# Patient Record
Sex: Female | Born: 1935
Health system: Southern US, Community
[De-identification: ages and names within clinical notes are randomized; demographics above are authoritative.]

## PROBLEM LIST (undated history)

## (undated) DIAGNOSIS — M1990 Unspecified osteoarthritis, unspecified site: Secondary | ICD-10-CM

## (undated) DIAGNOSIS — I10 Essential (primary) hypertension: Secondary | ICD-10-CM

## (undated) HISTORY — PX: TOTAL HIP ARTHROPLASTY: SHX124

---

## 2005-11-07 ENCOUNTER — Ambulatory Visit (HOSPITAL_COMMUNITY): Admission: RE | Admit: 2005-11-07 | Discharge: 2005-11-07 | Payer: Self-pay | Admitting: Internal Medicine

## 2005-12-09 ENCOUNTER — Ambulatory Visit (HOSPITAL_COMMUNITY): Admission: RE | Admit: 2005-12-09 | Discharge: 2005-12-09 | Payer: Self-pay | Admitting: Internal Medicine

## 2005-12-09 ENCOUNTER — Ambulatory Visit: Payer: Self-pay | Admitting: Internal Medicine

## 2006-05-05 HISTORY — PX: JOINT REPLACEMENT: SHX530

## 2006-11-19 ENCOUNTER — Ambulatory Visit (HOSPITAL_COMMUNITY): Admission: RE | Admit: 2006-11-19 | Discharge: 2006-11-19 | Payer: Self-pay | Admitting: Internal Medicine

## 2007-11-24 ENCOUNTER — Ambulatory Visit (HOSPITAL_COMMUNITY): Admission: RE | Admit: 2007-11-24 | Discharge: 2007-11-24 | Payer: Self-pay | Admitting: Internal Medicine

## 2008-01-17 ENCOUNTER — Other Ambulatory Visit: Admission: RE | Admit: 2008-01-17 | Discharge: 2008-01-17 | Payer: Self-pay | Admitting: Obstetrics & Gynecology

## 2008-11-27 ENCOUNTER — Ambulatory Visit (HOSPITAL_COMMUNITY): Admission: RE | Admit: 2008-11-27 | Discharge: 2008-11-27 | Payer: Self-pay | Admitting: Internal Medicine

## 2009-02-26 ENCOUNTER — Other Ambulatory Visit: Admission: RE | Admit: 2009-02-26 | Discharge: 2009-02-26 | Payer: Self-pay | Admitting: Obstetrics and Gynecology

## 2010-05-26 ENCOUNTER — Encounter: Payer: Self-pay | Admitting: Internal Medicine

## 2010-09-20 NOTE — Op Note (Signed)
NAME:  Carol Gutierrez, Carol Gutierrez             ACCOUNT NO.:  [id number]   MEDICAL RECORD NO.:  [id number]          PATIENT TYPE:  AMB   LOCATION:  DAY                           FACILITY:  [facility]   PHYSICIAN:  Lionel December, M.D.    DATE OF BIRTH:  10-22-1935   DATE OF PROCEDURE:  12/09/2005  DATE OF DISCHARGE:                                 OPERATIVE REPORT   PROCEDURE:  Colonoscopy.   INDICATIONS:  [name] is 75 year old Caucasian female who is undergoing  surveillance colonoscopy.  She has had two colonoscopies but 5 years ago six  weeks apart for removal of adenomatous polyp or polyps elsewhere.  She is  presently free of GI symptoms.  Family history is significant for colon  carcinoma in two sisters, one was in her early 20s and she is doing well and  the other sister was younger.   Procedure risks were reviewed with the patient, informed consent was  obtained.   MEDS FOR CONSCIOUS SEDATION:  Demerol 25 mg IV, Versed 4 mg IV.   FINDINGS:  Procedure performed in endoscopy suite.  The patient's vital  signs and O2 sat were monitored during procedure and remained stable.  The  patient was placed left lateral position and rectal examination performed.  No abnormality noted external or digital exam.  Olympus videoscope was  placed rectum and advanced under vision into sigmoid colon and beyond.  Preparation was excellent.  Few tiny diverticula were noted at sigmoid and  descending colon.  The scope was passed into cecum which was identified by  appendiceal orifice and ileocecal valve.  Pictures taken for the record.  As  the scope was withdrawn, colonic mucosa was examined for the second time and  was normal throughout.  Rectal mucosa similarly was normal.  Scope was  retroflexed to examine anorectal junction which was unremarkable.  Endoscope  was then withdrawn.  The patient tolerated the procedure well.   FINAL DIAGNOSIS:  Few tiny diverticula at sigmoid and descending colon.   No evidence  of recurrent polyps.   RECOMMENDATIONS:  High-fiber diet.  Yearly Hemoccults.  She should return  for next colonoscopy in 5 years from now.      Lionel December, M.D.  Electronically Signed     NR/MEDQ  D:  12/09/2005  T:  12/09/2005  Job:  [id number]   cc:   Kingsley Callander. Ouida Sills, MD  Fax: [phone]

## 2010-12-11 ENCOUNTER — Encounter (INDEPENDENT_AMBULATORY_CARE_PROVIDER_SITE_OTHER): Payer: Self-pay | Admitting: *Deleted

## 2010-12-19 ENCOUNTER — Ambulatory Visit (HOSPITAL_COMMUNITY)
Admission: RE | Admit: 2010-12-19 | Discharge: 2010-12-19 | Disposition: A | Discharge status: Home / Self Care | Payer: Medicare Other | Source: Ambulatory Visit | Attending: Internal Medicine | Admitting: Internal Medicine

## 2010-12-19 ENCOUNTER — Other Ambulatory Visit (HOSPITAL_COMMUNITY): Payer: Self-pay | Admitting: Internal Medicine

## 2010-12-19 DIAGNOSIS — M899 Disorder of bone, unspecified: Secondary | ICD-10-CM | POA: Insufficient documentation

## 2010-12-19 DIAGNOSIS — T148XXA Other injury of unspecified body region, initial encounter: Secondary | ICD-10-CM

## 2010-12-19 DIAGNOSIS — S59909A Unspecified injury of unspecified elbow, initial encounter: Secondary | ICD-10-CM | POA: Insufficient documentation

## 2010-12-19 DIAGNOSIS — S6990XA Unspecified injury of unspecified wrist, hand and finger(s), initial encounter: Secondary | ICD-10-CM | POA: Insufficient documentation

## 2010-12-19 DIAGNOSIS — W19XXXA Unspecified fall, initial encounter: Secondary | ICD-10-CM

## 2010-12-19 DIAGNOSIS — M25539 Pain in unspecified wrist: Secondary | ICD-10-CM | POA: Insufficient documentation

## 2010-12-19 DIAGNOSIS — M949 Disorder of cartilage, unspecified: Secondary | ICD-10-CM | POA: Insufficient documentation

## 2010-12-24 ENCOUNTER — Other Ambulatory Visit (HOSPITAL_COMMUNITY): Payer: Self-pay | Admitting: Internal Medicine

## 2010-12-24 DIAGNOSIS — Z139 Encounter for screening, unspecified: Secondary | ICD-10-CM

## 2011-01-02 ENCOUNTER — Ambulatory Visit (HOSPITAL_COMMUNITY)
Admission: RE | Admit: 2011-01-02 | Discharge: 2011-01-02 | Disposition: A | Discharge status: Home / Self Care | Payer: Medicare Other | Source: Ambulatory Visit | Attending: Internal Medicine | Admitting: Internal Medicine

## 2011-01-02 DIAGNOSIS — Z139 Encounter for screening, unspecified: Secondary | ICD-10-CM

## 2011-01-02 DIAGNOSIS — Z1231 Encounter for screening mammogram for malignant neoplasm of breast: Secondary | ICD-10-CM | POA: Insufficient documentation

## 2011-02-10 ENCOUNTER — Other Ambulatory Visit (HOSPITAL_COMMUNITY)
Admission: RE | Admit: 2011-02-10 | Discharge: 2011-02-10 | Disposition: A | Discharge status: Home / Self Care | Payer: Medicare Other | Source: Ambulatory Visit | Attending: Obstetrics and Gynecology | Admitting: Obstetrics and Gynecology

## 2011-02-10 ENCOUNTER — Other Ambulatory Visit: Payer: Self-pay | Admitting: Adult Health

## 2011-02-10 DIAGNOSIS — Z124 Encounter for screening for malignant neoplasm of cervix: Secondary | ICD-10-CM | POA: Insufficient documentation

## 2011-03-06 ENCOUNTER — Ambulatory Visit (INDEPENDENT_AMBULATORY_CARE_PROVIDER_SITE_OTHER): Payer: Medicare Other | Admitting: Otolaryngology

## 2011-03-06 DIAGNOSIS — K121 Other forms of stomatitis: Secondary | ICD-10-CM

## 2011-06-06 DIAGNOSIS — M999 Biomechanical lesion, unspecified: Secondary | ICD-10-CM | POA: Diagnosis not present

## 2011-06-06 DIAGNOSIS — M5030 Other cervical disc degeneration, unspecified cervical region: Secondary | ICD-10-CM | POA: Diagnosis not present

## 2011-06-06 DIAGNOSIS — M9981 Other biomechanical lesions of cervical region: Secondary | ICD-10-CM | POA: Diagnosis not present

## 2011-06-06 DIAGNOSIS — IMO0002 Reserved for concepts with insufficient information to code with codable children: Secondary | ICD-10-CM | POA: Diagnosis not present

## 2011-07-04 DIAGNOSIS — M5030 Other cervical disc degeneration, unspecified cervical region: Secondary | ICD-10-CM | POA: Diagnosis not present

## 2011-07-04 DIAGNOSIS — M999 Biomechanical lesion, unspecified: Secondary | ICD-10-CM | POA: Diagnosis not present

## 2011-07-04 DIAGNOSIS — IMO0002 Reserved for concepts with insufficient information to code with codable children: Secondary | ICD-10-CM | POA: Diagnosis not present

## 2011-07-04 DIAGNOSIS — M9981 Other biomechanical lesions of cervical region: Secondary | ICD-10-CM | POA: Diagnosis not present

## 2011-08-08 DIAGNOSIS — R079 Chest pain, unspecified: Secondary | ICD-10-CM | POA: Diagnosis not present

## 2011-08-12 ENCOUNTER — Other Ambulatory Visit (HOSPITAL_COMMUNITY): Payer: Self-pay | Admitting: Internal Medicine

## 2011-08-13 ENCOUNTER — Ambulatory Visit (HOSPITAL_COMMUNITY): Admission: RE | Admit: 2011-08-13 | Payer: Medicare Other | Source: Ambulatory Visit

## 2011-08-13 ENCOUNTER — Encounter (HOSPITAL_COMMUNITY): Payer: Medicare Other

## 2011-08-14 ENCOUNTER — Encounter (HOSPITAL_COMMUNITY): Payer: Medicare Other

## 2011-08-20 ENCOUNTER — Encounter (HOSPITAL_COMMUNITY): Payer: Self-pay | Admitting: Internal Medicine

## 2011-08-20 ENCOUNTER — Encounter (HOSPITAL_COMMUNITY)
Admission: RE | Admit: 2011-08-20 | Discharge: 2011-08-20 | Disposition: A | Discharge status: Home / Self Care | Payer: Medicare Other | Source: Ambulatory Visit | Attending: Internal Medicine | Admitting: Internal Medicine

## 2011-08-20 ENCOUNTER — Encounter (HOSPITAL_COMMUNITY): Payer: Medicare Other

## 2011-08-20 ENCOUNTER — Encounter (HOSPITAL_COMMUNITY): Payer: Self-pay

## 2011-08-20 ENCOUNTER — Ambulatory Visit (HOSPITAL_COMMUNITY)
Admission: RE | Admit: 2011-08-20 | Discharge: 2011-08-20 | Disposition: A | Discharge status: Home / Self Care | Payer: Medicare Other | Source: Ambulatory Visit | Attending: Internal Medicine | Admitting: Internal Medicine

## 2011-08-20 DIAGNOSIS — R079 Chest pain, unspecified: Secondary | ICD-10-CM | POA: Insufficient documentation

## 2011-08-20 HISTORY — DX: Essential (primary) hypertension: I10

## 2011-08-20 MED ORDER — TECHNETIUM TC 99M TETROFOSMIN IV KIT
10.0000 | PACK | Freq: Once | INTRAVENOUS | Status: AC | PRN
  Administered 2011-08-20: 10 via INTRAVENOUS

## 2011-08-20 MED ORDER — TECHNETIUM TC 99M TETROFOSMIN IV KIT
30.0000 | PACK | Freq: Once | INTRAVENOUS | Status: AC | PRN
  Administered 2011-08-20: 30 via INTRAVENOUS

## 2011-08-20 NOTE — Progress Notes (Signed)
NAMEITHA, KROEKER NO.:  [id number]  MEDICAL RECORD NO.:  [id number]  LOCATION:  [facility]                          FACILITY:  [facility]  PHYSICIAN:  Kingsley Callander. Ouida Sills, MD       DATE OF BIRTH:  1935-06-25  DATE OF PROCEDURE: DATE OF DISCHARGE:                                PROGRESS NOTE   Ms. Snedeker underwent a Myoview stress test for evaluation of recent symptoms of chest pain.  She exercised 7 minutes 22 seconds (1 minute 22 seconds into stage III of the Bruce protocol) attaining a maximal heart rate of 132 (90% of the age predicted maximal heart rate) at a workload of 10.1 mets and discontinued exercise due to shortness of breath. There were no symptoms of chest pain.  There were no arrhythmias.  She had minor ST-segment changes in late recovery in the inferolateral leads.  There was a hypertensive blood pressure response to exercise reaching a peak of 220/82 in early recovery.  The baseline EKG revealed normal sinus rhythm/sinus bradycardia at 49 beats per minute.  IMPRESSION:  No definite evidence of exercise-induced ischemia.  Myoview images pending.     Kingsley Callander. Ouida Sills, MD     ROF/MEDQ  D:  08/20/2011  T:  08/20/2011  Job:  [id number]

## 2011-08-20 NOTE — Progress Notes (Signed)
Stress Lab Nurses Notes - Carol Gutierrez  Carol Gutierrez 08/20/2011 Reason for doing test: Chest Pain Type of test: Stress Myoview / one day study Nurse performing test: Parke Poisson, RN Nuclear Medicine Tech: Lyndel Pleasure Echo Tech: Not Applicable MD performing test: R. Ouida Sills Family MD: Ouida Sills Test explained and consent signed: yes IV started: 22g jelco, Saline lock flushed, IV in progress from floor and Saline lock started in radiology Symptoms: mild SOB & fatigue Treatment/Intervention: None Reason test stopped: fatigue After recovery IV was: Discontinued via X-ray tech and No redness or edema Patient to return to Nuc. Med at : 13:20 Patient discharged: Home Patient's Condition upon discharge was: stable Comments: During test peak BP 220/82 & HR 132.  Recovery BP 152/78 & HR 74.  Symptoms resolved in recovery. Erskine Speed T

## 2011-08-21 ENCOUNTER — Ambulatory Visit (HOSPITAL_COMMUNITY): Payer: Medicare Other

## 2011-08-21 ENCOUNTER — Encounter (HOSPITAL_COMMUNITY): Payer: Medicare Other

## 2011-08-22 ENCOUNTER — Encounter (HOSPITAL_COMMUNITY): Payer: Medicare Other

## 2011-08-25 DIAGNOSIS — Z79899 Other long term (current) drug therapy: Secondary | ICD-10-CM | POA: Diagnosis not present

## 2011-08-25 DIAGNOSIS — E785 Hyperlipidemia, unspecified: Secondary | ICD-10-CM | POA: Diagnosis not present

## 2011-08-25 DIAGNOSIS — I10 Essential (primary) hypertension: Secondary | ICD-10-CM | POA: Diagnosis not present

## 2011-09-01 DIAGNOSIS — R079 Chest pain, unspecified: Secondary | ICD-10-CM | POA: Diagnosis not present

## 2011-09-01 DIAGNOSIS — I10 Essential (primary) hypertension: Secondary | ICD-10-CM | POA: Diagnosis not present

## 2011-09-01 DIAGNOSIS — E785 Hyperlipidemia, unspecified: Secondary | ICD-10-CM | POA: Diagnosis not present

## 2012-03-04 DIAGNOSIS — I10 Essential (primary) hypertension: Secondary | ICD-10-CM | POA: Diagnosis not present

## 2012-08-02 ENCOUNTER — Telehealth (INDEPENDENT_AMBULATORY_CARE_PROVIDER_SITE_OTHER): Payer: Self-pay | Admitting: *Deleted

## 2012-08-02 ENCOUNTER — Other Ambulatory Visit (INDEPENDENT_AMBULATORY_CARE_PROVIDER_SITE_OTHER): Payer: Self-pay | Admitting: *Deleted

## 2012-08-02 DIAGNOSIS — Z86010 Personal history of colonic polyps: Secondary | ICD-10-CM

## 2012-08-02 DIAGNOSIS — Z1211 Encounter for screening for malignant neoplasm of colon: Secondary | ICD-10-CM

## 2012-08-02 DIAGNOSIS — Z800 Family history of malignant neoplasm of digestive organs: Secondary | ICD-10-CM

## 2012-08-02 NOTE — Telephone Encounter (Signed)
Patient needs movi prep 

## 2012-08-03 MED ORDER — PEG-KCL-NACL-NASULF-NA ASC-C 100 G PO SOLR: 1.0000 | Freq: Once | ORAL | Status: DC

## 2012-08-11 ENCOUNTER — Telehealth (INDEPENDENT_AMBULATORY_CARE_PROVIDER_SITE_OTHER): Payer: Self-pay | Admitting: *Deleted

## 2012-08-11 NOTE — Telephone Encounter (Signed)
  Procedure: tcs  Reason/Indication:  Hx polyps, fam hx colon ca  Has patient had this procedure before?  Yes, 12/2005 (EPIC)  If so, when, by whom and where?    Is there a family history of colon cancer?  Yes, 2 sisters  Who?  What age when diagnosed?    Is patient diabetic?   no      Does patient have prosthetic heart valve?  no  Do you have a pacemaker?  no  Has patient ever had endocarditis? no  Has patient had joint replacement within last 12 months?  no  Is patient on Coumadin, Plavix and/or Aspirin? no  Medications: non4  Allergies: nkda  Medication Adjustment:   Procedure date & time: 09/08/12 at 830

## 2012-08-11 NOTE — Telephone Encounter (Signed)
  Procedure: tcs  Reason/Indication:  Hx polyps, fam hx colon ca  Has patient had this procedure before?  Yes, 12/2005 (EPIC)  If so, when, by whom and where?    Is there a family history of colon cancer?  Yes, 2 sisters  Who?  What age when diagnosed?    Is patient diabetic?   no      Does patient have prosthetic heart valve?  no  Do you have a pacemaker?  no  Has patient ever had endocarditis? no  Has patient had joint replacement within last 12 months?  no  Is patient on Coumadin, Plavix and/or Aspirin? no  Medications: none  Allergies: nkda  Medication Adjustment:   Procedure date & time: 09/08/12 at 830

## 2012-08-12 NOTE — Telephone Encounter (Signed)
agree

## 2012-08-30 ENCOUNTER — Encounter (HOSPITAL_COMMUNITY): Payer: Self-pay | Admitting: Pharmacy Technician

## 2012-09-02 DIAGNOSIS — Z79899 Other long term (current) drug therapy: Secondary | ICD-10-CM | POA: Diagnosis not present

## 2012-09-02 DIAGNOSIS — E785 Hyperlipidemia, unspecified: Secondary | ICD-10-CM | POA: Diagnosis not present

## 2012-09-02 DIAGNOSIS — I10 Essential (primary) hypertension: Secondary | ICD-10-CM | POA: Diagnosis not present

## 2012-09-02 DIAGNOSIS — M1990 Unspecified osteoarthritis, unspecified site: Secondary | ICD-10-CM | POA: Diagnosis not present

## 2012-09-06 DIAGNOSIS — Z79899 Other long term (current) drug therapy: Secondary | ICD-10-CM | POA: Diagnosis not present

## 2012-09-06 DIAGNOSIS — E785 Hyperlipidemia, unspecified: Secondary | ICD-10-CM | POA: Diagnosis not present

## 2012-09-06 DIAGNOSIS — M1990 Unspecified osteoarthritis, unspecified site: Secondary | ICD-10-CM | POA: Diagnosis not present

## 2012-09-06 DIAGNOSIS — I10 Essential (primary) hypertension: Secondary | ICD-10-CM | POA: Diagnosis not present

## 2012-09-08 ENCOUNTER — Encounter (HOSPITAL_COMMUNITY)
Admission: RE | Disposition: A | Discharge status: Home / Self Care | Payer: Self-pay | Source: Ambulatory Visit | Attending: Internal Medicine

## 2012-09-08 ENCOUNTER — Ambulatory Visit (HOSPITAL_COMMUNITY)
Admission: RE | Admit: 2012-09-08 | Discharge: 2012-09-08 | Disposition: A | Discharge status: Home / Self Care | Payer: Medicare Other | Source: Ambulatory Visit | Attending: Internal Medicine | Admitting: Internal Medicine

## 2012-09-08 ENCOUNTER — Encounter (HOSPITAL_COMMUNITY): Payer: Self-pay | Admitting: *Deleted

## 2012-09-08 DIAGNOSIS — Z860100 Personal history of colon polyps, unspecified: Secondary | ICD-10-CM | POA: Insufficient documentation

## 2012-09-08 DIAGNOSIS — K644 Residual hemorrhoidal skin tags: Secondary | ICD-10-CM | POA: Insufficient documentation

## 2012-09-08 DIAGNOSIS — Z800 Family history of malignant neoplasm of digestive organs: Secondary | ICD-10-CM

## 2012-09-08 DIAGNOSIS — D126 Benign neoplasm of colon, unspecified: Secondary | ICD-10-CM | POA: Insufficient documentation

## 2012-09-08 DIAGNOSIS — K5520 Angiodysplasia of colon without hemorrhage: Secondary | ICD-10-CM | POA: Diagnosis not present

## 2012-09-08 DIAGNOSIS — K5730 Diverticulosis of large intestine without perforation or abscess without bleeding: Secondary | ICD-10-CM | POA: Diagnosis not present

## 2012-09-08 DIAGNOSIS — I10 Essential (primary) hypertension: Secondary | ICD-10-CM | POA: Insufficient documentation

## 2012-09-08 DIAGNOSIS — K6389 Other specified diseases of intestine: Secondary | ICD-10-CM

## 2012-09-08 HISTORY — DX: Unspecified osteoarthritis, unspecified site: M19.90

## 2012-09-08 HISTORY — PX: COLONOSCOPY: SHX5424

## 2012-09-08 SURGERY — COLONOSCOPY
Anesthesia: Moderate Sedation

## 2012-09-08 MED ORDER — MEPERIDINE HCL 50 MG/ML IJ SOLN
INTRAMUSCULAR | Status: DC | PRN
  Administered 2012-09-08: 25 mg via INTRAVENOUS

## 2012-09-08 MED ORDER — MIDAZOLAM HCL 5 MG/5ML IJ SOLN
INTRAMUSCULAR | Status: AC
  Filled 2012-09-08: qty 10

## 2012-09-08 MED ORDER — MIDAZOLAM HCL 5 MG/5ML IJ SOLN
INTRAMUSCULAR | Status: DC | PRN
  Administered 2012-09-08: 2 mg via INTRAVENOUS

## 2012-09-08 MED ORDER — SODIUM CHLORIDE 0.9 % IV SOLN
INTRAVENOUS | Status: DC
  Administered 2012-09-08: 1000 mL via INTRAVENOUS

## 2012-09-08 MED ORDER — MEPERIDINE HCL 50 MG/ML IJ SOLN
INTRAMUSCULAR | Status: AC
  Filled 2012-09-08: qty 1

## 2012-09-08 NOTE — H&P (Signed)
Carol Gutierrez is an 77 y.o. female.   Chief Complaint: Patient is here for colonoscopy. HPI: Patient is a 77 year old Caucasian female with history of colonic adenomas and is for colonoscopy. Patient's last exam was in August 2007. She denies abdominal pain change in bowel habits or rectal bleeding. Family history significant for colon carcinoma in her sister in her 28s and she is doing well.  Past Medical History  Diagnosis Date  . Hypertension   . Arthritis     hands and back    Past Surgical History  Procedure Laterality Date  . Joint replacement  2008    left hip    Family History  Problem Relation Age of Onset  . Arthritis Mother   . Diabetes type II Father   . CAD Father   . Colon cancer Sister   . Hypertension Brother    Social History:  reports that she has never smoked. She does not have any smokeless tobacco history on file. She reports that  drinks alcohol. She reports that she does not use illicit drugs.  Allergies: No Known Allergies  Medications Prior to Admission  Medication Sig Dispense Refill  . aspirin 325 MG tablet Take 325 mg by mouth as needed for pain.      . peg 3350 powder (MOVIPREP) 100 G SOLR Take 1 kit (100 g total) by mouth once.  1 kit  0    No results found for this or any previous visit (from the past 48 hour(s)). No results found.  ROS  Blood pressure 166/86, pulse 63, temperature 98.1 F (36.7 C), temperature source Oral, resp. rate 18, height 5\' 3"  (1.6 m), weight 144 lb 8 oz (65.545 kg), SpO2 97.00%. Physical Exam  Constitutional: She appears well-developed and well-nourished.  HENT:  Mouth/Throat: Oropharynx is clear and moist.  Eyes: Conjunctivae are normal. No scleral icterus.  Neck: No thyromegaly present.  Cardiovascular: Normal rate, regular rhythm and normal heart sounds.   No murmur heard. Respiratory: Effort normal and breath sounds normal.  GI: Soft. She exhibits no distension and no mass. There is no tenderness.   Musculoskeletal: She exhibits no edema.  Lymphadenopathy:    She has no cervical adenopathy.  Neurological: She is alert.  Skin: Skin is warm and dry.     Assessment/Plan History of colonic adenomas. Family history of colon carcinoma. Surveillance colonoscopy.  Evalette Montrose U 09/08/2012, 8:22 AM

## 2012-09-08 NOTE — Op Note (Signed)
COLONOSCOPY PROCEDURE REPORT  PATIENT:  Carol Gutierrez  MR#:  [id number] Birthdate:  Jun 17, 1935, 77 y.o., female Endoscopist:  Dr. Malissa Hippo, MD Referred By:  Dr. Carylon Perches, MD  Procedure Date: 09/08/2012  Procedure:   Colonoscopy  Indications:  Patient is a 77 year old Caucasian female with history of colonic adenomas and family history of colon carcinoma in a sister. Patient's last exam was in August 2007.  Informed Consent:  The procedure and risks were reviewed with the patient and informed consent was obtained.  Medications:  Demerol 25 mg IV Versed 3 mg IV  Description of procedure:  After a digital rectal exam was performed, that colonoscope was advanced from the anus through the rectum and colon to the area of the cecum, ileocecal valve and appendiceal orifice. The cecum was deeply intubated. These structures were well-seen and photographed for the record. From the level of the cecum and ileocecal valve, the scope was slowly and cautiously withdrawn. The mucosal surfaces were carefully surveyed utilizing scope tip to flexion to facilitate fold flattening as needed. The scope was pulled down into the rectum where a thorough exam including retroflexion was performed.  Findings:   Prep excellent. 2 small polyps that were cold biopsied from cecum. Lobular ileocecal valve possibly secondary to prolapsed mucosa. Biopsy was taken for histologic confirmation. 8-9 mm submucosal lipoma at ascending colon. Single small AV malformation in the hepatic flexure. Moderate number of diverticula at the sigmoid colon. Normal rectal mucosa. Small hemorrhoids below the dentate line and single anal papilla.    Therapeutic/Diagnostic Maneuvers Performed:  See above  Complications:  None  Cecal Withdrawal Time:  19 minutes  Impression:  Examination performed to cecum. Two small cecal polyps ablated via cold biopsy and submitted together. Lobulated appearance of ileocecal valve possibly  secondary to prolapsed mucosa. Biopsy taken for histologic confirmation. Moderate number of diverticula and sigmoid colon. Small external hemorrhoids and single anal papilla.  Recommendations:  Standard instructions given. High fiber diet. I will contact patient with biopsy results and further recommendations.  Talha Iser U  09/08/2012 9:04 AM  CC: Dr. Carylon Perches, MD & Dr. Bonnetta Barry ref. provider found

## 2012-09-10 ENCOUNTER — Encounter (HOSPITAL_COMMUNITY): Payer: Self-pay | Admitting: Internal Medicine

## 2012-09-14 ENCOUNTER — Encounter (INDEPENDENT_AMBULATORY_CARE_PROVIDER_SITE_OTHER): Payer: Self-pay | Admitting: *Deleted

## 2012-09-14 DIAGNOSIS — E785 Hyperlipidemia, unspecified: Secondary | ICD-10-CM | POA: Diagnosis not present

## 2012-09-14 DIAGNOSIS — M1990 Unspecified osteoarthritis, unspecified site: Secondary | ICD-10-CM | POA: Diagnosis not present

## 2012-09-14 DIAGNOSIS — D352 Benign neoplasm of pituitary gland: Secondary | ICD-10-CM | POA: Diagnosis not present

## 2012-12-22 DIAGNOSIS — H2510 Age-related nuclear cataract, unspecified eye: Secondary | ICD-10-CM | POA: Diagnosis not present

## 2013-01-11 DIAGNOSIS — H2510 Age-related nuclear cataract, unspecified eye: Secondary | ICD-10-CM | POA: Diagnosis not present

## 2013-01-13 DIAGNOSIS — H2510 Age-related nuclear cataract, unspecified eye: Secondary | ICD-10-CM | POA: Diagnosis not present

## 2013-01-13 DIAGNOSIS — H52209 Unspecified astigmatism, unspecified eye: Secondary | ICD-10-CM | POA: Diagnosis not present

## 2013-01-13 DIAGNOSIS — H269 Unspecified cataract: Secondary | ICD-10-CM | POA: Diagnosis not present

## 2013-03-08 DIAGNOSIS — H26499 Other secondary cataract, unspecified eye: Secondary | ICD-10-CM | POA: Diagnosis not present

## 2013-03-08 DIAGNOSIS — Z961 Presence of intraocular lens: Secondary | ICD-10-CM | POA: Diagnosis not present

## 2013-03-08 DIAGNOSIS — H538 Other visual disturbances: Secondary | ICD-10-CM | POA: Diagnosis not present

## 2013-03-08 DIAGNOSIS — H524 Presbyopia: Secondary | ICD-10-CM | POA: Diagnosis not present

## 2013-03-08 DIAGNOSIS — H2510 Age-related nuclear cataract, unspecified eye: Secondary | ICD-10-CM | POA: Diagnosis not present

## 2013-03-08 DIAGNOSIS — H35039 Hypertensive retinopathy, unspecified eye: Secondary | ICD-10-CM | POA: Diagnosis not present

## 2013-03-22 DIAGNOSIS — H269 Unspecified cataract: Secondary | ICD-10-CM | POA: Diagnosis not present

## 2013-03-22 DIAGNOSIS — H2510 Age-related nuclear cataract, unspecified eye: Secondary | ICD-10-CM | POA: Diagnosis not present

## 2013-07-16 DIAGNOSIS — H40019 Open angle with borderline findings, low risk, unspecified eye: Secondary | ICD-10-CM | POA: Diagnosis not present

## 2013-07-16 DIAGNOSIS — D3190 Benign neoplasm of unspecified part of unspecified eye: Secondary | ICD-10-CM | POA: Diagnosis not present

## 2013-10-21 DIAGNOSIS — H35039 Hypertensive retinopathy, unspecified eye: Secondary | ICD-10-CM | POA: Diagnosis not present

## 2013-10-21 DIAGNOSIS — I7090 Unspecified atherosclerosis: Secondary | ICD-10-CM | POA: Diagnosis not present

## 2013-10-21 DIAGNOSIS — H40019 Open angle with borderline findings, low risk, unspecified eye: Secondary | ICD-10-CM | POA: Diagnosis not present

## 2013-10-21 DIAGNOSIS — Z961 Presence of intraocular lens: Secondary | ICD-10-CM | POA: Diagnosis not present

## 2013-11-03 DIAGNOSIS — H26499 Other secondary cataract, unspecified eye: Secondary | ICD-10-CM | POA: Diagnosis not present

## 2014-04-07 DIAGNOSIS — I10 Essential (primary) hypertension: Secondary | ICD-10-CM | POA: Diagnosis not present

## 2014-04-07 DIAGNOSIS — Z79899 Other long term (current) drug therapy: Secondary | ICD-10-CM | POA: Diagnosis not present

## 2014-04-07 DIAGNOSIS — E785 Hyperlipidemia, unspecified: Secondary | ICD-10-CM | POA: Diagnosis not present

## 2014-04-14 ENCOUNTER — Other Ambulatory Visit (HOSPITAL_COMMUNITY): Payer: Self-pay | Admitting: Internal Medicine

## 2014-04-14 DIAGNOSIS — Z23 Encounter for immunization: Secondary | ICD-10-CM | POA: Diagnosis not present

## 2014-04-14 DIAGNOSIS — Z1231 Encounter for screening mammogram for malignant neoplasm of breast: Secondary | ICD-10-CM

## 2014-04-14 DIAGNOSIS — E785 Hyperlipidemia, unspecified: Secondary | ICD-10-CM | POA: Diagnosis not present

## 2014-04-14 DIAGNOSIS — M19049 Primary osteoarthritis, unspecified hand: Secondary | ICD-10-CM | POA: Diagnosis not present

## 2014-04-24 ENCOUNTER — Ambulatory Visit (HOSPITAL_COMMUNITY)
Admission: RE | Admit: 2014-04-24 | Discharge: 2014-04-24 | Disposition: A | Discharge status: Home / Self Care | Payer: Medicare Other | Source: Ambulatory Visit | Attending: Internal Medicine | Admitting: Internal Medicine

## 2014-04-24 DIAGNOSIS — Z1231 Encounter for screening mammogram for malignant neoplasm of breast: Secondary | ICD-10-CM | POA: Insufficient documentation

## 2014-05-08 DIAGNOSIS — H5711 Ocular pain, right eye: Secondary | ICD-10-CM | POA: Diagnosis not present

## 2014-05-08 DIAGNOSIS — H40013 Open angle with borderline findings, low risk, bilateral: Secondary | ICD-10-CM | POA: Diagnosis not present

## 2014-05-08 DIAGNOSIS — Z961 Presence of intraocular lens: Secondary | ICD-10-CM | POA: Diagnosis not present

## 2014-05-08 DIAGNOSIS — H26492 Other secondary cataract, left eye: Secondary | ICD-10-CM | POA: Diagnosis not present

## 2014-08-15 DIAGNOSIS — H01003 Unspecified blepharitis right eye, unspecified eyelid: Secondary | ICD-10-CM | POA: Diagnosis not present

## 2014-08-15 DIAGNOSIS — H01006 Unspecified blepharitis left eye, unspecified eyelid: Secondary | ICD-10-CM | POA: Diagnosis not present

## 2014-08-15 DIAGNOSIS — H40013 Open angle with borderline findings, low risk, bilateral: Secondary | ICD-10-CM | POA: Diagnosis not present

## 2014-11-30 ENCOUNTER — Emergency Department (HOSPITAL_COMMUNITY): Payer: Medicare Other

## 2014-11-30 ENCOUNTER — Encounter (HOSPITAL_COMMUNITY): Payer: Self-pay

## 2014-11-30 ENCOUNTER — Emergency Department (HOSPITAL_COMMUNITY)
Admission: EM | Admit: 2014-11-30 | Discharge: 2014-11-30 | Disposition: A | Discharge status: Other Acute Inpatient Hospital | Payer: Medicare Other | Attending: Emergency Medicine | Admitting: Emergency Medicine

## 2014-11-30 DIAGNOSIS — Z7982 Long term (current) use of aspirin: Secondary | ICD-10-CM | POA: Diagnosis not present

## 2014-11-30 DIAGNOSIS — S199XXA Unspecified injury of neck, initial encounter: Secondary | ICD-10-CM | POA: Diagnosis not present

## 2014-11-30 DIAGNOSIS — S82142A Displaced bicondylar fracture of left tibia, initial encounter for closed fracture: Secondary | ICD-10-CM

## 2014-11-30 DIAGNOSIS — S0990XA Unspecified injury of head, initial encounter: Secondary | ICD-10-CM | POA: Diagnosis not present

## 2014-11-30 DIAGNOSIS — S8265XA Nondisplaced fracture of lateral malleolus of left fibula, initial encounter for closed fracture: Secondary | ICD-10-CM | POA: Diagnosis not present

## 2014-11-30 DIAGNOSIS — M1990 Unspecified osteoarthritis, unspecified site: Secondary | ICD-10-CM | POA: Insufficient documentation

## 2014-11-30 DIAGNOSIS — S0181XA Laceration without foreign body of other part of head, initial encounter: Secondary | ICD-10-CM | POA: Insufficient documentation

## 2014-11-30 DIAGNOSIS — S82832A Other fracture of upper and lower end of left fibula, initial encounter for closed fracture: Secondary | ICD-10-CM | POA: Diagnosis not present

## 2014-11-30 DIAGNOSIS — W19XXXA Unspecified fall, initial encounter: Secondary | ICD-10-CM

## 2014-11-30 DIAGNOSIS — W01198A Fall on same level from slipping, tripping and stumbling with subsequent striking against other object, initial encounter: Secondary | ICD-10-CM | POA: Insufficient documentation

## 2014-11-30 DIAGNOSIS — Y9389 Activity, other specified: Secondary | ICD-10-CM | POA: Insufficient documentation

## 2014-11-30 DIAGNOSIS — S8262XA Displaced fracture of lateral malleolus of left fibula, initial encounter for closed fracture: Secondary | ICD-10-CM | POA: Diagnosis present

## 2014-11-30 DIAGNOSIS — S82102A Unspecified fracture of upper end of left tibia, initial encounter for closed fracture: Secondary | ICD-10-CM | POA: Diagnosis not present

## 2014-11-30 DIAGNOSIS — Y9289 Other specified places as the place of occurrence of the external cause: Secondary | ICD-10-CM | POA: Insufficient documentation

## 2014-11-30 DIAGNOSIS — S42292A Other displaced fracture of upper end of left humerus, initial encounter for closed fracture: Secondary | ICD-10-CM | POA: Diagnosis not present

## 2014-11-30 DIAGNOSIS — S299XXA Unspecified injury of thorax, initial encounter: Secondary | ICD-10-CM | POA: Diagnosis not present

## 2014-11-30 DIAGNOSIS — S79912A Unspecified injury of left hip, initial encounter: Secondary | ICD-10-CM | POA: Diagnosis not present

## 2014-11-30 DIAGNOSIS — Z96642 Presence of left artificial hip joint: Secondary | ICD-10-CM | POA: Diagnosis not present

## 2014-11-30 DIAGNOSIS — S82202A Unspecified fracture of shaft of left tibia, initial encounter for closed fracture: Secondary | ICD-10-CM | POA: Insufficient documentation

## 2014-11-30 DIAGNOSIS — I10 Essential (primary) hypertension: Secondary | ICD-10-CM | POA: Diagnosis present

## 2014-11-30 DIAGNOSIS — M7989 Other specified soft tissue disorders: Secondary | ICD-10-CM | POA: Diagnosis not present

## 2014-11-30 DIAGNOSIS — Z9989 Dependence on other enabling machines and devices: Secondary | ICD-10-CM | POA: Diagnosis not present

## 2014-11-30 DIAGNOSIS — T148 Other injury of unspecified body region: Secondary | ICD-10-CM | POA: Diagnosis not present

## 2014-11-30 DIAGNOSIS — M25552 Pain in left hip: Secondary | ICD-10-CM | POA: Diagnosis not present

## 2014-11-30 DIAGNOSIS — Y998 Other external cause status: Secondary | ICD-10-CM | POA: Insufficient documentation

## 2014-11-30 DIAGNOSIS — S42252A Displaced fracture of greater tuberosity of left humerus, initial encounter for closed fracture: Secondary | ICD-10-CM | POA: Diagnosis not present

## 2014-11-30 DIAGNOSIS — S42202A Unspecified fracture of upper end of left humerus, initial encounter for closed fracture: Secondary | ICD-10-CM | POA: Diagnosis present

## 2014-11-30 DIAGNOSIS — S82252A Displaced comminuted fracture of shaft of left tibia, initial encounter for closed fracture: Secondary | ICD-10-CM | POA: Diagnosis not present

## 2014-11-30 DIAGNOSIS — S42302A Unspecified fracture of shaft of humerus, left arm, initial encounter for closed fracture: Secondary | ICD-10-CM

## 2014-11-30 DIAGNOSIS — S42352A Displaced comminuted fracture of shaft of humerus, left arm, initial encounter for closed fracture: Secondary | ICD-10-CM | POA: Diagnosis not present

## 2014-11-30 DIAGNOSIS — Z043 Encounter for examination and observation following other accident: Secondary | ICD-10-CM | POA: Diagnosis not present

## 2014-11-30 DIAGNOSIS — D62 Acute posthemorrhagic anemia: Secondary | ICD-10-CM | POA: Diagnosis not present

## 2014-11-30 DIAGNOSIS — Z9981 Dependence on supplemental oxygen: Secondary | ICD-10-CM | POA: Diagnosis not present

## 2014-11-30 DIAGNOSIS — Z01810 Encounter for preprocedural cardiovascular examination: Secondary | ICD-10-CM | POA: Diagnosis not present

## 2014-11-30 DIAGNOSIS — M25512 Pain in left shoulder: Secondary | ICD-10-CM | POA: Diagnosis not present

## 2014-11-30 LAB — CBC
HEMATOCRIT: 35.2 % — AB (ref 36.0–46.0)
Hemoglobin: 11.4 g/dL — ABNORMAL LOW (ref 12.0–15.0)
MCH: 26.2 pg (ref 26.0–34.0)
MCHC: 32.4 g/dL (ref 30.0–36.0)
MCV: 80.9 fL (ref 78.0–100.0)
PLATELETS: 176 10*3/uL (ref 150–400)
RBC: 4.35 MIL/uL (ref 3.87–5.11)
RDW: 14.2 % (ref 11.5–15.5)
WBC: 11.4 10*3/uL — ABNORMAL HIGH (ref 4.0–10.5)

## 2014-11-30 LAB — BASIC METABOLIC PANEL
Anion gap: 10 (ref 5–15)
BUN: 14 mg/dL (ref 6–20)
CALCIUM: 8.7 mg/dL — AB (ref 8.9–10.3)
CHLORIDE: 102 mmol/L (ref 101–111)
CO2: 25 mmol/L (ref 22–32)
Creatinine, Ser: 0.95 mg/dL (ref 0.44–1.00)
GFR calc non Af Amer: 57 mL/min — ABNORMAL LOW (ref >60)
GLUCOSE: 143 mg/dL — AB (ref 65–99)
Potassium: 3.5 mmol/L (ref 3.5–5.1)
SODIUM: 137 mmol/L (ref 135–145)

## 2014-11-30 LAB — TYPE AND SCREEN
ABO/RH(D): A POS
Antibody Screen: NEGATIVE

## 2014-11-30 MED ORDER — FENTANYL CITRATE (PF) 100 MCG/2ML IJ SOLN
50.0000 ug | Freq: Once | INTRAMUSCULAR | Status: AC
  Administered 2014-11-30: 50 ug via INTRAVENOUS
  Filled 2014-11-30: qty 2

## 2014-11-30 MED ORDER — ONDANSETRON HCL 4 MG/2ML IJ SOLN: 4.0000 mg | Freq: Once | INTRAMUSCULAR | Status: DC

## 2014-11-30 MED ORDER — ONDANSETRON HCL 4 MG/2ML IJ SOLN
INTRAMUSCULAR | Status: AC
  Filled 2014-11-30: qty 2

## 2014-11-30 MED ORDER — FENTANYL CITRATE (PF) 100 MCG/2ML IJ SOLN
25.0000 ug | Freq: Once | INTRAMUSCULAR | Status: AC
  Administered 2014-11-30: 25 ug via INTRAVENOUS

## 2014-11-30 MED ORDER — ONDANSETRON HCL 4 MG/2ML IJ SOLN: 4.0000 mg | Freq: Once | INTRAMUSCULAR | Status: AC

## 2014-11-30 MED ORDER — ONDANSETRON HCL 4 MG/2ML IJ SOLN
INTRAMUSCULAR | Status: AC
Start: 2014-11-30 — End: 2014-11-30
  Administered 2014-11-30: 4 mg via INTRAVENOUS
  Filled 2014-11-30: qty 2

## 2014-11-30 MED ORDER — LIDOCAINE-EPINEPHRINE (PF) 1 %-1:200000 IJ SOLN
10.0000 mL | Freq: Once | INTRAMUSCULAR | Status: DC
  Filled 2014-11-30: qty 10

## 2014-11-30 MED ORDER — FENTANYL CITRATE (PF) 100 MCG/2ML IJ SOLN
INTRAMUSCULAR | Status: AC
  Filled 2014-11-30: qty 2

## 2014-11-30 MED ORDER — MORPHINE SULFATE 4 MG/ML IJ SOLN
2.0000 mg | Freq: Once | INTRAMUSCULAR | Status: AC
  Administered 2014-11-30: 2 mg via INTRAVENOUS
  Filled 2014-11-30: qty 1

## 2014-11-30 NOTE — ED Notes (Signed)
Spoke with [facility] emergency dept at [place], report given

## 2014-11-30 NOTE — ED Provider Notes (Signed)
CSN: [id number]     Arrival date & time 11/30/14  1538 History   First MD Initiated Contact with Patient 11/30/14 1538     Chief Complaint  Patient presents with  . Fall     (Consider location/radiation/quality/duration/timing/severity/associated sxs/prior Treatment) HPI  Pt tripped and fell just pta at store on sidewalk - fell and struck her L shoulder, L hip and L knee and had too much pain to get up by herself - was transported by EMS with BB and CC - she had mild bleeding from Lac to the L forehead - but no LOC, no neck pain, no numbness or weakness and no seizure / n/v.  She has pain in the L knee, L hip and L arm - worse with ROM.  She is UTD on tdap.  Sx are constant,. Moderate and worse with ROM.  Past Medical History  Diagnosis Date  . Hypertension   . Arthritis     hands and back   Past Surgical History  Procedure Laterality Date  . Joint replacement  2008    left hip  . Colonoscopy N/A 09/08/2012    Procedure: COLONOSCOPY;  Surgeon: Rogene Houston, MD;  Location: [facility];  Service: Endoscopy;  Laterality: N/A;  730-moved to 830 Ann to notify pt  . Total hip arthroplasty Left Left Hip Replacement   Family History  Problem Relation Age of Onset  . Arthritis Mother   . Diabetes type II Father   . CAD Father   . Colon cancer Sister   . Hypertension Brother    History  Substance Use Topics  . Smoking status: Never Smoker   . Smokeless tobacco: Not on file  . Alcohol Use: Yes     Comment: rare   OB History    No data available     Review of Systems  All other systems reviewed and are negative.     Allergies  Review of patient's allergies indicates no known allergies.  Home Medications   Prior to Admission medications   Medication Sig Start Date End Date Taking? Authorizing Provider  aspirin 325 MG tablet Take 325 mg by mouth as needed for mild pain or moderate pain.    Yes Historical Provider, MD   BP 154/87 mmHg  Pulse 71  Temp(Src) 98.1 F  (36.7 C) (Oral)  Resp 20  Ht 5\' 3"  (1.6 m)  Wt 147 lb (66.679 kg)  BMI 26.05 kg/m2  SpO2 94% Physical Exam  Constitutional: She appears well-developed and well-nourished. No distress.  HENT:  Head: Normocephalic.  Mouth/Throat: Oropharynx is clear and moist. No oropharyngeal exudate.  Laceration to the L forehead - 2 cm in length.  Minimal bleeding, no underlying FB or vessels / nerves seen.  Hematoma to the forehead in the hairline  Eyes: Conjunctivae and EOM are normal. Pupils are equal, round, and reactive to light. Right eye exhibits no discharge. Left eye exhibits no discharge. No scleral icterus.  Neck: Normal range of motion. Neck supple. No JVD present. No thyromegaly present.  FROM of the neck without any pain - no ttp over the posterior neck.  Cardiovascular: Normal rate, regular rhythm, normal heart sounds and intact distal pulses.  Exam reveals no gallop and no friction rub.   No murmur heard. Pulmonary/Chest: Effort normal and breath sounds normal. No respiratory distress. She has no wheezes. She has no rales.  Abdominal: Soft. Bowel sounds are normal. She exhibits no distension and no mass. There is no tenderness.  Musculoskeletal: She exhibits tenderness ( ttp over teh L prox humerus and the L knee - can straight leg raise.  Dec ROM of the L shoulder, knee and ankle - normal ROM of L hip.). She exhibits no edema.  Lymphadenopathy:    She has no cervical adenopathy.  Neurological: She is alert. Coordination normal.  Able to straight leg raise on L but with severe knee pain.  Normal speech, coordination and sensation.  Normal motor in the RUE and RLE.  Skin: Skin is warm and dry. No erythema.  Laceration as described in HENT  Psychiatric: She has a normal mood and affect. Her behavior is normal.  Nursing note and vitals reviewed.   ED Course  Procedures (including critical care time) Labs Review Labs Reviewed  CBC - Abnormal; Notable for the following:    WBC 11.4 (*)     Hemoglobin 11.4 (*)    HCT 35.2 (*)    All other components within normal limits  BASIC METABOLIC PANEL - Abnormal; Notable for the following:    Glucose, Bld 143 (*)    Calcium 8.7 (*)    GFR calc non Af Amer 57 (*)    All other components within normal limits  TYPE AND SCREEN    Imaging Review Dg Tibia/fibula Left  11/30/2014   CLINICAL DATA:  Status post fall today with left knee pain  EXAM: LEFT TIBIA AND FIBULA - 2 VIEW  COMPARISON:  None.  FINDINGS: There is comminuted intra-articular displaced fracture of the proximal medial tibia. There is fracture of the distal fibula.  IMPRESSION: Comminuted displaced intra-articular fracture of the proximal medial tibia.  Fracture of distal fibula.   Electronically Signed   By: Abelardo Diesel M.D.   On: 11/30/2014 17:08   Dg Ankle Complete Left  11/30/2014   CLINICAL DATA:  Left ankle pain after tripping and falling on a side wall today.  EXAM: LEFT ANKLE COMPLETE - 3+ VIEW  COMPARISON:  None.  FINDINGS: Vertically oriented linear lucency in the proximal lateral malleolus, laterally. No tibia fracture and no dislocation. There is an ankle joint effusion. Also noted is mild diffuse soft tissue swelling.  IMPRESSION: Nondisplaced lateral malleolus fracture and small effusion.   Electronically Signed   By: Claudie Revering M.D.   On: 11/30/2014 17:07   Ct Head Wo Contrast  11/30/2014   CLINICAL DATA:  79 year old female with history of trauma from a fall over a sidewalk, with laceration overlying the left eye. No associated loss of consciousness. No neck pain.  EXAM: CT HEAD WITHOUT CONTRAST  CT CERVICAL SPINE WITHOUT CONTRAST  TECHNIQUE: Multidetector CT imaging of the head and cervical spine was performed following the standard protocol without intravenous contrast. Multiplanar CT image reconstructions of the cervical spine were also generated.  COMPARISON:  No priors.  FINDINGS: CT HEAD FINDINGS  Small amount of high attenuation material in the left  frontal scalp, compatible with reported scalp laceration/ hematoma. No acute displaced skull fractures are identified. No acute intracranial abnormality. Specifically, no evidence of acute post-traumatic intracranial hemorrhage, no definite regions of acute/subacute cerebral ischemia, no focal mass, mass effect, hydrocephalus or abnormal intra or extra-axial fluid collections. Tiny well-defined focus of low attenuation in the left insula, compatible with an old lacunar infarct. Physiologic calcifications in the basal ganglia bilaterally. The visualized paranasal sinuses and mastoids are generally well pneumatized. 11 mm lesion in the posterior aspect of the right maxillary sinus is compatible with a mucosal retention cyst or polyp.  CT CERVICAL SPINE FINDINGS  No acute displaced fractures in the cervical spine. Exaggeration of normal cervical lordosis, particularly at the level of C3. Alignment is otherwise anatomic. Prevertebral soft tissues are normal. Multilevel degenerative disc disease, most severe at C5-C6 and C6-C7. Severe multilevel facet arthropathy. Visualized portions of the upper thorax are unremarkable.  IMPRESSION: 1. Left frontal scalp laceration/hematoma. No underlying displaced skull fracture and no findings of significant acute intracranial trauma. 2. No evidence of significant acute traumatic injury to the cervical spine. 3. Multilevel degenerative disc disease and cervical spondylosis, as above. 4. Old lacunar infarct in the left insula.   Electronically Signed   By: Vinnie Langton M.D.   On: 11/30/2014 18:30   Ct Cervical Spine Wo Contrast  11/30/2014   CLINICAL DATA:  79 year old female with history of trauma from a fall over a sidewalk, with laceration overlying the left eye. No associated loss of consciousness. No neck pain.  EXAM: CT HEAD WITHOUT CONTRAST  CT CERVICAL SPINE WITHOUT CONTRAST  TECHNIQUE: Multidetector CT imaging of the head and cervical spine was performed following the  standard protocol without intravenous contrast. Multiplanar CT image reconstructions of the cervical spine were also generated.  COMPARISON:  No priors.  FINDINGS: CT HEAD FINDINGS  Small amount of high attenuation material in the left frontal scalp, compatible with reported scalp laceration/ hematoma. No acute displaced skull fractures are identified. No acute intracranial abnormality. Specifically, no evidence of acute post-traumatic intracranial hemorrhage, no definite regions of acute/subacute cerebral ischemia, no focal mass, mass effect, hydrocephalus or abnormal intra or extra-axial fluid collections. Tiny well-defined focus of low attenuation in the left insula, compatible with an old lacunar infarct. Physiologic calcifications in the basal ganglia bilaterally. The visualized paranasal sinuses and mastoids are generally well pneumatized. 11 mm lesion in the posterior aspect of the right maxillary sinus is compatible with a mucosal retention cyst or polyp.  CT CERVICAL SPINE FINDINGS  No acute displaced fractures in the cervical spine. Exaggeration of normal cervical lordosis, particularly at the level of C3. Alignment is otherwise anatomic. Prevertebral soft tissues are normal. Multilevel degenerative disc disease, most severe at C5-C6 and C6-C7. Severe multilevel facet arthropathy. Visualized portions of the upper thorax are unremarkable.  IMPRESSION: 1. Left frontal scalp laceration/hematoma. No underlying displaced skull fracture and no findings of significant acute intracranial trauma. 2. No evidence of significant acute traumatic injury to the cervical spine. 3. Multilevel degenerative disc disease and cervical spondylosis, as above. 4. Old lacunar infarct in the left insula.   Electronically Signed   By: Vinnie Langton M.D.   On: 11/30/2014 18:30   Dg Knee Complete 4 Views Left  11/30/2014   CLINICAL DATA:  Tripped on sidewalk yesterday and fell. Left knee pain. Initial encounter.  EXAM: LEFT KNEE  - COMPLETE 4+ VIEW  COMPARISON:  None.  FINDINGS: A comminuted and depressed fracture of the lateral tibial plateau is seen, with extension inferiorly into the lateral tibial metaphysis. No other fractures are identified. Generalized osteopenia noted. Small knee joint effusion also demonstrated. Mild degenerative spurring is seen involving the medial and patellofemoral compartments.  IMPRESSION: Comminuted and depressed lateral tibial plateau fracture.  Osteopenia.  Small knee joint effusion.  Mild medial and patellofemoral compartment degenerative spurring.   Electronically Signed   By: Earle Gell M.D.   On: 11/30/2014 17:07   Dg Humerus Left  11/30/2014   CLINICAL DATA:  Tripped and fell onto a sidewalk today.  EXAM: LEFT HUMERUS - 2+ VIEW  COMPARISON:  None.  FINDINGS: There is a comminuted proximal left humeral fracture, comprised of an oblique transverse component across the surgical neck and vertical components across the greater tuberosity and also up to the articular surface where there is a mild step-off. There is no dislocation. There is no bone lesion or bony destruction to suggest a pathologic basis for the fracture. There is mild impaction and slight displacement about the fractures.  IMPRESSION: Comminuted proximal left humeral fracture.  No dislocation.   Electronically Signed   By: Andreas Newport M.D.   On: 11/30/2014 17:07   Dg Hip Unilat With Pelvis 2-3 Views Left  11/30/2014   CLINICAL DATA:  Status post fall today with left hip pain  EXAM: DG HIP (WITH OR WITHOUT PELVIS) 2-3V LEFT  COMPARISON:  None.  FINDINGS: There is no evidence of hip fracture or dislocation. Left hip replacement is identified. There are degenerative joint changes of right hip with marked narrowed joint space and osteophyte formation.  IMPRESSION: No acute fracture or dislocation.   Electronically Signed   By: Abelardo Diesel M.D.   On: 11/30/2014 17:06      MDM   Final diagnoses:  Fall  Head injury   Fracture, tibial plateau, left, closed, initial encounter  Humerus fracture, left, closed, initial encounter    Image to r/o fractures Primary repair of facial laceration TDAP UTD. Mechanical fall - no headache and no LOC, minor - no indication for head or neck imaging.  LACERATION REPAIR Performed by: Johnna Acosta Authorized by: Johnna Acosta Consent: Verbal consent obtained. Risks and benefits: risks, benefits and alternatives were discussed Consent given by: patient Patient identity confirmed: provided demographic data Prepped and Draped in normal sterile fashion Wound explored  Laceration Location: L forehead  Laceration Length: 2cm  No Foreign Bodies seen or palpated  Anesthesia: local infiltration  Local anesthetic: lidocaine 1% with epinephrine  Anesthetic total: 1 ml - supraorbital nerve block  Irrigation method: syringe Amount of cleaning: standard  Skin closure: 6-0 prolene  Number of sutures: 3  Technique: Simple interrupted  Patient tolerance: Patient tolerated the procedure well with no immediate complications.   Multiple fractures - ankle, knee and shoulder  I have personally viewed and interpreted the imaging and agree with radiologist interpretation.  Paged ortho.  D/w Dr. Alvan Dame - he recommends sending to trauma center at [facility] -   D/w Dr. Noland Fordyce in ED and Dr. Eilleen Kempf with Trauma service who has accepted pt.  Appreciate consulting physicians.  Pt in agreement  Lac repaired, pain controled, VS stable, labs unremarkable.  Noemi Chapel, MD 11/30/14 [phone]

## 2014-11-30 NOTE — ED Notes (Signed)
Brought in by [facility]. States she was at [employer] and tripped over side walk. Reports of left knee, left hip, left shoulder pain. Laceration noted over left eye. Denies neck/back pain. Denies LOC. C-colar in place on arrival via EMS.

## 2014-11-30 NOTE — ED Notes (Signed)
Transport here.

## 2014-12-08 DIAGNOSIS — I63511 Cerebral infarction due to unspecified occlusion or stenosis of right middle cerebral artery: Secondary | ICD-10-CM | POA: Diagnosis not present

## 2014-12-08 DIAGNOSIS — I10 Essential (primary) hypertension: Secondary | ICD-10-CM | POA: Diagnosis not present

## 2014-12-08 DIAGNOSIS — I69322 Dysarthria following cerebral infarction: Secondary | ICD-10-CM | POA: Diagnosis not present

## 2014-12-08 DIAGNOSIS — S42202D Unspecified fracture of upper end of left humerus, subsequent encounter for fracture with routine healing: Secondary | ICD-10-CM | POA: Diagnosis not present

## 2014-12-08 DIAGNOSIS — R531 Weakness: Secondary | ICD-10-CM | POA: Diagnosis not present

## 2014-12-08 DIAGNOSIS — G451 Carotid artery syndrome (hemispheric): Secondary | ICD-10-CM | POA: Diagnosis not present

## 2014-12-08 DIAGNOSIS — I517 Cardiomegaly: Secondary | ICD-10-CM | POA: Diagnosis not present

## 2014-12-08 DIAGNOSIS — S82142D Displaced bicondylar fracture of left tibia, subsequent encounter for closed fracture with routine healing: Secondary | ICD-10-CM | POA: Diagnosis not present

## 2014-12-08 DIAGNOSIS — G4700 Insomnia, unspecified: Secondary | ICD-10-CM | POA: Diagnosis present

## 2014-12-08 DIAGNOSIS — R471 Dysarthria and anarthria: Secondary | ICD-10-CM | POA: Diagnosis present

## 2014-12-08 DIAGNOSIS — D509 Iron deficiency anemia, unspecified: Secondary | ICD-10-CM | POA: Diagnosis not present

## 2014-12-08 DIAGNOSIS — G459 Transient cerebral ischemic attack, unspecified: Secondary | ICD-10-CM | POA: Diagnosis not present

## 2014-12-08 DIAGNOSIS — I69392 Facial weakness following cerebral infarction: Secondary | ICD-10-CM | POA: Diagnosis not present

## 2014-12-08 DIAGNOSIS — G8190 Hemiplegia, unspecified affecting unspecified side: Secondary | ICD-10-CM | POA: Diagnosis present

## 2014-12-08 DIAGNOSIS — Z96642 Presence of left artificial hip joint: Secondary | ICD-10-CM | POA: Diagnosis present

## 2014-12-08 DIAGNOSIS — R5381 Other malaise: Secondary | ICD-10-CM | POA: Diagnosis not present

## 2014-12-08 DIAGNOSIS — Z7409 Other reduced mobility: Secondary | ICD-10-CM | POA: Diagnosis not present

## 2014-12-08 DIAGNOSIS — M1990 Unspecified osteoarthritis, unspecified site: Secondary | ICD-10-CM | POA: Diagnosis not present

## 2014-12-29 DIAGNOSIS — S82102D Unspecified fracture of upper end of left tibia, subsequent encounter for closed fracture with routine healing: Secondary | ICD-10-CM | POA: Diagnosis not present

## 2014-12-29 DIAGNOSIS — I639 Cerebral infarction, unspecified: Secondary | ICD-10-CM | POA: Diagnosis not present

## 2015-01-10 DIAGNOSIS — Z7982 Long term (current) use of aspirin: Secondary | ICD-10-CM | POA: Diagnosis not present

## 2015-01-10 DIAGNOSIS — I10 Essential (primary) hypertension: Secondary | ICD-10-CM | POA: Diagnosis not present

## 2015-01-10 DIAGNOSIS — Z79899 Other long term (current) drug therapy: Secondary | ICD-10-CM | POA: Diagnosis not present

## 2015-01-10 DIAGNOSIS — I638 Other cerebral infarction: Secondary | ICD-10-CM | POA: Diagnosis not present

## 2015-01-10 DIAGNOSIS — I63511 Cerebral infarction due to unspecified occlusion or stenosis of right middle cerebral artery: Secondary | ICD-10-CM | POA: Diagnosis not present

## 2015-01-10 DIAGNOSIS — Z7902 Long term (current) use of antithrombotics/antiplatelets: Secondary | ICD-10-CM | POA: Diagnosis not present

## 2015-01-10 DIAGNOSIS — E782 Mixed hyperlipidemia: Secondary | ICD-10-CM | POA: Diagnosis not present

## 2015-01-13 DIAGNOSIS — Z8673 Personal history of transient ischemic attack (TIA), and cerebral infarction without residual deficits: Secondary | ICD-10-CM | POA: Diagnosis not present

## 2015-01-13 DIAGNOSIS — I10 Essential (primary) hypertension: Secondary | ICD-10-CM | POA: Diagnosis not present

## 2015-01-16 DIAGNOSIS — Z8673 Personal history of transient ischemic attack (TIA), and cerebral infarction without residual deficits: Secondary | ICD-10-CM | POA: Diagnosis not present

## 2015-01-16 DIAGNOSIS — I10 Essential (primary) hypertension: Secondary | ICD-10-CM | POA: Diagnosis not present

## 2015-01-17 DIAGNOSIS — Z8673 Personal history of transient ischemic attack (TIA), and cerebral infarction without residual deficits: Secondary | ICD-10-CM | POA: Diagnosis not present

## 2015-01-17 DIAGNOSIS — I10 Essential (primary) hypertension: Secondary | ICD-10-CM | POA: Diagnosis not present

## 2015-01-18 DIAGNOSIS — I10 Essential (primary) hypertension: Secondary | ICD-10-CM | POA: Diagnosis not present

## 2015-01-18 DIAGNOSIS — Z8673 Personal history of transient ischemic attack (TIA), and cerebral infarction without residual deficits: Secondary | ICD-10-CM | POA: Diagnosis not present

## 2015-01-19 DIAGNOSIS — S42212D Unspecified displaced fracture of surgical neck of left humerus, subsequent encounter for fracture with routine healing: Secondary | ICD-10-CM | POA: Diagnosis not present

## 2015-01-19 DIAGNOSIS — S42202D Unspecified fracture of upper end of left humerus, subsequent encounter for fracture with routine healing: Secondary | ICD-10-CM | POA: Diagnosis not present

## 2015-01-19 DIAGNOSIS — I10 Essential (primary) hypertension: Secondary | ICD-10-CM | POA: Diagnosis not present

## 2015-01-19 DIAGNOSIS — S82142D Displaced bicondylar fracture of left tibia, subsequent encounter for closed fracture with routine healing: Secondary | ICD-10-CM | POA: Diagnosis not present

## 2015-01-19 DIAGNOSIS — Z7982 Long term (current) use of aspirin: Secondary | ICD-10-CM | POA: Diagnosis not present

## 2015-01-23 DIAGNOSIS — Z8673 Personal history of transient ischemic attack (TIA), and cerebral infarction without residual deficits: Secondary | ICD-10-CM | POA: Diagnosis not present

## 2015-01-23 DIAGNOSIS — I10 Essential (primary) hypertension: Secondary | ICD-10-CM | POA: Diagnosis not present

## 2015-01-24 DIAGNOSIS — Z8673 Personal history of transient ischemic attack (TIA), and cerebral infarction without residual deficits: Secondary | ICD-10-CM | POA: Diagnosis not present

## 2015-01-24 DIAGNOSIS — Z5189 Encounter for other specified aftercare: Secondary | ICD-10-CM | POA: Diagnosis not present

## 2015-01-24 DIAGNOSIS — T07 Unspecified multiple injuries: Secondary | ICD-10-CM | POA: Diagnosis not present

## 2015-01-24 DIAGNOSIS — M810 Age-related osteoporosis without current pathological fracture: Secondary | ICD-10-CM | POA: Diagnosis not present

## 2015-01-24 DIAGNOSIS — Z9181 History of falling: Secondary | ICD-10-CM | POA: Diagnosis not present

## 2015-01-24 DIAGNOSIS — D649 Anemia, unspecified: Secondary | ICD-10-CM | POA: Diagnosis not present

## 2015-01-24 DIAGNOSIS — M25512 Pain in left shoulder: Secondary | ICD-10-CM | POA: Diagnosis not present

## 2015-01-24 DIAGNOSIS — I639 Cerebral infarction, unspecified: Secondary | ICD-10-CM | POA: Diagnosis not present

## 2015-01-25 ENCOUNTER — Encounter (HOSPITAL_COMMUNITY): Payer: Self-pay | Admitting: Occupational Therapy

## 2015-01-25 ENCOUNTER — Ambulatory Visit (HOSPITAL_COMMUNITY): Payer: Medicare Other | Attending: General Practice | Admitting: Occupational Therapy

## 2015-01-25 ENCOUNTER — Ambulatory Visit (HOSPITAL_COMMUNITY): Payer: Medicare Other | Admitting: Physical Therapy

## 2015-01-25 DIAGNOSIS — M6281 Muscle weakness (generalized): Secondary | ICD-10-CM | POA: Diagnosis not present

## 2015-01-25 DIAGNOSIS — M25612 Stiffness of left shoulder, not elsewhere classified: Secondary | ICD-10-CM | POA: Diagnosis not present

## 2015-01-25 DIAGNOSIS — S42202P Unspecified fracture of upper end of left humerus, subsequent encounter for fracture with malunion: Secondary | ICD-10-CM

## 2015-01-25 DIAGNOSIS — R269 Unspecified abnormalities of gait and mobility: Secondary | ICD-10-CM | POA: Diagnosis not present

## 2015-01-25 DIAGNOSIS — S82142G Displaced bicondylar fracture of left tibia, subsequent encounter for closed fracture with delayed healing: Secondary | ICD-10-CM | POA: Diagnosis not present

## 2015-01-25 DIAGNOSIS — M25562 Pain in left knee: Secondary | ICD-10-CM | POA: Insufficient documentation

## 2015-01-25 DIAGNOSIS — R29898 Other symptoms and signs involving the musculoskeletal system: Secondary | ICD-10-CM

## 2015-01-25 DIAGNOSIS — I639 Cerebral infarction, unspecified: Secondary | ICD-10-CM

## 2015-01-25 DIAGNOSIS — M25512 Pain in left shoulder: Secondary | ICD-10-CM | POA: Insufficient documentation

## 2015-01-25 DIAGNOSIS — M6289 Other specified disorders of muscle: Secondary | ICD-10-CM

## 2015-01-25 DIAGNOSIS — X58XXXD Exposure to other specified factors, subsequent encounter: Secondary | ICD-10-CM | POA: Insufficient documentation

## 2015-01-25 DIAGNOSIS — M629 Disorder of muscle, unspecified: Secondary | ICD-10-CM | POA: Insufficient documentation

## 2015-01-25 NOTE — Patient Instructions (Signed)
SHOULDER: Flexion On Table   Place hands on table, elbows straight. Move hips away from body. Press hands down into table.  _10__ reps per set, _2__ sets per day  Abduction (Passive)   With arm out to side, resting on table, lower head toward arm, keeping trunk away from table.  Repeat __10__ times. Do __2__ sessions per day.  Copyright  VHI. All rights reserved.     Internal Rotation (Assistive)   Seated with elbow bent at right angle and held against side, slide arm on table surface in an inward arc. Repeat _10___ times. Do __2__ sessions per day. Activity: Use this motion to brush crumbs off the table.  Copyright  VHI. All rights reserved.    

## 2015-01-25 NOTE — Patient Instructions (Signed)
Heel Raise (Sitting)   Raise heels, keeping toes on floor. Repeat _10___ times per set. Do ___1_ sets per session. Do _4___ sessions per day.  http://orth.exer.us/44   Copyright  VHI. All rights reserved.  Toe Raise (Sitting)   Raise toes, keeping heels on floor. Repeat _10___ times per set. Do _1___ sets per session. Do ___4_ sessions per day.  http://orth.exer.us/46   Copyright  VHI. All rights reserved.  Knee Extension (Sitting)   Place _2__ pound weight on left ankle and straighten knee fully, lower slowly. Repeat _10___ times per set. Do ___1_ sets per session. Do __4__ sessions per day.  http://orth.exer.us/732   Copyright  VHI. All rights reserved.  Strengthening: Quadriceps Set   Tighten muscles on top of thighs by pushing knees down into surface. Hold __3__ seconds. Repeat _10___ times per set. Do _1___ sets per session. Do ____ sessions per day.  http://orth.exer.us/602   Copyright  VHI. All rights reserved.  Strengthening: Hip Abduction (Side-Lying)   Tighten muscles on front of left thigh, then lift leg _15___ inches from surface, keeping knee locked.  Repeat _10___ times per set. Do ___1_ sets per session. Do _2___ sessions per day.  http://orth.exer.us/622   Copyright  VHI. All rights reserved.  Hamstring Curl: Resisted (Sitting)  Lie on your right side.  Have tubing anchored on bed. Place tubing around left knee and bend your knee.   Repeat __10__ times per set. Do 1____ sets per session. Do _2___ sessions per day.   http://orth.exer.us/668   Copyright  VHI. All rights reserved.  Bridging   Slowly raise buttocks from floor, keeping stomach tight. Repeat __10-15_ times per set. Do _1___ sets per session. Do __2__ sessions per day.  http://orth.exer.us/1096   Copyright  VHI. All rights reserved.  Strengthening: Hip Extension (Prone)  You will be lying on your right side.  Place tubing around left ankle and pull leg back keeping your  knee straight.  Tighten muscles on front of left thigh, then lift leg _2-3___ inches from surface, keeping knee locked. Repeat __10-15__ times per set. Do _1___ sets per session. Do _2___ sessions per day.  http://orth.exer.us/620   Copyright  VHI. All rights reserved.

## 2015-01-25 NOTE — Therapy (Addendum)
[name], [place], [address] Phone: [phone]   Fax:  [phone]  Occupational Therapy Evaluation  Patient Details  Name: Carol Gutierrez MRN: [id number] Date of Birth: 05-13-1935 Referring Provider:  Val Eagle, MD  Encounter Date: 01/25/2015      OT End of Session - 01/25/15 1407    Visit Number 1   Number of Visits 12   Date for OT Re-Evaluation 03/26/15  mini-reassess 02/22/2015   Authorization Type 1-Medicare/Medicare A & B;  2- [employer]   Authorization Time Period Before 10th visit   Authorization - Visit Number 1   Authorization - Number of Visits 10   OT Start Time 9476   OT Stop Time 1346   OT Time Calculation (min) 39 min   Activity Tolerance Patient tolerated treatment well   Behavior During Therapy [facility] for tasks assessed/performed      Past Medical History  Diagnosis Date  . Hypertension   . Arthritis     hands and back    Past Surgical History  Procedure Laterality Date  . Joint replacement  2008    left hip  . Colonoscopy N/A 09/08/2012    Procedure: COLONOSCOPY;  Surgeon: Rogene Houston, MD;  [facility];  Service: Endoscopy;  Laterality: N/A;  730-moved to 830 Ann to notify pt  . Total hip arthroplasty Left Left Hip Replacement    There were no vitals filed for this visit.  Visit Diagnosis:  Proximal humerus fracture, left, with malunion, subsequent encounter  Pain in left shoulder  Tight fascia  Stiffness of shoulder joint, left  Muscle weakness of left arm      Subjective Assessment - 01/25/15 1354    Subjective  S: I was coming out of the store and tripped and fell down.    Pertinent History Pt is a 79 y/o female s/p Lt humerus fx and Lt tibial fx from a fall on 11/30/14. Pt was at the [facility] for 2 weeks after her fall, was instructed in pendulum exercises for her LUE. Pt was referred for occupational therapy evaluation and treatment by Dr.  Val Eagle.    Special Tests FOTO Score: 28/100 (72% impairment)   Patient Stated Goals To get back to normal and doing what I want to   Currently in Pain? No/denies           [facility] OT Assessment - 01/25/15 1301    Assessment   Diagnosis left proximal humerus fracture   Onset Date 11/30/14   Prior Therapy yes   Precautions   Precautions None   Precaution Comments progress as tolerated; do not lift anything heavier than a glass of water   Restrictions   Weight Bearing Restrictions Yes   LUE Weight Bearing Non weight bearing   Balance Screen   Has the patient fallen in the past 6 months Yes   How many times? 1   Has the patient had a decrease in activity level because of a fear of falling?  No   Is the patient reluctant to leave their home because of a fear of falling?  No   Home  Environment   Family/patient expects to be discharged to: Private residence   Living Arrangements Alone   Available Help at Discharge Family   Type of [place]   Prior Function   Level of Independence Independent with basic ADLs   Vocation Retired   Leisure gardening, volunteering at [employer]  ADL   ADL comments Pt is unable to use her LUE for ADL tasks, has difficulty with bathing, grooming, housework, reaching up into high cabinets, carrying objects.    Written Expression   Dominant Hand Right   Vision - History   Baseline Vision No visual deficits   ROM / Strength   AROM / PROM / Strength AROM;PROM;Strength   Palpation   Palpation comment Max fascial restrictions in left upper arm, deltoid, trapezius, and scapularis regions.    AROM   Overall AROM Comments Assessed in supine, ER/IR adducted   AROM Assessment Site Shoulder   Right/Left Shoulder Left   Left Shoulder Flexion 93 Degrees   Left Shoulder ABduction 60 Degrees   Left Shoulder Internal Rotation 90 Degrees   Left Shoulder External Rotation 18 Degrees   PROM   Overall PROM Comments Assessed in supine, ER/IR adducted   PROM  Assessment Site Shoulder   Right/Left Shoulder Left   Left Shoulder Flexion 96 Degrees   Left Shoulder ABduction 68 Degrees   Left Shoulder Internal Rotation 90 Degrees   Left Shoulder External Rotation 25 Degrees   Strength   Overall Strength Comments Unable to assess this date   Strength Assessment Site Shoulder   Right/Left Shoulder Left                         OT Education - 01/25/15 1406    Education provided Yes   Education Details table slides   Person(s) Educated Patient   Methods Explanation;Demonstration;Handout   Comprehension Verbalized understanding;Returned demonstration          OT Short Term Goals - 01/25/15 1414    OT SHORT TERM GOAL #1   Title Pt will be educated on and independent in HEP.    Time 3   Period Weeks   Status New   OT SHORT TERM GOAL #2   Title Pt will decrease pain to 3/10 in LUE.    Time 3   Period Weeks   Status New   OT SHORT TERM GOAL #3   Title Pt will decrease fascial restrictions from max to mod amount in LUE.    Time 3   Period Weeks   Status New   OT SHORT TERM GOAL #4   Title Pt will increase AROM to [facility] to increase ability to donn shirts without compensatory strategies.    Time 3   Period Weeks   Status New   OT SHORT TERM GOAL #5   Title Pt will increase strength to 3/5 to increase ability wash hair using BUE.    Time 3   Period Weeks   Status New           OT Long Term Goals - 01/25/15 1417    OT LONG TERM GOAL #1   Title Pt will return to prior level of functioning and independence in ADL and leisure tasks.    Time 6   Period Weeks   Status New   OT LONG TERM GOAL #2   Title Pt will decrease pain to 1/10 or less during daily tasks.    Time 6   Period Weeks   Status New   OT LONG TERM GOAL #3   Title Pt will decrease fascial restrictions from mod to min amounts or less in LUE.    Time 6   Period Weeks   Status New   OT LONG TERM GOAL #4   Title Pt will  increase AROM to WNL to  increase ability to reach into overhead cabinets.    Time 6   Period Weeks   Status New   OT LONG TERM GOAL #5   Title Pt will increase strength to 4/5 to increase ability to hold lightweight objects.    Time 6   Period Weeks   Status New               Plan - 02/21/15 1409    Clinical Impression Statement A: Pt is a 79 y/o female s/p fall on 11/30/14 resulting in Lt proximal humerus fracture and Lt tibial fracture. Pt presents with increased pain and fascial restrictions, decreased range of motion and strength, limiting her ability to perform daily tasks at her highest level of functioning. MD has ordered OT 2x/week for 6 weeks. Pt will also be seeing PT for treatment of her Lt tibial fracture. Provided pt with table slides for HEP.    Pt will benefit from skilled therapeutic intervention in order to improve on the following deficits (Retired) Pain;Decreased strength;Impaired UE functional use;Increased fascial restricitons;Decreased range of motion;Impaired flexibility   Rehab Potential Good   OT Frequency 2x / week   OT Duration 6 weeks   OT Treatment/Interventions Self-care/ADL training;Passive range of motion;Patient/family education;Cryotherapy;Electrical Stimulation;Therapeutic exercise;Moist Heat;Manual Therapy;Therapeutic activities   Plan P: Pt would benefit from skilled OT services to decrease pain and fascial restrictions, increase range of motion and strength, to improve her ability to complete daily and leisure tasks at prior level of functioning and independence. Treatment Plan: Myofascial release, manual stretching, PROM, AAROM, AROM, general LUE strengthening, proximal shoulder strengtheing, scapular stabilization and strengthening.    OT Home Exercise Plan table slides   Consulted and Agree with Plan of Care Patient          G-Codes - Feb 21, 2015 1638    Functional Assessment Tool Used FOTO Score: 28/100 (72% impairment)   Functional Limitation Carrying, moving and  handling objects   Carrying, Moving and Handling Objects Current Status (O7564) At least 60 percent but less than 80 percent impaired, limited or restricted   Carrying, Moving and Handling Objects Goal Status (P3295) At least 20 percent but less than 40 percent impaired, limited or restricted      Problem List There are no active problems to display for this patient.   [name], OTR/L  [phone]  2015/02/21, 4:41 PM  [place] [address] [place], [place], [address] Phone: [phone]   Fax:  [phone]

## 2015-01-25 NOTE — Addendum Note (Signed)
Addended by: [name] A on: 01/25/2015 04:43 PM   Modules accepted: Orders

## 2015-01-25 NOTE — Therapy (Signed)
[place], [place], [address] Phone: [phone]   Fax:  [phone]  Physical Therapy Evaluation  Patient Details  Name: Carol Gutierrez MRN: [id number] Date of Birth: [birth year] Referring Provider:  Val Eagle, MD  Encounter Date: 01/25/2015      PT End of Session - 01/25/15 1627    Visit Number 1   Number of Visits 12   Date for PT Re-Evaluation 03/10/15   Authorization Type medicare    PT Start Time 9407   PT Stop Time 1430   PT Time Calculation (min) 42 min   Activity Tolerance Patient tolerated treatment well   Behavior During Therapy [facility] for tasks assessed/performed      Past Medical History  Diagnosis Date  . Hypertension   . Arthritis     hands and back    Past Surgical History  Procedure Laterality Date  . Joint replacement  2008    left hip  . Colonoscopy N/A 09/08/2012    Procedure: COLONOSCOPY;  Surgeon: Rogene Houston, MD;  [facility];  Service: Endoscopy;  Laterality: N/A;  730-moved to 830 Ann to notify pt  . Total hip arthroplasty Left Left Hip Replacement    There were no vitals filed for this visit.  Visit Diagnosis:  Tibial plateau fracture, left, closed, with delayed healing, subsequent encounter  Weakness of left leg  Abnormal gait  CVA (cerebral vascular accident)  Pain in joint involving left lower leg      Subjective Assessment - 01/25/15 1355    Subjective Carol Gutierrez states that she was at [facility] on 11/30/2014 when she fell on her Lt side fracturing her leg and shoulder.  She went to IP rehab.  She states she had a stroke while she was  at in-patient rehab on 12/10/2014.Marland Kitchen  She  has had HH and now is being referred for out-patient therapy to maximize her functional ability.     Pertinent History CVA 12/10/2014; HTN , osteroporosis,             [facility] PT Assessment - 01/25/15 1346    Assessment   Medical Diagnosis Closed bicondylar fx of Lt  tibial plateau   Onset Date/Surgical Date 11/30/14   Prior Therapy IP rehab; [facility]   Precautions   Precautions None   Restrictions   Weight Bearing Restrictions Yes   LLE Weight Bearing Non weight bearing  ablt ot put FWB on 02/02/2015   Other Position/Activity Restrictions NWB Lt UE    Balance Screen   Has the patient fallen in the past 6 months Yes   How many times? 1   Has the patient had a decrease in activity level because of a fear of falling?  Yes   Is the patient reluctant to leave their home because of a fear of falling?  No   Prior Function   Level of Independence Independent   Vocation Retired   Leisure gardening, volunteering at [employer]   Overall Cognitive Status Within Functional Limits for tasks assessed   Observation/Other Assessments   Focus on Therapeutic Outcomes (FOTO)  28   ROM / Strength   AROM / PROM / Strength AROM;Strength   AROM   AROM Assessment Site Knee   Right/Left Knee Left   Left Knee Extension 12   Left Knee Flexion 115   Strength   Strength Assessment Site Hip;Knee;Ankle   Right/Left Shoulder Left   Right/Left Hip Right;Left  Left Hip Flexion 4+/5   Left Hip Extension 4-/5   Left Hip ABduction 3/5   Right/Left Knee Right;Left   Left Knee Flexion 3/5   Left Knee Extension 4/5   Right/Left Ankle Right;Left                   [facility] Adult PT Treatment/Exercise - 01/25/15 0001    Exercises   Exercises Knee/Hip   Knee/Hip Exercises: Seated   Long Arc Quad Left;5 reps   Other Seated Knee/Hip Exercises ankle dorsiflexion/plantarflexion x 10    Knee/Hip Exercises: Supine   Bridges Both;10 reps   Straight Leg Raises Left;10 reps   Knee/Hip Exercises: Sidelying   Hip ABduction Strengthening;Left;10 reps   Other Sidelying Knee/Hip Exercises knee flexion/hip extension with t-band green x 10                 PT Education - 01/25/15 1626    Education provided Yes   Education Details HEP for LE strengthening     Person(s) Educated Patient   Methods Explanation;Handout   Comprehension Verbalized understanding;Returned demonstration          PT Short Term Goals - 01/25/15 1635    PT SHORT TERM GOAL #1   Title I HEP   Time 2   Period --   PT SHORT TERM GOAL #2   Title Pt to be I in ambulation with rolling walker for 200 ft    Time 3   Period Weeks   PT SHORT TERM GOAL #3   Title Pt to begin advance HEP ( closted chai)   Time 3   Period Weeks           PT Long Term Goals - 01/25/15 1636    PT LONG TERM GOAL #1   Title Pt to be ambulating with a cane   Time 4   Period Weeks   Status New   PT LONG TERM GOAL #2   Title Pt strength to be at least 4+/5 to be able to go up and down steps in a reciprocal manner.    Time 6   Period Weeks   PT LONG TERM GOAL #3   Title Pt to be walking in her home without an assistive device    Time 6   Period Weeks   Status New   PT LONG TERM GOAL #4   Title Pt to work out in the yard for 45 minutes without increased pain    Time 8   Period Weeks   PT LONG TERM GOAL #5   Title Pt to be able to SLS for 10 seconds to reduce risk of falling    Time 8   Period Weeks               Plan - 01/25/15 1628    Clinical Impression Statement Carol Gutierrez is a 79 yo female who felll sustaining a tibial plateau fx as well as a humeral fx on her left side on 11/30/2014.  She was in IP rehab in August when she had a stroke affecting her left side.  She currently is being referred to skilled therapy to maximize her functional ability.  She is to stay NWB on her Lt leg until 02/01/2014 at which time she can begin progressive weignt bearing until she is able to tolerate full weight  on her Lt leg.  She was given a HEP for strengthening of her weakened mm and has no questions.  Due  to her weightbearing limitation we will defer physical theapy for two weeks until she is able to bear weight on her LE at which time we will begin strengthening, balance and gai training  to maximize Carol Gutierrez's functional ability.     Pt will benefit from skilled therapeutic intervention in order to improve on the following deficits Abnormal gait;Decreased activity tolerance;Decreased balance;Decreased strength;Difficulty walking;Pain;Impaired perceived functional ability;Decreased knowledge of use of DME   Rehab Potential Good   PT Frequency 2x / week   PT Duration 6 weeks   PT Treatment/Interventions ADLs/Self Care Home Management;DME Instruction;Gait training;Stair training;Functional mobility training;Therapeutic activities;Therapeutic exercise;Balance training;Neuromuscular re-education;Patient/family education;Manual techniques   PT Next Visit Plan begin gait training with walker; heel raises, mini squats weight shift, rockerboard, lunging activity and side step.    PT Home Exercise Plan given    Consulted and Agree with Plan of Care Patient          G-Codes - 02/05/2015 1643    Mobility: Walking and Moving Around Current Status [phone]) At least 60 percent but less than 80 percent impaired, limited or restricted   Mobility: Walking and Moving Around Goal Status (L9532) At least 20 percent but less than 40 percent impaired, limited or restricted       Problem List There are no active problems to display for this patient.  Rayetta Humphrey, PT CLT [phone] 05-Feb-2015, 4:43 PM  [place] [address] [place], [place], [address] Phone: [phone]   Fax:  [phone]

## 2015-01-26 ENCOUNTER — Encounter (HOSPITAL_COMMUNITY): Payer: Self-pay | Admitting: Occupational Therapy

## 2015-01-26 ENCOUNTER — Ambulatory Visit (HOSPITAL_COMMUNITY): Payer: Medicare Other | Admitting: Occupational Therapy

## 2015-01-26 DIAGNOSIS — M629 Disorder of muscle, unspecified: Secondary | ICD-10-CM | POA: Diagnosis not present

## 2015-01-26 DIAGNOSIS — M6281 Muscle weakness (generalized): Secondary | ICD-10-CM

## 2015-01-26 DIAGNOSIS — M25612 Stiffness of left shoulder, not elsewhere classified: Secondary | ICD-10-CM | POA: Diagnosis not present

## 2015-01-26 DIAGNOSIS — M6289 Other specified disorders of muscle: Secondary | ICD-10-CM

## 2015-01-26 DIAGNOSIS — S42202P Unspecified fracture of upper end of left humerus, subsequent encounter for fracture with malunion: Secondary | ICD-10-CM | POA: Diagnosis not present

## 2015-01-26 DIAGNOSIS — M25512 Pain in left shoulder: Secondary | ICD-10-CM | POA: Diagnosis not present

## 2015-01-26 DIAGNOSIS — S82142G Displaced bicondylar fracture of left tibia, subsequent encounter for closed fracture with delayed healing: Secondary | ICD-10-CM | POA: Diagnosis not present

## 2015-01-26 NOTE — Therapy (Signed)
[place], [place], [address] Phone: [phone]   Fax:  [phone]  Occupational Therapy Treatment  Patient Details  Name: Carol Gutierrez MRN: [id number] Date of Birth: 1935-12-26 Referring Provider:  Asencion Noble, MD  Encounter Date: 01/26/2015      OT End of Session - 01/26/15 1526    Visit Number 2   Number of Visits 12   Date for OT Re-Evaluation 03/26/15  mini-reassess 02/22/2015   Authorization Type 1-Medicare/Medicare A & B;  2- [employer]   Authorization Time Period Before 10th visit   Authorization - Visit Number 2   Authorization - Number of Visits 10   OT Start Time 1430   OT Stop Time 1518   OT Time Calculation (min) 48 min   Activity Tolerance Patient tolerated treatment well   Behavior During Therapy [facility] for tasks assessed/performed      Past Medical History  Diagnosis Date  . Hypertension   . Arthritis     hands and back    Past Surgical History  Procedure Laterality Date  . Joint replacement  2008    left hip  . Colonoscopy N/A 09/08/2012    Procedure: COLONOSCOPY;  Surgeon: Rogene Houston, MD;  [facility];  Service: Endoscopy;  Laterality: N/A;  730-moved to 830 Ann to notify pt  . Total hip arthroplasty Left Left Hip Replacement    There were no vitals filed for this visit.  Visit Diagnosis:  Pain in left shoulder  Tight fascia  Stiffness of shoulder joint, left  Muscle weakness of left arm      Subjective Assessment - 01/26/15 1431    Subjective  S: I tried those exercises this morning and did pretty well I thought.    Currently in Pain? Yes   Pain Score 1    Pain Location Shoulder   Pain Orientation Left   Pain Descriptors / Indicators Sore   Pain Type Acute pain            [facility] OT Assessment - 01/26/15 1524    Assessment   Diagnosis left proximal humerus fracture   Precautions   Precautions None                  OT  Treatments/Exercises (OP) - 01/26/15 1432    Exercises   Exercises Shoulder   Shoulder Exercises: Supine   Protraction PROM;10 reps   Horizontal ABduction PROM;10 reps   External Rotation PROM;10 reps   Internal Rotation PROM;10 reps   Flexion PROM;10 reps   ABduction PROM;10 reps   Shoulder Exercises: Seated   Elevation AROM;10 reps   Extension AROM;10 reps   Row AROM;10 reps   Shoulder Exercises: Therapy Ball   Flexion 10 reps   ABduction 10 reps   Shoulder Exercises: Isometric Strengthening   Flexion Supine;3X3"   Extension Supine;3X3"   External Rotation Supine;3X3"   Internal Rotation Supine;3X3"   ABduction Supine;3X3"   ADduction Supine;3X3"   Manual Therapy   Manual Therapy Myofascial release   Myofascial Release Myofascial release to left upper arm, trapezius, and scapularis regions to decrease pain and fascial restrictions and increase joint range of motion.                 OT Education - 01/26/15 1526    Education provided Yes   Education Details Seated AROM exercises   Person(s) Educated Patient   Methods Explanation;Demonstration;Handout   Comprehension Verbalized understanding;Returned demonstration  OT Short Term Goals - 01/26/15 1529    OT SHORT TERM GOAL #1   Title Pt will be educated on and independent in HEP.    Time 3   Period Weeks   Status On-going   OT SHORT TERM GOAL #2   Title Pt will decrease pain to 3/10 in LUE.    Time 3   Period Weeks   Status On-going   OT SHORT TERM GOAL #3   Title Pt will decrease fascial restrictions from max to mod amount in LUE.    Time 3   Period Weeks   Status On-going   OT SHORT TERM GOAL #4   Title Pt will increase AROM to [facility] to increase ability to donn shirts without compensatory strategies.    Time 3   Period Weeks   Status On-going   OT SHORT TERM GOAL #5   Title Pt will increase strength to 3/5 to increase ability wash hair using BUE.    Time 3   Period Weeks   Status  On-going           OT Long Term Goals - 01/26/15 1529    OT LONG TERM GOAL #1   Title Pt will return to prior level of functioning and independence in ADL and leisure tasks.    Time 6   Period Weeks   Status On-going   OT LONG TERM GOAL #2   Title Pt will decrease pain to 1/10 or less during daily tasks.    Time 6   Period Weeks   Status On-going   OT LONG TERM GOAL #3   Title Pt will decrease fascial restrictions from mod to min amounts or less in LUE.    Time 6   Period Weeks   Status On-going   OT LONG TERM GOAL #4   Title Pt will increase AROM to WNL to increase ability to reach into overhead cabinets.    Time 6   Period Weeks   Status On-going   OT LONG TERM GOAL #5   Title Pt will increase strength to 4/5 to increase ability to hold lightweight objects.    Time 6   Period Weeks   Status On-going               Plan - 01/26/15 1526    Clinical Impression Statement A: Initiated myofascial release, PROM, seated AROM, and isometric exercise this session. Pt able to achieve approximately 50% range of motion today, pt reports minimal increase in pain during exercises. Provided pt with seated scapular AROM exercise HEP.    Plan P: Increase isometrics to 3X5, provide evaluation and review. Cont working to increase PROM.           G-Codes - 01-29-15 1638    Functional Assessment Tool Used FOTO Score: 28/100 (72% impairment)   Functional Limitation Carrying, moving and handling objects   Carrying, Moving and Handling Objects Current Status (K5537) At least 60 percent but less than 80 percent impaired, limited or restricted   Carrying, Moving and Handling Objects Goal Status (S8270) At least 20 percent but less than 40 percent impaired, limited or restricted      Problem List There are no active problems to display for this patient.   [name], OTR/L  [phone]  01/26/2015, 3:29 PM  [place] [address] [place], [place], [address] Phone: [phone]   Fax:  [phone]

## 2015-01-26 NOTE — Patient Instructions (Signed)
1) Seated Row   Sit up straight with elbows by your sides. Pull back with shoulders/elbows, keeping forearms straight, as if pulling back on the reins of a horse. Squeeze shoulder blades together. Repeat _10__times, __2__sets/day    2) Shoulder Elevation    Sit up straight with arms by your sides. Slowly bring your shoulders up towards your ears. Repeat_10__times, __2__ sets/day    3) Shoulder Extension    Sit up straight with both arms by your side, draw your arms back behind your waist. Keep your elbows straight. Repeat __10__times, _2___sets/day.        

## 2015-01-30 ENCOUNTER — Ambulatory Visit (HOSPITAL_COMMUNITY): Payer: Medicare Other | Admitting: Occupational Therapy

## 2015-01-30 ENCOUNTER — Encounter (HOSPITAL_COMMUNITY): Payer: Self-pay | Admitting: Occupational Therapy

## 2015-01-30 DIAGNOSIS — S42202P Unspecified fracture of upper end of left humerus, subsequent encounter for fracture with malunion: Secondary | ICD-10-CM | POA: Diagnosis not present

## 2015-01-30 DIAGNOSIS — S82142G Displaced bicondylar fracture of left tibia, subsequent encounter for closed fracture with delayed healing: Secondary | ICD-10-CM | POA: Diagnosis not present

## 2015-01-30 DIAGNOSIS — M6289 Other specified disorders of muscle: Secondary | ICD-10-CM

## 2015-01-30 DIAGNOSIS — M6281 Muscle weakness (generalized): Secondary | ICD-10-CM

## 2015-01-30 DIAGNOSIS — M25612 Stiffness of left shoulder, not elsewhere classified: Secondary | ICD-10-CM

## 2015-01-30 DIAGNOSIS — M629 Disorder of muscle, unspecified: Secondary | ICD-10-CM

## 2015-01-30 DIAGNOSIS — M25512 Pain in left shoulder: Secondary | ICD-10-CM | POA: Diagnosis not present

## 2015-01-30 NOTE — Therapy (Signed)
[place], [place], [address] Phone: [phone]   Fax:  [phone]  Occupational Therapy Treatment  Patient Details  Name: Carol Gutierrez MRN: [id number] Date of Birth: [birth year] Referring Dreshaun Stene:  Val Eagle, MD  Encounter Date: 01/30/2015      OT End of Session - 01/30/15 1231    Visit Number 3   Number of Visits 12   Date for OT Re-Evaluation 03/26/15  mini-reassess 02/22/2015   Authorization Type 1-Medicare/Medicare A & B;  2- [employer]   Authorization Time Period Before 10th visit   Authorization - Visit Number 3   Authorization - Number of Visits 10   OT Start Time 1027   OT Stop Time 1225   OT Time Calculation (min) 38 min   Activity Tolerance Patient tolerated treatment well   Behavior During Therapy [facility] for tasks assessed/performed      Past Medical History  Diagnosis Date  . Hypertension   . Arthritis     hands and back    Past Surgical History  Procedure Laterality Date  . Joint replacement  2008    left hip  . Colonoscopy N/A 09/08/2012    Procedure: COLONOSCOPY;  Surgeon: Rogene Houston, MD;  [facility];  Service: Endoscopy;  Laterality: N/A;  730-moved to 830 Ann to notify pt  . Total hip arthroplasty Left Left Hip Replacement    There were no vitals filed for this visit.  Visit Diagnosis:  Pain in left shoulder  Tight fascia  Stiffness of shoulder joint, left  Muscle weakness of left arm      Subjective Assessment - 01/30/15 1150    Subjective  S: That heating pad you told me about really worked.    Currently in Pain? No/denies            [facility] OT Assessment - 01/30/15 1230    Assessment   Diagnosis left proximal humerus fracture   Precautions   Precautions None                  OT Treatments/Exercises (OP) - 01/30/15 1151    Exercises   Exercises Shoulder   Shoulder Exercises: Supine   Protraction PROM;10 reps   Horizontal ABduction PROM;10 reps   External Rotation PROM;10 reps   Internal Rotation PROM;10 reps   Flexion PROM;10 reps   ABduction PROM;10 reps   Shoulder Exercises: Seated   Elevation AROM;10 reps   Extension AROM;10 reps   Row AROM;10 reps   Shoulder Exercises: Therapy Ball   Flexion 10 reps   ABduction 10 reps   Manual Therapy   Manual Therapy Myofascial release   Myofascial Release Myofascial release to left upper arm, trapezius, and scapularis regions to decrease pain and fascial restrictions and increase joint range of motion.                   OT Short Term Goals - 01/26/15 1529    OT SHORT TERM GOAL #1   Title Pt will be educated on and independent in HEP.    Time 3   Period Weeks   Status On-going   OT SHORT TERM GOAL #2   Title Pt will decrease pain to 3/10 in LUE.    Time 3   Period Weeks   Status On-going   OT SHORT TERM GOAL #3   Title Pt will decrease fascial restrictions from max to mod amount in LUE.  Time 3   Period Weeks   Status On-going   OT SHORT TERM GOAL #4   Title Pt will increase AROM to [facility] to increase ability to donn shirts without compensatory strategies.    Time 3   Period Weeks   Status On-going   OT SHORT TERM GOAL #5   Title Pt will increase strength to 3/5 to increase ability wash hair using BUE.    Time 3   Period Weeks   Status On-going           OT Long Term Goals - 01/26/15 1529    OT LONG TERM GOAL #1   Title Pt will return to prior level of functioning and independence in ADL and leisure tasks.    Time 6   Period Weeks   Status On-going   OT LONG TERM GOAL #2   Title Pt will decrease pain to 1/10 or less during daily tasks.    Time 6   Period Weeks   Status On-going   OT LONG TERM GOAL #3   Title Pt will decrease fascial restrictions from mod to min amounts or less in LUE.    Time 6   Period Weeks   Status On-going   OT LONG TERM GOAL #4   Title Pt will increase AROM to WNL to increase ability to  reach into overhead cabinets.    Time 6   Period Weeks   Status On-going   OT LONG TERM GOAL #5   Title Pt will increase strength to 4/5 to increase ability to hold lightweight objects.    Time 6   Period Weeks   Status On-going               Plan - 01/30/15 1231    Clinical Impression Statement A: Continued PROM and AROM exercises this session. Pt demonstrates good increase in range during PROM exercises today. Pt reports HEP is going well. Unable to provide evaluation due to printer malfunction.    Plan P: Resume isometrics, 3X5; Add AAROM supine if able to tolerate. Provide evaluation & review        Problem List There are no active problems to display for this patient.   [name], OTR/L  [phone]  01/30/2015, 12:35 PM  [place] [address] [place], [place], [address] Phone: [phone]   Fax:  [phone]

## 2015-01-31 ENCOUNTER — Encounter (HOSPITAL_COMMUNITY): Payer: PRIVATE HEALTH INSURANCE | Admitting: Physical Therapy

## 2015-01-31 ENCOUNTER — Other Ambulatory Visit (HOSPITAL_COMMUNITY): Payer: Self-pay | Admitting: Nurse Practitioner

## 2015-01-31 DIAGNOSIS — M810 Age-related osteoporosis without current pathological fracture: Secondary | ICD-10-CM

## 2015-02-02 ENCOUNTER — Inpatient Hospital Stay (HOSPITAL_COMMUNITY): Admission: RE | Admit: 2015-02-02 | Payer: PRIVATE HEALTH INSURANCE | Source: Ambulatory Visit

## 2015-02-02 ENCOUNTER — Encounter (HOSPITAL_COMMUNITY): Payer: Self-pay | Admitting: Occupational Therapy

## 2015-02-02 ENCOUNTER — Other Ambulatory Visit (HOSPITAL_COMMUNITY): Payer: PRIVATE HEALTH INSURANCE

## 2015-02-02 ENCOUNTER — Ambulatory Visit (HOSPITAL_COMMUNITY): Payer: Medicare Other | Admitting: Occupational Therapy

## 2015-02-02 DIAGNOSIS — I639 Cerebral infarction, unspecified: Secondary | ICD-10-CM | POA: Diagnosis not present

## 2015-02-02 DIAGNOSIS — M629 Disorder of muscle, unspecified: Secondary | ICD-10-CM

## 2015-02-02 DIAGNOSIS — S82142G Displaced bicondylar fracture of left tibia, subsequent encounter for closed fracture with delayed healing: Secondary | ICD-10-CM | POA: Diagnosis not present

## 2015-02-02 DIAGNOSIS — M25612 Stiffness of left shoulder, not elsewhere classified: Secondary | ICD-10-CM

## 2015-02-02 DIAGNOSIS — M6281 Muscle weakness (generalized): Secondary | ICD-10-CM | POA: Diagnosis not present

## 2015-02-02 DIAGNOSIS — M6289 Other specified disorders of muscle: Secondary | ICD-10-CM

## 2015-02-02 DIAGNOSIS — M810 Age-related osteoporosis without current pathological fracture: Secondary | ICD-10-CM | POA: Diagnosis not present

## 2015-02-02 DIAGNOSIS — S42202P Unspecified fracture of upper end of left humerus, subsequent encounter for fracture with malunion: Secondary | ICD-10-CM | POA: Diagnosis not present

## 2015-02-02 DIAGNOSIS — M25512 Pain in left shoulder: Secondary | ICD-10-CM | POA: Diagnosis not present

## 2015-02-02 DIAGNOSIS — S82102D Unspecified fracture of upper end of left tibia, subsequent encounter for closed fracture with routine healing: Secondary | ICD-10-CM | POA: Diagnosis not present

## 2015-02-02 DIAGNOSIS — Z23 Encounter for immunization: Secondary | ICD-10-CM | POA: Diagnosis not present

## 2015-02-02 NOTE — Therapy (Addendum)
[place], [place], [address] Phone: [phone]   Fax:  [phone]  Occupational Therapy Treatment  Patient Details  Name: Carol Gutierrez MRN: [id number] Date of Birth: [birth year] Referring Provider:  Val Eagle, MD  Encounter Date: 02/02/2015      OT End of Session - 02/02/15 1530    Visit Number 4   Number of Visits 12   Date for OT Re-Evaluation 03/26/15  mini-reassess 02/22/2015   Authorization Type 1-Medicare/Medicare A & B;  2- [employer]   Authorization Time Period Before 10th visit   Authorization - Visit Number 4   Authorization - Number of Visits 10   OT Start Time 2637   OT Stop Time 1430   OT Time Calculation (min) 43 min   Activity Tolerance Patient tolerated treatment well   Behavior During Therapy [facility] for tasks assessed/performed      Past Medical History  Diagnosis Date  . Hypertension   . Arthritis     hands and back    Past Surgical History  Procedure Laterality Date  . Joint replacement  2008    left hip  . Colonoscopy N/A 09/08/2012    Procedure: COLONOSCOPY;  Surgeon: Rogene Houston, MD;  [facility];  Service: Endoscopy;  Laterality: N/A;  730-moved to 830 Ann to notify pt  . Total hip arthroplasty Left Left Hip Replacement    There were no vitals filed for this visit.  Visit Diagnosis:  Pain in left shoulder  Tight fascia  Stiffness of shoulder joint, left  Muscle weakness of left arm      Subjective Assessment - 02/02/15 1350    Subjective  S: I did my exercises this morning.    Currently in Pain? Yes   Pain Score 3    Pain Location Shoulder   Pain Orientation Left   Pain Descriptors / Indicators Sore   Pain Type Acute pain            [facility] OT Assessment - 02/02/15 1529    Assessment   Diagnosis left proximal humerus fracture   Precautions   Precautions None                  OT Treatments/Exercises (OP) - 02/02/15 1350     Exercises   Exercises Shoulder   Shoulder Exercises: Supine   Protraction PROM;5 reps;AAROM;10 reps   Horizontal ABduction PROM;5 reps;AAROM;10 reps   External Rotation PROM;5 reps;AAROM;10 reps   Internal Rotation PROM;5 reps;AAROM;10 reps   Flexion PROM;5 reps;AAROM;10 reps   ABduction PROM;5 reps;AAROM;10 reps   Shoulder Exercises: Seated   Elevation AROM;15 reps   Extension AROM;15 reps   Row AROM;15 reps   Shoulder Exercises: Isometric Strengthening   Flexion Supine;3X3"   Extension Supine;3X3"   External Rotation Supine;3X3"   Internal Rotation Supine;3X3"   ABduction Supine;3X3"   ADduction Supine;3X3"   Manual Therapy   Manual Therapy Myofascial release   Myofascial Release Myofascial release to left upper arm, trapezius, and scapularis regions to decrease pain and fascial restrictions and increase joint range of motion.                   OT Short Term Goals - 01/26/15 1529    OT SHORT TERM GOAL #1   Title Pt will be educated on and independent in HEP.    Time 3   Period Weeks   Status On-going   OT SHORT TERM GOAL #2  Title Pt will decrease pain to 3/10 in LUE.    Time 3   Period Weeks   Status On-going   OT SHORT TERM GOAL #3   Title Pt will decrease fascial restrictions from max to mod amount in LUE.    Time 3   Period Weeks   Status On-going   OT SHORT TERM GOAL #4   Title Pt will increase AROM to [facility] to increase ability to donn shirts without compensatory strategies.    Time 3   Period Weeks   Status On-going   OT SHORT TERM GOAL #5   Title Pt will increase strength to 3/5 to increase ability wash hair using BUE.    Time 3   Period Weeks   Status On-going           OT Long Term Goals - 01/26/15 1529    OT LONG TERM GOAL #1   Title Pt will return to prior level of functioning and independence in ADL and leisure tasks.    Time 6   Period Weeks   Status On-going   OT LONG TERM GOAL #2   Title Pt will decrease pain to 1/10 or  less during daily tasks.    Time 6   Period Weeks   Status On-going   OT LONG TERM GOAL #3   Title Pt will decrease fascial restrictions from mod to min amounts or less in LUE.    Time 6   Period Weeks   Status On-going   OT LONG TERM GOAL #4   Title Pt will increase AROM to WNL to increase ability to reach into overhead cabinets.    Time 6   Period Weeks   Status On-going   OT LONG TERM GOAL #5   Title Pt will increase strength to 4/5 to increase ability to hold lightweight objects.    Time 6   Period Weeks   Status On-going               Plan - 02/02/15 1530    Clinical Impression Statement A: Added AAROM in supine this session, 10 repetitions of each exercise, resumed isometrics. Pt demonstrates good form, min verbal cuing required. Pt reports she completed her HEP daily.    Plan P: Add AAROM seated, if able to tolerate. Add wall wash. Provide AAROM HEP if appropriate        Problem List There are no active problems to display for this patient.   Guadelupe Gutierrez, OTR/L  [phone]  02/02/2015, 3:39 PM  [place] [address] [place], [place], [address] Phone: [phone]   Fax:  [phone]

## 2015-02-07 ENCOUNTER — Ambulatory Visit (HOSPITAL_COMMUNITY)
Admission: RE | Admit: 2015-02-07 | Discharge: 2015-02-07 | Disposition: A | Discharge status: Home / Self Care | Payer: Medicare Other | Source: Ambulatory Visit | Attending: Nurse Practitioner | Admitting: Nurse Practitioner

## 2015-02-07 ENCOUNTER — Ambulatory Visit (HOSPITAL_COMMUNITY): Payer: Medicare Other | Attending: General Practice

## 2015-02-07 ENCOUNTER — Ambulatory Visit (HOSPITAL_COMMUNITY): Payer: Medicare Other

## 2015-02-07 DIAGNOSIS — M25512 Pain in left shoulder: Secondary | ICD-10-CM | POA: Insufficient documentation

## 2015-02-07 DIAGNOSIS — M25562 Pain in left knee: Secondary | ICD-10-CM

## 2015-02-07 DIAGNOSIS — R269 Unspecified abnormalities of gait and mobility: Secondary | ICD-10-CM | POA: Diagnosis not present

## 2015-02-07 DIAGNOSIS — M25612 Stiffness of left shoulder, not elsewhere classified: Secondary | ICD-10-CM | POA: Diagnosis not present

## 2015-02-07 DIAGNOSIS — M6281 Muscle weakness (generalized): Secondary | ICD-10-CM | POA: Diagnosis not present

## 2015-02-07 DIAGNOSIS — R29898 Other symptoms and signs involving the musculoskeletal system: Secondary | ICD-10-CM | POA: Diagnosis not present

## 2015-02-07 DIAGNOSIS — X58XXXD Exposure to other specified factors, subsequent encounter: Secondary | ICD-10-CM | POA: Insufficient documentation

## 2015-02-07 DIAGNOSIS — M8580 Other specified disorders of bone density and structure, unspecified site: Secondary | ICD-10-CM | POA: Insufficient documentation

## 2015-02-07 DIAGNOSIS — M629 Disorder of muscle, unspecified: Secondary | ICD-10-CM | POA: Insufficient documentation

## 2015-02-07 DIAGNOSIS — S42202P Unspecified fracture of upper end of left humerus, subsequent encounter for fracture with malunion: Secondary | ICD-10-CM | POA: Diagnosis not present

## 2015-02-07 DIAGNOSIS — S82142G Displaced bicondylar fracture of left tibia, subsequent encounter for closed fracture with delayed healing: Secondary | ICD-10-CM | POA: Diagnosis not present

## 2015-02-07 DIAGNOSIS — M810 Age-related osteoporosis without current pathological fracture: Secondary | ICD-10-CM | POA: Diagnosis not present

## 2015-02-07 DIAGNOSIS — M85852 Other specified disorders of bone density and structure, left thigh: Secondary | ICD-10-CM | POA: Diagnosis not present

## 2015-02-07 DIAGNOSIS — Z780 Asymptomatic menopausal state: Secondary | ICD-10-CM | POA: Diagnosis not present

## 2015-02-07 NOTE — Therapy (Signed)
[place], [place], [address] Phone: [phone]   Fax:  [phone]  Physical Therapy Treatment  Patient Details  Name: Carol Gutierrez MRN: [id number] Date of Birth: 11-Jul-1935 Referring Provider:  Asencion Noble, MD  Encounter Date: 02/07/2015      PT End of Session - 02/07/15 1438    Visit Number 2   Number of Visits 12   Date for PT Re-Evaluation 03/10/15   Authorization Type medicare    Authorization Time Period Authorized 01/25/2015-03/27/2015   PT Start Time 1348   PT Stop Time 1430   PT Time Calculation (min) 42 min   Equipment Utilized During Treatment Gait belt   Activity Tolerance Patient tolerated treatment well   Behavior During Therapy [facility] for tasks assessed/performed      Past Medical History  Diagnosis Date  . Hypertension   . Arthritis     hands and back    Past Surgical History  Procedure Laterality Date  . Joint replacement  2008    left hip  . Colonoscopy N/A 09/08/2012    Procedure: COLONOSCOPY;  Surgeon: Rogene Houston, MD;  [facility];  Service: Endoscopy;  Laterality: N/A;  730-moved to 830 Ann to notify pt  . Total hip arthroplasty Left Left Hip Replacement    There were no vitals filed for this visit.  Visit Diagnosis:  Tibial plateau fracture, left, closed, with delayed healing, subsequent encounter  Weakness of left leg  Abnormal gait  Proximal humerus fracture, left, with malunion, subsequent encounter  Pain in joint involving left lower leg      Subjective Assessment - 02/07/15 1339    Subjective Pt stated compliance with HEP, reports a little soreness Lt knee and shoulder.  Weight bearing began on 02/02/2015, has not began an weight bearing yet.  Reports she feels her knee is not fully extended   Pertinent History CVA 12/10/2014; HTN , osteroporosis,    Currently in Pain? No/denies            [facility] PT Assessment - 02/07/15 0001    Assessment   Medical Diagnosis Closed bicondylar fx of Lt tibial plateau   Onset Date/Surgical Date 11/30/14   Next MD Visit 04/04/2015   Prior Therapy IP rehab; [facility] PT Treatment/Exercise - 02/07/15 0001    Knee/Hip Exercises: Standing   Heel Raises 10 reps   Heel Raises Limitations Toe raises 10x   Forward Lunges 10 reps;Both   Forward Lunges Limitations 6 in   Functional Squat 10 reps   Functional Squat Limitations 3D hip excursion   Gait Training Gait training with RW with min cueing heel to toe pattern and equal stance phase with supervision 226 feet   Other Standing Knee Exercises weight shifting and rotation 2sets x10 reps   Other Standing Knee Exercises STS 5x   Knee/Hip Exercises: Supine   Quad Sets Left;10 reps   Short Arc Quad Sets 10 reps   Terminal Knee Extension 10 reps            PT Short Term Goals - 01/25/15 1635    PT SHORT TERM GOAL #1   Title I HEP   Time 2   Period --   PT SHORT TERM GOAL #2   Title Pt to be I in ambulation with rolling walker for 200 ft    Time 3   Period Weeks  PT SHORT TERM GOAL #3   Title Pt to begin advance HEP ( closted chai)   Time 3   Period Weeks           PT Long Term Goals - 01/25/15 1636    PT LONG TERM GOAL #1   Title Pt to be ambulating with a cane   Time 4   Period Weeks   Status New   PT LONG TERM GOAL #2   Title Pt strength to be at least 4+/5 to be able to go up and down steps in a reciprocal manner.    Time 6   Period Weeks   PT LONG TERM GOAL #3   Title Pt to be walking in her home without an assistive device    Time 6   Period Weeks   Status New   PT LONG TERM GOAL #4   Title Pt to work out in the yard for 45 minutes without increased pain    Time 8   Period Weeks   PT LONG TERM GOAL #5   Title Pt to be able to SLS for 10 seconds to reduce risk of falling    Time 8   Period Weeks               Plan - 02/07/15 1441    Clinical Impression Statement Reviewed goals,  compliance with HEP and copy of evaluation given to pt.  Began weight bearing activities including weight bearing activities and gait training with RW.  Pt able to demonstrate appropriate techniques with min cueing for heel to toe pattern and equal stance phase with supervision and no LOB episodes.  Pt encouraged to begin gait training with RW at home indoors with family present.  Pt given 3D hip excursion printout to add to HEP to improve weight distribution and gluteal strengthening.  Pt encouraged to apply ice with elevation when home for pain and increased edema following weight bearing activities.  No reports of pain through session.     PT Next Visit Plan Next session progress closed chain activites, f/u with gait training with RW at home, continue weight shifting activities and begin rockerboard and sidestepping next session        Problem List There are no active problems to display for this patient.  [address] LPTA; [place]  Aldona Lento 02/07/2015, 2:50 PM  [place] [address] [place], [place], [address] Phone: [phone]   Fax:  [phone]

## 2015-02-07 NOTE — Patient Instructions (Signed)
3D Hip Excursions          

## 2015-02-09 ENCOUNTER — Ambulatory Visit (HOSPITAL_COMMUNITY): Payer: Medicare Other | Admitting: Physical Therapy

## 2015-02-09 ENCOUNTER — Ambulatory Visit (HOSPITAL_COMMUNITY): Payer: Medicare Other

## 2015-02-09 ENCOUNTER — Encounter (HOSPITAL_COMMUNITY): Payer: Self-pay

## 2015-02-09 DIAGNOSIS — M629 Disorder of muscle, unspecified: Secondary | ICD-10-CM | POA: Diagnosis not present

## 2015-02-09 DIAGNOSIS — S82142G Displaced bicondylar fracture of left tibia, subsequent encounter for closed fracture with delayed healing: Secondary | ICD-10-CM | POA: Diagnosis not present

## 2015-02-09 DIAGNOSIS — M6289 Other specified disorders of muscle: Secondary | ICD-10-CM

## 2015-02-09 DIAGNOSIS — M25612 Stiffness of left shoulder, not elsewhere classified: Secondary | ICD-10-CM

## 2015-02-09 DIAGNOSIS — M25512 Pain in left shoulder: Secondary | ICD-10-CM

## 2015-02-09 DIAGNOSIS — R29898 Other symptoms and signs involving the musculoskeletal system: Secondary | ICD-10-CM

## 2015-02-09 DIAGNOSIS — R269 Unspecified abnormalities of gait and mobility: Secondary | ICD-10-CM | POA: Diagnosis not present

## 2015-02-09 DIAGNOSIS — M6281 Muscle weakness (generalized): Secondary | ICD-10-CM

## 2015-02-09 NOTE — Patient Instructions (Signed)
Perform each exercise ___10-12_____ reps. 2-3x days.   Protraction - STANDING  Start by holding a wand or cane at chest height.  Next, slowly push the wand outwards in front of your body so that your elbows become fully straightened. Then, return to the original position.     Shoulder FLEXION - STANDING - PALMS UP  In the standing position, hold a wand/cane with both arms, palms up on both sides. Raise up the wand/cane allowing your unaffected arm to perform most of the effort. Your affected arm should be partially relaxed.      Internal/External ROTATION - STANDING  In the standing position, hold a wand/cane with both hands keeping your elbows bent. Move your arms and wand/cane to one side.  Your affected arm should be partially relaxed while your unaffected arm performs most of the effort.       Shoulder ABDUCTION - STANDING  While holding a wand/cane palm face up on the injured side and palm face down on the uninjured side, slowly raise up your injured arm to the side.        Horizontal Abduction/Adduction      Straight arms holding cane at shoulder height, bring cane to right, center, left. Repeat starting to left.

## 2015-02-09 NOTE — Therapy (Signed)
[place], [place], [address] Phone: [phone]   Fax:  [phone]  Occupational Therapy Treatment  Patient Details  Name: Carol Gutierrez MRN: [id number] Date of Birth: 09/07/1935 Referring Provider:  Val Eagle, MD  Encounter Date: 02/09/2015      OT End of Session - 02/09/15 1243    Visit Number 5   Number of Visits 12   Date for OT Re-Evaluation 03/26/15  mini-reassess 02/22/2015   Authorization Type 1-Medicare/Medicare A & B;  2- [employer]   Authorization Time Period Before 10th visit   Authorization - Visit Number 5   Authorization - Number of Visits 10   OT Start Time 1140   OT Stop Time 1225   OT Time Calculation (min) 45 min   Activity Tolerance Patient tolerated treatment well   Behavior During Therapy [facility] for tasks assessed/performed      Past Medical History  Diagnosis Date  . Hypertension   . Arthritis     hands and back    Past Surgical History  Procedure Laterality Date  . Joint replacement  2008    left hip  . Colonoscopy N/A 09/08/2012    Procedure: COLONOSCOPY;  Surgeon: Rogene Houston, MD;  [facility];  Service: Endoscopy;  Laterality: N/A;  730-moved to 830 Ann to notify pt  . Total hip arthroplasty Left Left Hip Replacement    There were no vitals filed for this visit.  Visit Diagnosis:  Pain in left shoulder  Tight fascia  Stiffness of shoulder joint, left  Muscle weakness of left arm      Subjective Assessment - 02/09/15 1206    Subjective  S: I wasn't able to do my exercises this morning because i was too sore.    Currently in Pain? Yes   Pain Score 3    Pain Location Shoulder   Pain Orientation Left   Pain Descriptors / Indicators Sore   Pain Type Acute pain            [facility] OT Assessment - 02/09/15 1207    Assessment   Diagnosis left proximal humerus fracture   Precautions   Precautions None                  OT  Treatments/Exercises (OP) - 02/09/15 1207    Exercises   Exercises Shoulder   Shoulder Exercises: Supine   Protraction PROM;5 reps;AAROM;10 reps   Horizontal ABduction PROM;5 reps;AAROM;10 reps   External Rotation PROM;5 reps;AAROM;10 reps   Internal Rotation PROM;5 reps;AAROM;10 reps   Flexion PROM;5 reps;AAROM;10 reps   ABduction PROM;5 reps;AAROM;10 reps   Shoulder Exercises: Seated   Protraction AAROM;10 reps   Horizontal ABduction AAROM;10 reps   External Rotation AAROM;10 reps   Internal Rotation AAROM;10 reps   Flexion AAROM;10 reps   Abduction AAROM;10 reps   Manual Therapy   Manual Therapy Myofascial release   Myofascial Release Myofascial release to left upper arm, trapezius, and scapularis regions to decrease pain and fascial restrictions and increase joint range of motion.                 OT Education - 02/09/15 1243    Education provided Yes   Education Details AA/ROM exercises.   Person(s) Educated Patient   Methods Explanation;Demonstration;Handout   Comprehension Returned demonstration;Verbalized understanding          OT Short Term Goals - 01/26/15 1529    OT SHORT TERM GOAL #  1   Title Pt will be educated on and independent in [place].    Time 3   Period Weeks   Status On-going   OT SHORT TERM GOAL #2   Title Pt will decrease pain to 3/10 in LUE.    Time 3   Period Weeks   Status On-going   OT SHORT TERM GOAL #3   Title Pt will decrease fascial restrictions from max to mod amount in LUE.    Time 3   Period Weeks   Status On-going   OT SHORT TERM GOAL #4   Title Pt will increase AROM to [facility] to increase ability to donn shirts without compensatory strategies.    Time 3   Period Weeks   Status On-going   OT SHORT TERM GOAL #5   Title Pt will increase strength to 3/5 to increase ability wash hair using BUE.    Time 3   Period Weeks   Status On-going           OT Long Term Goals - 01/26/15 1529    OT LONG TERM GOAL #1   Title Pt will  return to prior level of functioning and independence in ADL and leisure tasks.    Time 6   Period Weeks   Status On-going   OT LONG TERM GOAL #2   Title Pt will decrease pain to 1/10 or less during daily tasks.    Time 6   Period Weeks   Status On-going   OT LONG TERM GOAL #3   Title Pt will decrease fascial restrictions from mod to min amounts or less in LUE.    Time 6   Period Weeks   Status On-going   OT LONG TERM GOAL #4   Title Pt will increase AROM to WNL to increase ability to reach into overhead cabinets.    Time 6   Period Weeks   Status On-going   OT LONG TERM GOAL #5   Title Pt will increase strength to 4/5 to increase ability to hold lightweight objects.    Time 6   Period Weeks   Status On-going               Plan - 02/09/15 1244    Clinical Impression Statement A: Added AA/ROM seated and provided AA/ROM exercises for HEP. Due to time constraint did not add wall wash. Pt had increased joint stiffness with shoulder flexion and abduction.   Plan P: Add wall wash and pulleys. Try muscle energy technique to increase ROM.        Problem List There are no active problems to display for this patient.   Ailene Ravel, OTR/L,CBIS  [phone]  02/09/2015, 12:50 PM  [place] [address] [place], [place], [address] Phone: [phone]   Fax:  [phone]

## 2015-02-09 NOTE — Therapy (Signed)
[place], [place], [address] Phone: [phone]   Fax:  [phone]  Physical Therapy Treatment  Patient Details  Name: Carol Gutierrez MRN: [id number] Date of Birth: 1936/03/31 Referring Provider:  Val Eagle, MD  Encounter Date: 02/09/2015      PT End of Session - 02/09/15 1626    Visit Number 3   Number of Visits 12   Date for PT Re-Evaluation 03/10/15   Authorization Type medicare    Authorization Time Period Authorized 01/25/2015-03/27/2015   PT Start Time 1300   PT Stop Time 1346   PT Time Calculation (min) 46 min   Equipment Utilized During Treatment Gait belt   Activity Tolerance Patient tolerated treatment well   Behavior During Therapy [facility] for tasks assessed/performed      Past Medical History  Diagnosis Date  . Hypertension   . Arthritis     hands and back    Past Surgical History  Procedure Laterality Date  . Joint replacement  2008    left hip  . Colonoscopy N/A 09/08/2012    Procedure: COLONOSCOPY;  Surgeon: Rogene Houston, MD;  [facility];  Service: Endoscopy;  Laterality: N/A;  730-moved to 830 Ann to notify pt  . Total hip arthroplasty Left Left Hip Replacement    There were no vitals filed for this visit.  Visit Diagnosis:  Weakness of left leg  Abnormal gait  Tibial plateau fracture, left, closed, with delayed healing, subsequent encounter      Subjective Assessment - 02/09/15 1304    Subjective Pt reports that she has some stiffness in her L knee today, but no major pain.    Currently in Pain? No/denies   Pain Score 0-No pain                [facility] Adult PT Treatment/Exercise - 02/09/15 1315    Ambulation/Gait   Ambulation/Gait Yes   Ambulation Distance (Feet) 226 Feet  x2   Knee/Hip Exercises: Standing   Heel Raises 15 reps   Heel Raises Limitations Toe raises x 15   Forward Lunges 10 reps;Both   Forward Lunges Limitations 6 in   Rocker Board 2 minutes   Rocker Board Limitations R/L   Gait Training Gait training with RW with min cueing heel to toe pattern and equal stance phase with supervision 226 feet x 2   Knee/Hip Exercises: Seated   Long Arc Quad 15 reps;Left   Long Arc Quad Weight 3 lbs.   Hamstring Curl 15 reps   Hamstring Limitations green tband   Sit to Sand 10 reps;without UE support  2" step under R foot   Knee/Hip Exercises: Supine   Bridges Limitations 15 reps   Straight Leg Raises 15 reps;Left                  PT Short Term Goals - 01/25/15 1635    PT SHORT TERM GOAL #1   Title I HEP   Time 2   Period --   PT SHORT TERM GOAL #2   Title Pt to be I in ambulation with rolling walker for 200 ft    Time 3   Period Weeks   PT SHORT TERM GOAL #3   Title Pt to begin advance HEP ( closted chai)   Time 3   Period Weeks           PT Long Term Goals - 01/25/15 1636  PT LONG TERM GOAL #1   Title Pt to be ambulating with a cane   Time 4   Period Weeks   Status New   PT LONG TERM GOAL #2   Title Pt strength to be at least 4+/5 to be able to go up and down steps in a reciprocal manner.    Time 6   Period Weeks   PT LONG TERM GOAL #3   Title Pt to be walking in her home without an assistive device    Time 6   Period Weeks   Status New   PT LONG TERM GOAL #4   Title Pt to work out in the yard for 45 minutes without increased pain    Time 8   Period Weeks   PT LONG TERM GOAL #5   Title Pt to be able to SLS for 10 seconds to reduce risk of falling    Time 8   Period Weeks               Plan - 02/09/15 1627    Clinical Impression Statement Continued focus on functional strengthening, weight bearing activities, and gait training today. Pt was able to complete all standing strengthening today with minimal reports of pain, and was able to ambulate 226 feet x 2 with seated rest break due to fatigue. Rockerboard was added to improve weight shifting, pt was able to complete  with verbal cueing and no c/o pain.    PT Next Visit Plan Continue with gait training, closed chain activities, add sidestepping next treatment.         Problem List There are no active problems to display for this patient.   Hilma Favors, PT, DPT [phone] 02/09/2015, 4:34 PM  [place] [address] [place], [place], [address] Phone: [phone]   Fax:  [phone]

## 2015-02-12 ENCOUNTER — Encounter (HOSPITAL_COMMUNITY): Payer: PRIVATE HEALTH INSURANCE | Admitting: Physical Therapy

## 2015-02-12 ENCOUNTER — Ambulatory Visit (HOSPITAL_COMMUNITY): Payer: Medicare Other | Admitting: Specialist

## 2015-02-12 DIAGNOSIS — M25612 Stiffness of left shoulder, not elsewhere classified: Secondary | ICD-10-CM | POA: Diagnosis not present

## 2015-02-12 DIAGNOSIS — R269 Unspecified abnormalities of gait and mobility: Secondary | ICD-10-CM | POA: Diagnosis not present

## 2015-02-12 DIAGNOSIS — M6281 Muscle weakness (generalized): Secondary | ICD-10-CM | POA: Diagnosis not present

## 2015-02-12 DIAGNOSIS — R29898 Other symptoms and signs involving the musculoskeletal system: Secondary | ICD-10-CM | POA: Diagnosis not present

## 2015-02-12 DIAGNOSIS — M25512 Pain in left shoulder: Secondary | ICD-10-CM

## 2015-02-12 DIAGNOSIS — M629 Disorder of muscle, unspecified: Secondary | ICD-10-CM | POA: Diagnosis not present

## 2015-02-12 DIAGNOSIS — S82142G Displaced bicondylar fracture of left tibia, subsequent encounter for closed fracture with delayed healing: Secondary | ICD-10-CM | POA: Diagnosis not present

## 2015-02-12 NOTE — Therapy (Signed)
[place], [place], [address] Phone: [phone]   Fax:  [phone]  Occupational Therapy Treatment  Patient Details  Name: Carol Gutierrez MRN: [id number] Date of Birth: August 18, 1935 Referring Provider:  Val Eagle, MD  Encounter Date: 02/12/2015      OT End of Session - 02/12/15 1314    Visit Number 6   Number of Visits 12   Date for OT Re-Evaluation 03/26/15  mini reassess on 02/22/15   Authorization Type 1-Medicare/Medicare A & B;  2- [employer]   Authorization Time Period Before 10th visit   Authorization - Visit Number 6   Authorization - Number of Visits 10   OT Start Time 1150   OT Stop Time 1235   OT Time Calculation (min) 45 min   Activity Tolerance Patient tolerated treatment well   Behavior During Therapy [facility] for tasks assessed/performed      Past Medical History  Diagnosis Date  . Hypertension   . Arthritis     hands and back    Past Surgical History  Procedure Laterality Date  . Joint replacement  2008    left hip  . Colonoscopy N/A 09/08/2012    Procedure: COLONOSCOPY;  Surgeon: Rogene Houston, MD;  [facility];  Service: Endoscopy;  Laterality: N/A;  730-moved to 830 Ann to notify pt  . Total hip arthroplasty Left Left Hip Replacement    There were no vitals filed for this visit.  Visit Diagnosis:  Pain in left shoulder  Stiffness of shoulder joint, left      Subjective Assessment - 02/12/15 1312    Subjective  S:  I have been using my cane at home to do these exercises.     Currently in Pain? Yes   Pain Score 2    Pain Location Shoulder   Pain Orientation Left   Pain Descriptors / Indicators Sore   Pain Type Acute pain            [facility] OT Assessment - 02/12/15 0001    Assessment   Diagnosis left proximal humerus fracture   Precautions   Precautions None   Restrictions   Weight Bearing Restrictions Yes  nothing heavier than a glass of water                   OT Treatments/Exercises (OP) - 02/12/15 0001    Exercises   Exercises Shoulder   Shoulder Exercises: Supine   Protraction PROM;5 reps;AAROM;12 reps   Horizontal ABduction PROM;5 reps;AAROM;12 reps   External Rotation PROM;5 reps;AAROM;12 reps   Internal Rotation PROM;5 reps;AAROM;12 reps   Flexion PROM;5 reps;AAROM;12 reps   ABduction PROM;5 reps;AAROM;12 reps   Other Supine Exercises serratus anterior punch with min facilitation for technique 10 times    Shoulder Exercises: Seated   Elevation AROM;15 reps   Extension AROM;15 reps   Row AROM;15 reps   Protraction AAROM;10 reps   Horizontal ABduction AAROM;10 reps   External Rotation AAROM;10 reps   Internal Rotation AAROM;10 reps   Flexion AAROM;10 reps   Abduction AAROM;10 reps   Shoulder Exercises: Pulleys   Flexion 1 minute   ABduction 1 minute   Shoulder Exercises: Therapy Ball   Flexion 10 reps   ABduction 10 reps                  OT Short Term Goals - 01/26/15 1529    OT SHORT TERM GOAL #1   Title  Pt will be educated on and independent in [place].    Time 3   Period Weeks   Status On-going   OT SHORT TERM GOAL #2   Title Pt will decrease pain to 3/10 in LUE.    Time 3   Period Weeks   Status On-going   OT SHORT TERM GOAL #3   Title Pt will decrease fascial restrictions from max to mod amount in LUE.    Time 3   Period Weeks   Status On-going   OT SHORT TERM GOAL #4   Title Pt will increase AROM to [facility] to increase ability to donn shirts without compensatory strategies.    Time 3   Period Weeks   Status On-going   OT SHORT TERM GOAL #5   Title Pt will increase strength to 3/5 to increase ability wash hair using BUE.    Time 3   Period Weeks   Status On-going           OT Long Term Goals - 01/26/15 1529    OT LONG TERM GOAL #1   Title Pt will return to prior level of functioning and independence in ADL and leisure tasks.    Time 6   Period Weeks   Status On-going    OT LONG TERM GOAL #2   Title Pt will decrease pain to 1/10 or less during daily tasks.    Time 6   Period Weeks   Status On-going   OT LONG TERM GOAL #3   Title Pt will decrease fascial restrictions from mod to min amounts or less in LUE.    Time 6   Period Weeks   Status On-going   OT LONG TERM GOAL #4   Title Pt will increase AROM to WNL to increase ability to reach into overhead cabinets.    Time 6   Period Weeks   Status On-going   OT LONG TERM GOAL #5   Title Pt will increase strength to 4/5 to increase ability to hold lightweight objects.    Time 6   Period Weeks   Status On-going               Plan - 02/12/15 1315    Clinical Impression Statement A:  followed up on AA/ROM HEP, patient is completing at home.  Added pulleys this date, patient required min vg and physical assistance for technique.  patietn recieves good stretch with therapy ball stretches.     Plan P:  add wall wash and attempt muscle energy technique to improve ROM.        Problem List There are no active problems to display for this patient.   Vangie Bicker, OTR/L [phone]  02/12/2015, 1:19 PM  [place] [address] [place], [place], [address] Phone: [phone]   Fax:  [phone]

## 2015-02-14 ENCOUNTER — Encounter (HOSPITAL_COMMUNITY): Payer: Self-pay | Admitting: Occupational Therapy

## 2015-02-14 ENCOUNTER — Ambulatory Visit (HOSPITAL_COMMUNITY): Payer: Medicare Other | Admitting: Physical Therapy

## 2015-02-14 ENCOUNTER — Ambulatory Visit (HOSPITAL_COMMUNITY): Payer: Medicare Other | Admitting: Occupational Therapy

## 2015-02-14 DIAGNOSIS — M629 Disorder of muscle, unspecified: Secondary | ICD-10-CM

## 2015-02-14 DIAGNOSIS — M25612 Stiffness of left shoulder, not elsewhere classified: Secondary | ICD-10-CM | POA: Diagnosis not present

## 2015-02-14 DIAGNOSIS — M6281 Muscle weakness (generalized): Secondary | ICD-10-CM

## 2015-02-14 DIAGNOSIS — M6289 Other specified disorders of muscle: Secondary | ICD-10-CM

## 2015-02-14 DIAGNOSIS — R269 Unspecified abnormalities of gait and mobility: Secondary | ICD-10-CM | POA: Diagnosis not present

## 2015-02-14 DIAGNOSIS — M25562 Pain in left knee: Secondary | ICD-10-CM

## 2015-02-14 DIAGNOSIS — S82142G Displaced bicondylar fracture of left tibia, subsequent encounter for closed fracture with delayed healing: Secondary | ICD-10-CM

## 2015-02-14 DIAGNOSIS — M25512 Pain in left shoulder: Secondary | ICD-10-CM

## 2015-02-14 DIAGNOSIS — S42202P Unspecified fracture of upper end of left humerus, subsequent encounter for fracture with malunion: Secondary | ICD-10-CM

## 2015-02-14 DIAGNOSIS — R29898 Other symptoms and signs involving the musculoskeletal system: Secondary | ICD-10-CM | POA: Diagnosis not present

## 2015-02-14 NOTE — Therapy (Signed)
[place], [place], [address] Phone: [phone]   Fax:  [phone]  Physical Therapy Treatment  Patient Details  Name: Carol Gutierrez MRN: [id number] Date of Birth: 09-Apr-1936 Referring Provider:  Val Eagle, MD  Encounter Date: 02/14/2015      PT End of Session - 02/14/15 1714    Visit Number 4   Number of Visits 12   Date for PT Re-Evaluation 03/10/15   Authorization Type medicare    Authorization Time Period Authorized 01/25/2015-03/27/2015   PT Start Time 1350   PT Stop Time 1432   PT Time Calculation (min) 42 min   Equipment Utilized During Treatment Gait belt   Activity Tolerance Patient tolerated treatment well   Behavior During Therapy [facility] for tasks assessed/performed      Past Medical History  Diagnosis Date  . Hypertension   . Arthritis     hands and back    Past Surgical History  Procedure Laterality Date  . Joint replacement  2008    left hip  . Colonoscopy N/A 09/08/2012    Procedure: COLONOSCOPY;  Surgeon: Rogene Houston, MD;  [facility];  Service: Endoscopy;  Laterality: N/A;  730-moved to 830 Ann to notify pt  . Total hip arthroplasty Left Left Hip Replacement    There were no vitals filed for this visit.  Visit Diagnosis:  Abnormal gait  Tibial plateau fracture, left, closed, with delayed healing, subsequent encounter  Tight fascia  Muscle weakness of left arm  Proximal humerus fracture, left, with malunion, subsequent encounter  Pain in joint involving left lower leg      Subjective Assessment - 02/14/15 1704    Subjective PT states she is getting much better.  STates her leg is sore but no real pain in it.  Pt has been using her RW indoors.   Currently in Pain? Yes   Pain Score 2                          [facility] Adult PT Treatment/Exercise - 02/14/15 1410    Knee/Hip Exercises: Standing   Heel Raises 15 reps   Heel Raises  Limitations Toe raises x 15   Forward Lunges 15 reps   Forward Lunges Limitations 6 in   Functional Squat 15 reps   Functional Squat Limitations 3D hip excursion   Rocker Board 2 minutes   Rocker Board Limitations R/L   Gait Training Gait training with SPC with min cueing step through pattern with supervision 226 feet x 2   Other Standing Knee Exercises side stepping 2RT no AD                  PT Short Term Goals - 02/14/15 1350    PT SHORT TERM GOAL #1   Title I HEP   Time 2   Period Weeks   Status Achieved   PT SHORT TERM GOAL #2   Title Pt to be I in ambulation with rolling walker for 200 ft    Time 3   Period Weeks   Status Achieved   PT SHORT TERM GOAL #3   Title Pt to begin advance HEP ( closted chai)   Time 3   Period Weeks   Status On-going           PT Long Term Goals - 02/14/15 1400    PT LONG TERM GOAL #1   Title  Pt to be ambulating with a cane   Time 4   Period Weeks   Status On-going   PT LONG TERM GOAL #2   Title Pt strength to be at least 4+/5 to be able to go up and down steps in a reciprocal manner.    Time 6   Period Weeks   Status On-going   PT LONG TERM GOAL #3   Title Pt to be walking in her home without an assistive device    Time 6   Period Weeks   Status On-going   PT LONG TERM GOAL #4   Title Pt to work out in the yard for 45 minutes without increased pain    Time 8   Period Weeks   Status On-going   PT LONG TERM GOAL #5   Title Pt to be able to SLS for 10 seconds to reduce risk of falling    Time 8   Period Weeks   Status On-going               Plan - 02/14/15 1714    Clinical Impression Statement Progressed to ambulation using SPC today.  Pt with good step through pattern achieved without tenderness or pain.  Added additional standing exericses and sidetepping without AD to POC to begin working on stability and increasing WB through [place].  Pt very pleased with progress so far wtih therapy.  Encouraged patient  to begin using her RW more.    PT Next Visit Plan Continue with gait training using SPC and progress closed chain activities.         Problem List There are no active problems to display for this patient.   Teena Irani, PTA/CLT [phone]  02/14/2015, 5:17 PM  [place] [address] [place], [place], [address] Phone: [phone]   Fax:  [phone]

## 2015-02-14 NOTE — Therapy (Signed)
[place], [place], [address] Phone: [phone]   Fax:  [phone]  Occupational Therapy Treatment  Patient Details  Name: Carol Gutierrez MRN: [id number] Date of Birth: 12/29/1935 Referring Provider:  Val Eagle, MD  Encounter Date: 02/14/2015      OT End of Session - 02/14/15 1609    Visit Number 7   Number of Visits 12   Date for OT Re-Evaluation 03/26/15  mini reassess on 02/22/15   Authorization Type 1-Medicare/Medicare A & B;  2- A[employer]   Authorization Time Period Before 10th visit   Authorization - Visit Number 7   Authorization - Number of Visits 10   OT Start Time 1300   OT Stop Time 1348   OT Time Calculation (min) 48 min   Activity Tolerance Patient tolerated treatment well   Behavior During Therapy [facility] for tasks assessed/performed      Past Medical History  Diagnosis Date  . Hypertension   . Arthritis     hands and back    Past Surgical History  Procedure Laterality Date  . Joint replacement  2008    left hip  . Colonoscopy N/A 09/08/2012    Procedure: COLONOSCOPY;  Surgeon: Rogene Houston, MD;  [facility];  Service: Endoscopy;  Laterality: N/A;  730-moved to 830 Ann to notify pt  . Total hip arthroplasty Left Left Hip Replacement    There were no vitals filed for this visit.  Visit Diagnosis:  Pain in left shoulder  Stiffness of shoulder joint, left  Tight fascia  Muscle weakness of left arm      Subjective Assessment - 02/14/15 1302    Subjective  S: It's a little sore but I've been doing my exercises.    Currently in Pain? Yes   Pain Score 4    Pain Location Shoulder   Pain Orientation Left   Pain Descriptors / Indicators Sore   Pain Type Acute pain            [facility] OT Assessment - 02/14/15 1300    Assessment   Diagnosis left proximal humerus fracture   Precautions   Precautions None                  OT  Treatments/Exercises (OP) - 02/14/15 1303    Exercises   Exercises Shoulder   Shoulder Exercises: Supine   Protraction PROM;5 reps;AAROM;12 reps   Horizontal ABduction PROM;5 reps;AAROM;12 reps   External Rotation PROM;5 reps;AAROM;12 reps   Internal Rotation PROM;5 reps;AAROM;12 reps   Flexion PROM;5 reps;AAROM;12 reps   ABduction PROM;5 reps;AAROM;12 reps   Other Supine Exercises serratus anterior punch with min facilitation for technique 10 times    Shoulder Exercises: Seated   Protraction AAROM;10 reps   Horizontal ABduction AAROM;10 reps   External Rotation AAROM;10 reps   Internal Rotation AAROM;10 reps   Flexion AAROM;10 reps   Abduction AAROM;10 reps   Shoulder Exercises: Pulleys   Flexion 1 minute   ABduction 1 minute   Manual Therapy   Manual Therapy Myofascial release;Muscle Energy Technique   Myofascial Release Myofascial release to left upper arm, trapezius, and scapularis regions to decrease pain and fascial restrictions and increase joint range of motion.    Muscle Energy Technique Muscle energy technique to left UE to decrease muscle spasm and increase ROM.                   OT Short Term  Goals - 01/26/15 1529    OT SHORT TERM GOAL #1   Title Pt will be educated on and independent in HEP.    Time 3   Period Weeks   Status On-going   OT SHORT TERM GOAL #2   Title Pt will decrease pain to 3/10 in LUE.    Time 3   Period Weeks   Status On-going   OT SHORT TERM GOAL #3   Title Pt will decrease fascial restrictions from max to mod amount in LUE.    Time 3   Period Weeks   Status On-going   OT SHORT TERM GOAL #4   Title Pt will increase AROM to [facility] to increase ability to donn shirts without compensatory strategies.    Time 3   Period Weeks   Status On-going   OT SHORT TERM GOAL #5   Title Pt will increase strength to 3/5 to increase ability wash hair using BUE.    Time 3   Period Weeks   Status On-going           OT Long Term Goals -  01/26/15 1529    OT LONG TERM GOAL #1   Title Pt will return to prior level of functioning and independence in ADL and leisure tasks.    Time 6   Period Weeks   Status On-going   OT LONG TERM GOAL #2   Title Pt will decrease pain to 1/10 or less during daily tasks.    Time 6   Period Weeks   Status On-going   OT LONG TERM GOAL #3   Title Pt will decrease fascial restrictions from mod to min amounts or less in LUE.    Time 6   Period Weeks   Status On-going   OT LONG TERM GOAL #4   Title Pt will increase AROM to W[facility] to increase ability to reach into overhead cabinets.    Time 6   Period Weeks   Status On-going   OT LONG TERM GOAL #5   Title Pt will increase strength to 4/5 to increase ability to hold lightweight objects.    Time 6   Period Weeks   Status On-going               Plan - 02/14/15 1609    Clinical Impression Statement A: Added muscle energy technique, pt with good response. Pt reports exercises at home are going well, reviewed proper form and technique with pt during session.    Plan P: Add wall wash, resume missed exercises. Continue working to increase P/ROM to [facility].         Problem List There are no active problems to display for this patient.   [name], OTR/L  [phone]  02/14/2015, 4:11 PM  [place] [address] [place], [place], [address] Phone: [phone]   Fax:  [phone]

## 2015-02-19 ENCOUNTER — Ambulatory Visit (HOSPITAL_COMMUNITY): Payer: Medicare Other | Admitting: Specialist

## 2015-02-19 ENCOUNTER — Ambulatory Visit (HOSPITAL_COMMUNITY): Payer: Medicare Other | Admitting: Physical Therapy

## 2015-02-19 DIAGNOSIS — M629 Disorder of muscle, unspecified: Secondary | ICD-10-CM

## 2015-02-19 DIAGNOSIS — M25512 Pain in left shoulder: Secondary | ICD-10-CM

## 2015-02-19 DIAGNOSIS — M25562 Pain in left knee: Secondary | ICD-10-CM

## 2015-02-19 DIAGNOSIS — M25612 Stiffness of left shoulder, not elsewhere classified: Secondary | ICD-10-CM | POA: Diagnosis not present

## 2015-02-19 DIAGNOSIS — S82142G Displaced bicondylar fracture of left tibia, subsequent encounter for closed fracture with delayed healing: Secondary | ICD-10-CM

## 2015-02-19 DIAGNOSIS — M6281 Muscle weakness (generalized): Secondary | ICD-10-CM

## 2015-02-19 DIAGNOSIS — R269 Unspecified abnormalities of gait and mobility: Secondary | ICD-10-CM

## 2015-02-19 DIAGNOSIS — R29898 Other symptoms and signs involving the musculoskeletal system: Secondary | ICD-10-CM | POA: Diagnosis not present

## 2015-02-19 DIAGNOSIS — S42202P Unspecified fracture of upper end of left humerus, subsequent encounter for fracture with malunion: Secondary | ICD-10-CM

## 2015-02-19 DIAGNOSIS — M6289 Other specified disorders of muscle: Secondary | ICD-10-CM

## 2015-02-19 NOTE — Therapy (Signed)
[place], [place], [address] Phone: [phone]   Fax:  [phone]  Occupational Therapy Treatment  Patient Details  Name: Carol Gutierrez MRN: [id number] Date of Birth: [birth year] Referring Provider: Driscilla Moats  Encounter Date: 02/19/2015      OT End of Session - 02/19/15 1538    Visit Number 8   Number of Visits 12   Date for OT Re-Evaluation 03/26/15  mini reassess on 02/22/15   Authorization Type 1-Medicare/Medicare A & B;  2- [employer]   Authorization Time Period Before 10th visit   Authorization - Visit Number 8   Authorization - Number of Visits 10   OT Start Time 4008   OT Stop Time 1345   OT Time Calculation (min) 40 min   Activity Tolerance Patient tolerated treatment well   Behavior During Therapy [facility] for tasks assessed/performed      Past Medical History  Diagnosis Date  . Hypertension   . Arthritis     hands and back    Past Surgical History  Procedure Laterality Date  . Joint replacement  2008    left hip  . Colonoscopy N/A 09/08/2012    Procedure: COLONOSCOPY;  Surgeon: Rogene Houston, MD;  [facility];  Service: Endoscopy;  Laterality: N/A;  730-moved to 830 Ann to notify pt  . Total hip arthroplasty Left Left Hip Replacement    There were no vitals filed for this visit.  Visit Diagnosis:  Muscle weakness of left arm  Pain in left shoulder  Stiffness of shoulder joint, left      Subjective Assessment - 02/19/15 1308    Subjective  S:  Im having a little soreness however Ive been drinking more water and it seems to really help   Currently in Pain? Yes   Pain Score 1    Pain Location Shoulder   Pain Orientation Left   Pain Descriptors / Indicators Sore   Pain Type Acute pain            [facility] OT Assessment - 02/19/15 1309    Assessment   Diagnosis left proximal humerus fracture   Referring Provider [place]   Precautions   Precautions None   Restrictions   Weight Bearing Restrictions Yes   LUE Weight Bearing Non weight bearing                  OT Treatments/Exercises (OP) - 02/19/15 1310    Exercises   Exercises Shoulder   Shoulder Exercises: Supine   Protraction PROM;5 reps;AAROM;15 reps   Horizontal ABduction PROM;5 reps;AAROM;15 reps   External Rotation PROM;5 reps;AAROM;15 reps   Internal Rotation PROM;5 reps;AAROM;15 reps   Flexion PROM;5 reps;AAROM;15 reps   ABduction PROM;5 reps;AAROM;15 reps   Other Supine Exercises serratus anterior punch 10 times    Shoulder Exercises: Seated   Protraction AAROM;12 reps   Horizontal ABduction AAROM;12 reps   External Rotation AAROM;12 reps   Internal Rotation AAROM;12 reps   Flexion AAROM;12 reps   Abduction AAROM;12 reps   Shoulder Exercises: Pulleys   Flexion 2 minutes   ABduction 2 minutes   Shoulder Exercises: ROM/Strengthening   Wall Wash 1 minute   Manual Therapy   Manual Therapy Myofascial release   Myofascial Release Myofascial release to left upper arm, trapezius, and scapularis regions to decrease pain and fascial restrictions and increase joint range of motion.  OT Short Term Goals - 01/26/15 1529    OT SHORT TERM GOAL #1   Title Pt will be educated on and independent in HEP.    Time 3   Period Weeks   Status On-going   OT SHORT TERM GOAL #2   Title Pt will decrease pain to 3/10 in LUE.    Time 3   Period Weeks   Status On-going   OT SHORT TERM GOAL #3   Title Pt will decrease fascial restrictions from max to mod amount in LUE.    Time 3   Period Weeks   Status On-going   OT SHORT TERM GOAL #4   Title Pt will increase AROM to [facility] to increase ability to donn shirts without compensatory strategies.    Time 3   Period Weeks   Status On-going   OT SHORT TERM GOAL #5   Title Pt will increase strength to 3/5 to increase ability wash hair using BUE.    Time 3   Period Weeks   Status On-going           OT  Long Term Goals - 01/26/15 1529    OT LONG TERM GOAL #1   Title Pt will return to prior level of functioning and independence in ADL and leisure tasks.    Time 6   Period Weeks   Status On-going   OT LONG TERM GOAL #2   Title Pt will decrease pain to 1/10 or less during daily tasks.    Time 6   Period Weeks   Status On-going   OT LONG TERM GOAL #3   Title Pt will decrease fascial restrictions from mod to min amounts or less in LUE.    Time 6   Period Weeks   Status On-going   OT LONG TERM GOAL #4   Title Pt will increase AROM to WNL to increase ability to reach into overhead cabinets.    Time 6   Period Weeks   Status On-going   OT LONG TERM GOAL #5   Title Pt will increase strength to 4/5 to increase ability to hold lightweight objects.    Time 6   Period Weeks   Status On-going               Plan - 02/19/15 1539    Clinical Impression Statement A:  Began wall wash this date, increased to 2 minutes with pullleys   Plan P:  Attempt ball circles, add proximal shoulder strengthening in supine for improved scapular stability needed for improved A/ROM and AA/ROM.        Problem List There are no active problems to display for this patient.   Vangie Bicker, OTR/L [phone]  02/19/2015, 3:41 PM  [place] [address] [place], [place], [address] Phone: [phone]   Fax:  [phone]  Name: Carol Gutierrez MRN: [id number] Date of Birth: 24-Oct-1935

## 2015-02-19 NOTE — Therapy (Signed)
[place], [place], [address] Phone: [phone]   Fax:  [phone]  Physical Therapy Treatment  Patient Details  Name: Carol Gutierrez MRN: [id number] Date of Birth: [birth year] No Data Recorded  Encounter Date: 02/19/2015      PT End of Session - 02/19/15 1429    Visit Number 5   Number of Visits 12   Date for PT Re-Evaluation 03/10/15   Authorization Type medicare    Authorization Time Period Authorized 01/25/2015-03/27/2015   Authorization - Visit Number 5   Authorization - Number of Visits 10   PT Start Time 1350   PT Stop Time 1434   PT Time Calculation (min) 44 min   Equipment Utilized During Treatment Gait belt   Activity Tolerance Patient tolerated treatment well   Behavior During Therapy [facility] for tasks assessed/performed      Past Medical History  Diagnosis Date  . Hypertension   . Arthritis     hands and back    Past Surgical History  Procedure Laterality Date  . Joint replacement  2008    left hip  . Colonoscopy N/A 09/08/2012    Procedure: COLONOSCOPY;  Surgeon: Rogene Houston, MD;  [facility];  Service: Endoscopy;  Laterality: N/A;  730-moved to 830 Ann to notify pt  . Total hip arthroplasty Left Left Hip Replacement    There were no vitals filed for this visit.  Visit Diagnosis:  Abnormal gait  Tibial plateau fracture, left, closed, with delayed healing, subsequent encounter  Tight fascia  Muscle weakness of left arm  Proximal humerus fracture, left, with malunion, subsequent encounter  Pain in joint involving left lower leg  Pain in left shoulder  Stiffness of shoulder joint, left      Subjective Assessment - 02/19/15 1453    Subjective Pt states her leg hurts if she does too much.  continues to use RW and complete HEP.  No pain currently.   Currently in Pain? No/denies                         [facility] Adult PT Treatment/Exercise -  02/19/15 1403    Knee/Hip Exercises: Standing   Heel Raises 15 reps   Heel Raises Limitations Toe raises x 15   Forward Lunges 15 reps;Both   Forward Lunges Limitations 6 in   Hip Abduction Both;10 reps   Hip Extension Both;10 reps   Lateral Step Up Both;10 reps;Hand Hold: 1;Step Height: 2";Step Height: 4"   Forward Step Up Both;10 reps;Hand Hold: 1;Step Height: 2";Step Height: 4"   Functional Squat 15 reps   Rocker Board 2 minutes   Rocker Board Limitations R/L   Gait Training Gait training with SPC with min cueing step through pattern with supervision 226 feet x 2   Other Standing Knee Exercises side stepping 2RT no AD                PT Education - 02/19/15 1449    Education provided Yes   Education Details given standing abduction and extension for HEP   Person(s) Educated Patient   Methods Explanation;Demonstration;Handout   Comprehension Returned demonstration;Verbalized understanding          PT Short Term Goals - 02/14/15 1350    PT SHORT TERM GOAL #1   Title I HEP   Time 2   Period Weeks   Status Achieved   PT SHORT TERM GOAL #2  Title Pt to be I in ambulation with rolling walker for 200 ft    Time 3   Period Weeks   Status Achieved   PT SHORT TERM GOAL #3   Title Pt to begin advance HEP ( closted chai)   Time 3   Period Weeks   Status On-going           PT Long Term Goals - 02/14/15 1400    PT LONG TERM GOAL #1   Title Pt to be ambulating with a cane   Time 4   Period Weeks   Status On-going   PT LONG TERM GOAL #2   Title Pt strength to be at least 4+/5 to be able to go up and down steps in a reciprocal manner.    Time 6   Period Weeks   Status On-going   PT LONG TERM GOAL #3   Title Pt to be walking in her home without an assistive device    Time 6   Period Weeks   Status On-going   PT LONG TERM GOAL #4   Title Pt to work out in the yard for 45 minutes without increased pain    Time 8   Period Weeks   Status On-going   PT LONG  TERM GOAL #5   Title Pt to be able to SLS for 10 seconds to reduce risk of falling    Time 8   Period Weeks   Status On-going               Plan - 02/19/15 1430    Clinical Impression Statement Progressed with addition of standing hip extension/abduction and began step ups.  pt unable to complete higher than 2" step with LT due to weakness and reported positive work out when completing exericses on opposite LE .  Pt given writeen instructeions to complete at home.  Able to complete all exericses with 2 seated breaks today.  Improved giat qulaity using SPC.    PT Next Visit Plan Continue with gait training using SPC and progress closed chain activities.         Problem List There are no active problems to display for this patient.   Teena Irani, PTA/CLT [phone]  02/19/2015, 2:54 PM  [place], [place], [address] Phone: [phone]   Fax:  [phone]  Name: Carol Gutierrez MRN: [id number] Date of Birth: [birth year]

## 2015-02-19 NOTE — Patient Instructions (Addendum)
Hip Abduction (Standing) Stld and with support. Squeeze pelvic floor and hold. Lift right leg out to side, keeping toe forward. Hold EXTENSION: Standing (Active) t

## 2015-02-21 ENCOUNTER — Encounter (HOSPITAL_COMMUNITY): Payer: Self-pay | Admitting: Occupational Therapy

## 2015-02-21 ENCOUNTER — Ambulatory Visit (HOSPITAL_COMMUNITY): Payer: Medicare Other

## 2015-02-21 ENCOUNTER — Ambulatory Visit (HOSPITAL_COMMUNITY): Payer: Medicare Other | Admitting: Occupational Therapy

## 2015-02-21 DIAGNOSIS — M25512 Pain in left shoulder: Secondary | ICD-10-CM

## 2015-02-21 DIAGNOSIS — M25612 Stiffness of left shoulder, not elsewhere classified: Secondary | ICD-10-CM

## 2015-02-21 DIAGNOSIS — M6289 Other specified disorders of muscle: Secondary | ICD-10-CM

## 2015-02-21 DIAGNOSIS — S82142G Displaced bicondylar fracture of left tibia, subsequent encounter for closed fracture with delayed healing: Secondary | ICD-10-CM

## 2015-02-21 DIAGNOSIS — R269 Unspecified abnormalities of gait and mobility: Secondary | ICD-10-CM | POA: Diagnosis not present

## 2015-02-21 DIAGNOSIS — M6281 Muscle weakness (generalized): Secondary | ICD-10-CM

## 2015-02-21 DIAGNOSIS — R29898 Other symptoms and signs involving the musculoskeletal system: Secondary | ICD-10-CM

## 2015-02-21 DIAGNOSIS — M629 Disorder of muscle, unspecified: Secondary | ICD-10-CM

## 2015-02-21 DIAGNOSIS — M25562 Pain in left knee: Secondary | ICD-10-CM

## 2015-02-21 DIAGNOSIS — S42202P Unspecified fracture of upper end of left humerus, subsequent encounter for fracture with malunion: Secondary | ICD-10-CM

## 2015-02-21 NOTE — Therapy (Signed)
[place], [place], [address] Phone: [phone]   Fax:  [phone]  Occupational Therapy Reassessment and Treatment  Patient Details  Name: Carol Gutierrez MRN: [id number] Date of Birth: Oct 12, 1935 Referring Provider: Driscilla Moats  Encounter Date: 02/21/2015      OT End of Session - 02/21/15 1438    Visit Number 9   Number of Visits 12   Date for OT Re-Evaluation 03/26/15   Authorization Type 1-Medicare/Medicare A & B;  2- A[employer]   Authorization Time Period Before 10th visit   Authorization - Visit Number 9   Authorization - Number of Visits 10   OT Start Time 1300   OT Stop Time 1346   OT Time Calculation (min) 46 min   Activity Tolerance Patient tolerated treatment well   Behavior During Therapy [facility] for tasks assessed/performed      Past Medical History  Diagnosis Date  . Hypertension   . Arthritis     hands and back    Past Surgical History  Procedure Laterality Date  . Joint replacement  2008    left hip  . Colonoscopy N/A 09/08/2012    Procedure: COLONOSCOPY;  Surgeon: Rogene Houston, MD;  [facility];  Service: Endoscopy;  Laterality: N/A;  730-moved to 830 Ann to notify pt  . Total hip arthroplasty Left Left Hip Replacement    There were no vitals filed for this visit.  Visit Diagnosis:  Tight fascia  Muscle weakness of left arm  Pain in left shoulder  Stiffness of shoulder joint, left      Subjective Assessment - 02/21/15 1257    Subjective  S: I've been doing my exercises and I think I can see some improvement.    Currently in Pain? Yes   Pain Score 1    Pain Location Shoulder   Pain Orientation Left   Pain Descriptors / Indicators Sore   Pain Type Acute pain           [facility] OT Assessment - 02/21/15 1257    Assessment   Diagnosis left proximal humerus fracture   Precautions   Precautions None   Precaution Comments progress as tolerated; do not lift  anything heavier than a glass of water   Restrictions   Weight Bearing Restrictions Yes   LUE Weight Bearing Non weight bearing   AROM   Overall AROM Comments Assessed in supine, ER/IR adducted   AROM Assessment Site Shoulder   Right/Left Shoulder Left   Left Shoulder Flexion 125 Degrees  previous 93   Left Shoulder ABduction 87 Degrees  previous 60   Left Shoulder Internal Rotation 90 Degrees  same as previous   Left Shoulder External Rotation 37 Degrees  previous 18   PROM   Left Shoulder Flexion 150 Degrees  previous 96   Left Shoulder ABduction 110 Degrees  previous 68   Left Shoulder Internal Rotation 90 Degrees  same as previous   Left Shoulder External Rotation 52 Degrees  previous 25                  OT Treatments/Exercises (OP) - 02/21/15 1324    Exercises   Exercises Shoulder   Shoulder Exercises: Supine   Protraction PROM;5 reps;AAROM;15 reps   Horizontal ABduction PROM;5 reps;AAROM;15 reps   External Rotation PROM;5 reps;AAROM;15 reps   Internal Rotation PROM;5 reps;AAROM;15 reps   Flexion PROM;5 reps;AAROM;15 reps   ABduction PROM;5 reps;AAROM;15 reps   Shoulder Exercises:  ROM/Strengthening   Proximal Shoulder Strengthening, Supine 10X each no rest breaks  mod difficulty   Manual Therapy   Manual Therapy Myofascial release;Muscle Energy Technique   Myofascial Release Myofascial release to left upper arm, trapezius, and scapularis regions to decrease pain and fascial restrictions and increase joint range of motion.    Muscle Energy Technique Muscle energy technique to left UE to decrease muscle spasm and increase ROM.                  OT Short Term Goals - 02/21/15 1332    OT SHORT TERM GOAL #1   Title Pt will be educated on and independent in HEP.    Time 3   Period Weeks   Status On-going   OT SHORT TERM GOAL #2   Title Pt will decrease pain to 3/10 in LUE.    Time 3   Period Weeks   Status On-going   OT SHORT TERM GOAL #3    Title Pt will decrease fascial restrictions from max to mod amount in LUE.    Time 3   Period Weeks   Status On-going   OT SHORT TERM GOAL #4   Title Pt will increase AROM to [facility] to increase ability to donn shirts without compensatory strategies.    Time 3   Period Weeks   Status On-going   OT SHORT TERM GOAL #5   Title Pt will increase strength to 3/5 to increase ability wash hair using BUE.    Time 3   Period Weeks   Status On-going           OT Long Term Goals - 01/26/15 1529    OT LONG TERM GOAL #1   Title Pt will return to prior level of functioning and independence in ADL and leisure tasks.    Time 6   Period Weeks   Status On-going   OT LONG TERM GOAL #2   Title Pt will decrease pain to 1/10 or less during daily tasks.    Time 6   Period Weeks   Status On-going   OT LONG TERM GOAL #3   Title Pt will decrease fascial restrictions from mod to min amounts or less in LUE.    Time 6   Period Weeks   Status On-going   OT LONG TERM GOAL #4   Title Pt will increase AROM to WNL to increase ability to reach into overhead cabinets.    Time 6   Period Weeks   Status On-going   OT LONG TERM GOAL #5   Title Pt will increase strength to 4/5 to increase ability to hold lightweight objects.    Time 6   Period Weeks   Status On-going               Plan - 02/21/15 1523    Clinical Impression Statement A: Mini reassessment completed this session. Pt is progressing towards goals and has made improvemenets in range of motion. Added proximal shoulder strengthening in supine. Did not complete ball circles due to time constraints.    Plan P: UPDATE G-CODE AND WRITE PROGRESS NOTE. Assess strength. Attempt ball circles.         Problem List There are no active problems to display for this patient.   [name], OTR/L  [phone]  02/21/2015, 3:27 PM  [place] [address] [place], [place], [address] Phone:  [phone]   Fax:  [phone]  Name:  LASHEBA STEVENS MRN: [id number] Date of Birth: [birth year]

## 2015-02-21 NOTE — Therapy (Signed)
[place], [place], [address] Phone: [phone]   Fax:  [phone]  Physical Therapy Treatment  Patient Details  Name: Carol Gutierrez MRN: [id number] Date of Birth: 1936/02/05 No Data Recorded  Encounter Date: 02/21/2015      PT End of Session - 02/21/15 1436    Visit Number 6   Number of Visits 12   Date for PT Re-Evaluation 03/10/15   Authorization Type medicare    Authorization Time Period Authorized 01/25/2015-03/27/2015   Authorization - Visit Number 6   Authorization - Number of Visits 10   PT Start Time 1350   PT Stop Time 1432   PT Time Calculation (min) 42 min   Equipment Utilized During Treatment Gait belt   Activity Tolerance Patient tolerated treatment well   Behavior During Therapy [facility] for tasks assessed/performed      Past Medical History  Diagnosis Date  . Hypertension   . Arthritis     hands and back    Past Surgical History  Procedure Laterality Date  . Joint replacement  2008    left hip  . Colonoscopy N/A 09/08/2012    Procedure: COLONOSCOPY;  Surgeon: Rogene Houston, MD;  [facility];  Service: Endoscopy;  Laterality: N/A;  730-moved to 830 Ann to notify pt  . Total hip arthroplasty Left Left Hip Replacement    There were no vitals filed for this visit.  Visit Diagnosis:  Abnormal gait  Tibial plateau fracture, left, closed, with delayed healing, subsequent encounter  Proximal humerus fracture, left, with malunion, subsequent encounter  Pain in joint involving left lower leg  Weakness of left leg      Subjective Assessment - 02/21/15 1350    Subjective Currently pain free today, most difficulty currently with weight bearing.   Currently in Pain? No/denies            [facility] Adult PT Treatment/Exercise - 02/21/15 1412    Exercises   Exercises Knee/Hip   Knee/Hip Exercises: Stretches   Gastroc Stretch 3 reps;30 seconds   Gastroc Stretch  Limitations slant board   Knee/Hip Exercises: Standing   Heel Raises 15 reps   Heel Raises Limitations Toe raises x 15   Forward Lunges 15 reps;Both   Forward Lunges Limitations 6 in   Lateral Step Up Left;15 reps;Hand Hold: 1;Step Height: 4"   Forward Step Up Left;10 reps;Hand Hold: 1;Step Height: 6"   Step Down Left;10 reps;Hand Hold: 1;Step Height: 4"   Functional Squat 15 reps   Stairs Reciprocal pattern 3RT ascend and descend 4in step height 1HR   Rocker Board 2 minutes   Rocker Board Limitations R/L   SLS Rt 16"; Lt 2x 30"   Gait Training Gait training with SPC with min cueing step through pattern with supervision 226 feet x 2   Other Standing Knee Exercises side stepping 2RT no AD   Manual Therapy   Manual Therapy Joint mobilization   Joint Mobilization tib/fib   Myofascial Release --   Muscle Energy Technique --            PT Short Term Goals - 02/14/15 1350    PT SHORT TERM GOAL #1   Title I HEP   Time 2   Period Weeks   Status Achieved   PT SHORT TERM GOAL #2   Title Pt to be I in ambulation with rolling walker for 200 ft    Time 3   Period  Weeks   Status Achieved   PT SHORT TERM GOAL #3   Title Pt to begin advance HEP ( closted chai)   Time 3   Period Weeks   Status On-going           PT Long Term Goals - 02/14/15 1400    PT LONG TERM GOAL #1   Title Pt to be ambulating with a cane   Time 4   Period Weeks   Status On-going   PT LONG TERM GOAL #2   Title Pt strength to be at least 4+/5 to be able to go up and down steps in a reciprocal manner.    Time 6   Period Weeks   Status On-going   PT LONG TERM GOAL #3   Title Pt to be walking in her home without an assistive device    Time 6   Period Weeks   Status On-going   PT LONG TERM GOAL #4   Title Pt to work out in the yard for 45 minutes without increased pain    Time 8   Period Weeks   Status On-going   PT LONG TERM GOAL #5   Title Pt to be able to SLS for 10 seconds to reduce risk of  falling    Time 8   Period Weeks   Status On-going               Plan - 02/21/15 1621    Clinical Impression Statement Pt improved gait mechanics and confidence with LRAD, pt able to complete gait training with SPC with CGA and no LOB episodes, minimal cueing required to equalize stance phase due to weakness.  Pt encouraged to begin ambulating with SPC inside home with family present.  Progressed  functional strengthening with gait training with minimal verbal cueing required for technique with reciprocal patter on 4in step.  No reoprts of increased pain at end of session.     PT Next Visit Plan Continue with gait training using SPC and progress closed chain activities.         Problem List There are no active problems to display for this patient.  [address] LPTA; [place]  Aldona Lento 02/21/2015, 4:35 PM  [place], [place], [address] Phone: [phone]   Fax:  [phone]  Name: Carol Gutierrez MRN: [id number] Date of Birth: January 18, 1936

## 2015-02-26 ENCOUNTER — Encounter (HOSPITAL_COMMUNITY): Payer: Self-pay

## 2015-02-26 ENCOUNTER — Ambulatory Visit (HOSPITAL_COMMUNITY): Payer: Medicare Other | Admitting: Physical Therapy

## 2015-02-26 ENCOUNTER — Ambulatory Visit (HOSPITAL_COMMUNITY): Payer: Medicare Other

## 2015-02-26 DIAGNOSIS — M629 Disorder of muscle, unspecified: Secondary | ICD-10-CM

## 2015-02-26 DIAGNOSIS — M25612 Stiffness of left shoulder, not elsewhere classified: Secondary | ICD-10-CM

## 2015-02-26 DIAGNOSIS — R269 Unspecified abnormalities of gait and mobility: Secondary | ICD-10-CM | POA: Diagnosis not present

## 2015-02-26 DIAGNOSIS — R29898 Other symptoms and signs involving the musculoskeletal system: Secondary | ICD-10-CM | POA: Diagnosis not present

## 2015-02-26 DIAGNOSIS — S82142G Displaced bicondylar fracture of left tibia, subsequent encounter for closed fracture with delayed healing: Secondary | ICD-10-CM | POA: Diagnosis not present

## 2015-02-26 DIAGNOSIS — M6289 Other specified disorders of muscle: Secondary | ICD-10-CM

## 2015-02-26 DIAGNOSIS — M6281 Muscle weakness (generalized): Secondary | ICD-10-CM | POA: Diagnosis not present

## 2015-02-26 NOTE — Therapy (Signed)
[place], [place], [address] Phone: [phone]   Fax:  [phone]  Occupational Therapy Treatment  Patient Details  Name: Carol Gutierrez MRN: [id number] Date of Birth: 06-05-1935 Referring Provider: Driscilla Moats  Encounter Date: 02/26/2015      OT End of Session - 02/26/15 1457    Visit Number 10   Number of Visits 12   Date for OT Re-Evaluation 03/26/15   Authorization Type 1-Medicare/Medicare A & B;  2- [employer]   Authorization Time Period Before 20th visit   Authorization - Visit Number 10   Authorization - Number of Visits 20   OT Start Time 1350   OT Stop Time 1430   OT Time Calculation (min) 40 min   Activity Tolerance Patient tolerated treatment well   Behavior During Therapy [facility] for tasks assessed/performed      Past Medical History  Diagnosis Date  . Hypertension   . Arthritis     hands and back    Past Surgical History  Procedure Laterality Date  . Joint replacement  2008    left hip  . Colonoscopy N/A 09/08/2012    Procedure: COLONOSCOPY;  Surgeon: Rogene Houston, MD;  [facility];  Service: Endoscopy;  Laterality: N/A;  730-moved to 830 Ann to notify pt  . Total hip arthroplasty Left Left Hip Replacement    There were no vitals filed for this visit.  Visit Diagnosis:  Tight fascia  Muscle weakness of left arm  Stiffness of shoulder joint, left      Subjective Assessment - 02/26/15 1414    Subjective  S: My shoulder is a little sore.    Special Tests FOTO score: 45/100   Currently in Pain? Yes   Pain Score 6    Pain Location Knee   Pain Orientation Left;Posterior   Pain Descriptors / Indicators Aching   Pain Type Acute pain            [facility] OT Assessment - 02/26/15 1415    Assessment   Diagnosis left proximal humerus fracture   Precautions   Precautions None   Precaution Comments progress as tolerated; do not lift anything heavier than a glass of  water   Strength   Overall Strength Comments Assessed seated. IR/er adducted   Strength Assessment Site Shoulder   Right/Left Shoulder Left   Left Shoulder Flexion 3-/5   Left Shoulder ABduction 3-/5   Left Shoulder Internal Rotation 3/5   Left Shoulder External Rotation 3/5                  OT Treatments/Exercises (OP) - 02/26/15 1415    Exercises   Exercises Shoulder   Shoulder Exercises: Supine   Protraction PROM;5 reps;AAROM;15 reps   Horizontal ABduction PROM;5 reps;AAROM;15 reps   External Rotation PROM;5 reps;AAROM;15 reps   Internal Rotation PROM;5 reps;AAROM;15 reps   Flexion PROM;5 reps;AAROM;15 reps   ABduction PROM;5 reps;AAROM;15 reps   Shoulder Exercises: Therapy Ball   Right/Left 5 reps   Manual Therapy   Manual Therapy Myofascial release   Myofascial Release Myofascial release to left upper arm, trapezius, and scapularis regions to decrease pain and fascial restrictions and increase joint range of motion.                   OT Short Term Goals - 02/21/15 1332    OT SHORT TERM GOAL #1   Title Pt will be educated on and independent in  HEP.    Time 3   Period Weeks   Status On-going   OT SHORT TERM GOAL #2   Title Pt will decrease pain to 3/10 in LUE.    Time 3   Period Weeks   Status On-going   OT SHORT TERM GOAL #3   Title Pt will decrease fascial restrictions from max to mod amount in LUE.    Time 3   Period Weeks   Status On-going   OT SHORT TERM GOAL #4   Title Pt will increase AROM to [facility] to increase ability to donn shirts without compensatory strategies.    Time 3   Period Weeks   Status On-going   OT SHORT TERM GOAL #5   Title Pt will increase strength to 3/5 to increase ability wash hair using BUE.    Time 3   Period Weeks   Status On-going           OT Long Term Goals - 01/26/15 1529    OT LONG TERM GOAL #1   Title Pt will return to prior level of functioning and independence in ADL and leisure tasks.    Time 6    Period Weeks   Status On-going   OT LONG TERM GOAL #2   Title Pt will decrease pain to 1/10 or less during daily tasks.    Time 6   Period Weeks   Status On-going   OT LONG TERM GOAL #3   Title Pt will decrease fascial restrictions from mod to min amounts or less in LUE.    Time 6   Period Weeks   Status On-going   OT LONG TERM GOAL #4   Title Pt will increase AROM to WNL to increase ability to reach into overhead cabinets.    Time 6   Period Weeks   Status On-going   OT LONG TERM GOAL #5   Title Pt will increase strength to 4/5 to increase ability to hold lightweight objects.    Time 6   Period Weeks   Status On-going               Plan - 03-04-2015 1458    Clinical Impression Statement A: Updated G Code and assessed strength. Ball circles completed with mod difficulty completing internal rotation. Min assist given from OT.   Plan P: Add scapular strengthening exercises with theraband.           G-Codes - 2015/03/04 1351    Functional Assessment Tool Used FOTO score: 45/100 (55% impaired)   Functional Limitation Carrying, moving and handling objects   Carrying, Moving and Handling Objects Current Status (W8032) At least 40 percent but less than 60 percent impaired, limited or restricted   Carrying, Moving and Handling Objects Goal Status (Z2248) At least 20 percent but less than 40 percent impaired, limited or restricted      Problem List There are no active problems to display for this patient.  Occupational Therapy Progress Note  Dates of Reporting Period: 01/2215 to 03/04/2015  Objective Reports of Subjective Statement: See above  Objective Measurements:  AROM    Overall AROM Comments Assessed in supine, ER/IR adducted   AROM Assessment Site Shoulder   Right/Left Shoulder Left   Left Shoulder Flexion 125 Degrees  previous 93   Left Shoulder ABduction 87 Degrees  previous 60   Left Shoulder Internal Rotation 90 Degrees  same as  previous   Left Shoulder External Rotation 37 Degrees  previous 18  PROM   Left Shoulder Flexion 150 Degrees  previous 96   Left Shoulder ABduction 110 Degrees  previous 68   Left Shoulder Internal Rotation 90 Degrees  same as previous   Left Shoulder External Rotation 52 Degrees  previous 25         Goal Update: Pt is progressing towards goals and has made improvements in range of motion.  Plan: See plan above  Reason Skilled Services are Required: Patient requires skilled OT services to increase functional performance during daily tasks using LUE.   Ailene Ravel, OTR/L,CBIS  [phone]  02/26/2015, 5:13 PM  [place] [address] [place], [place], [address] Phone: [phone]   Fax:  [phone]  Name: JARELY JUNCAJ MRN: [id number] Date of Birth: 1935/09/10

## 2015-02-26 NOTE — Therapy (Signed)
[place], [place], [address] Phone: [phone]   Fax:  [phone]  Physical Therapy Treatment  Patient Details  Name: Carol Gutierrez MRN: [id number] Date of Birth: March 27, 1936 No Data Recorded  Encounter Date: 02/26/2015      PT End of Session - 02/26/15 1545    Visit Number 7   Number of Visits 12   Date for PT Re-Evaluation 03/10/15   Authorization Type medicare    Authorization Time Period Authorized 01/25/2015-03/27/2015   Authorization - Visit Number 7   Authorization - Number of Visits 10   PT Start Time 1300   PT Stop Time 1345   PT Time Calculation (min) 45 min   Equipment Utilized During Treatment Gait belt   Activity Tolerance Patient limited by pain   Behavior During Therapy [facility] for tasks assessed/performed      Past Medical History  Diagnosis Date  . Hypertension   . Arthritis     hands and back    Past Surgical History  Procedure Laterality Date  . Joint replacement  2008    left hip  . Colonoscopy N/A 09/08/2012    Procedure: COLONOSCOPY;  Surgeon: Rogene Houston, MD;  [facility];  Service: Endoscopy;  Laterality: N/A;  730-moved to 830 Ann to notify pt  . Total hip arthroplasty Left Left Hip Replacement    There were no vitals filed for this visit.  Visit Diagnosis:  Tibial plateau fracture, left, closed, with delayed healing, subsequent encounter  Abnormal gait  Weakness of left leg      Subjective Assessment - 02/26/15 1311    Subjective Pt states that she is walking with her quad cane.    Currently in Pain? Yes   Pain Score 3    Pain Location Knee   Pain Orientation Left;Posterior   Pain Descriptors / Indicators Aching   Pain Type Acute pain                         [facility] Adult PT Treatment/Exercise - 02/26/15 0001    Exercises   Exercises Knee/Hip   Knee/Hip Exercises: Standing   Heel Raises Both;15 reps;Limitations   Heel  Raises Limitations no hands   Forward Lunges Left;10 reps   Side Lunges Both;10 reps   Functional Squat 15 reps   Functional Squat Limitations no UE   Rocker Board 2 minutes   Rocker Board Limitations Rt/Lt and ant/Post    SLS with Vectors 3 x 10" each    Other Standing Knee Exercises marching x 10    Knee/Hip Exercises: Seated   Sit to Sand 10 reps                PT Education - 02/26/15 1346    Education provided Yes   Education Details HEP for vector stance ; marching and lunges   Person(s) Educated Patient   Methods Explanation;Verbal cues;Handout;Tactile cues   Comprehension Verbalized understanding;Returned demonstration          PT Short Term Goals - 02/14/15 1350    PT SHORT TERM GOAL #1   Title I HEP   Time 2   Period Weeks   Status Achieved   PT SHORT TERM GOAL #2   Title Pt to be I in ambulation with rolling walker for 200 ft    Time 3   Period Weeks   Status Achieved   PT SHORT TERM GOAL #  3   Title Pt to begin advance HEP ( closted chai)   Time 3   Period Weeks   Status On-going           PT Long Term Goals - 02/14/15 1400    PT LONG TERM GOAL #1   Title Pt to be ambulating with a cane   Time 4   Period Weeks   Status On-going   PT LONG TERM GOAL #2   Title Pt strength to be at least 4+/5 to be able to go up and down steps in a reciprocal manner.    Time 6   Period Weeks   Status On-going   PT LONG TERM GOAL #3   Title Pt to be walking in her home without an assistive device    Time 6   Period Weeks   Status On-going   PT LONG TERM GOAL #4   Title Pt to work out in the yard for 45 minutes without increased pain    Time 8   Period Weeks   Status On-going   PT LONG TERM GOAL #5   Title Pt to be able to SLS for 10 seconds to reduce risk of falling    Time 8   Period Weeks   Status On-going               Plan - 02/26/15 1546    Clinical Impression Statement PT walking without cane in her home now.  Pt has an antalgic  gait secondary to still having significant increased pain with weight bearing on Lt LE>  Pt needs mininal verbal and manual cuing to complete exercises in proper form.    PT Next Visit Plan begin 4" steps    Consulted and Agree with Plan of Care Patient        Problem List There are no active problems to display for this patient.   Rayetta Humphrey, PT CLT [phone] 02/26/2015, 3:50 PM  [place], [place], [address] Phone: [phone]   Fax:  [phone]  Name: Carol Gutierrez MRN: [id number] Date of Birth: 28-Apr-1936

## 2015-02-26 NOTE — Patient Instructions (Signed)
Balance: Three-Way Leg Swing    Stand on left foot, hands on hips. Reach other foot forward _10___ seconds, sideways __10__ seconds, back _10___ seconds.  Repeat 3___ times per set. Do __1__ sets per session. Do ____ sessions per day.  http://orth.exer.us/86   Copyright  VHI. All rights reserved.  Marching in Place: Varied Surfaces    March in place, slowly lifting knees toward ceiling. Repeat __10__ times per session. Do ____ sessions per day. Repeat ____ times with eyes closed. Re2peat on compliant surface: ________.  Copyright  VHI. All rights reserved.  Forward Lunge    Standing with feet shoulder width apart and stomach tight, step forward with left leg. Repeat __10__ times per set. Do __1__ sets per session. Do __2__ sessions per day.  http://orth.exer.us/1146   Copyright  VHI. All rights reserved.  Side Lunge    Stand with knees slightly bent, stomach tight. Step to side with right leg. Repeat _10___ times per set. Do 1____ sets per session. Do _2___ sessions per day.  http://orth.exer.us/1148   Copyright  VHI. All rights reserved.

## 2015-02-28 ENCOUNTER — Encounter (HOSPITAL_COMMUNITY): Payer: Self-pay

## 2015-02-28 ENCOUNTER — Ambulatory Visit (HOSPITAL_COMMUNITY): Payer: Medicare Other | Admitting: Physical Therapy

## 2015-02-28 ENCOUNTER — Ambulatory Visit (HOSPITAL_COMMUNITY): Payer: Medicare Other

## 2015-02-28 DIAGNOSIS — S82142G Displaced bicondylar fracture of left tibia, subsequent encounter for closed fracture with delayed healing: Secondary | ICD-10-CM

## 2015-02-28 DIAGNOSIS — M25612 Stiffness of left shoulder, not elsewhere classified: Secondary | ICD-10-CM | POA: Diagnosis not present

## 2015-02-28 DIAGNOSIS — R269 Unspecified abnormalities of gait and mobility: Secondary | ICD-10-CM | POA: Diagnosis not present

## 2015-02-28 DIAGNOSIS — R29898 Other symptoms and signs involving the musculoskeletal system: Secondary | ICD-10-CM | POA: Diagnosis not present

## 2015-02-28 DIAGNOSIS — M629 Disorder of muscle, unspecified: Secondary | ICD-10-CM | POA: Diagnosis not present

## 2015-02-28 DIAGNOSIS — M6281 Muscle weakness (generalized): Secondary | ICD-10-CM

## 2015-02-28 NOTE — Therapy (Signed)
[place], [place], [address] Phone: [phone]   Fax:  [phone]  Occupational Therapy Treatment  Patient Details  Name: Carol Gutierrez MRN: [id number] Date of Birth: [birth year] Referring Provider: Driscilla Moats  Encounter Date: 02/28/2015      OT End of Session - 02/28/15 1433    Visit Number 11   Number of Visits 12   Date for OT Re-Evaluation 03/26/15   Authorization Type 1-Medicare/Medicare A & B;  2- [employer]   Authorization Time Period Before 20th visit   Authorization - Visit Number 11   Authorization - Number of Visits 20   OT Start Time 1310   OT Stop Time 1345   OT Time Calculation (min) 35 min   Activity Tolerance Patient tolerated treatment well   Behavior During Therapy [facility] for tasks assessed/performed      Past Medical History  Diagnosis Date  . Hypertension   . Arthritis     hands and back    Past Surgical History  Procedure Laterality Date  . Joint replacement  2008    left hip  . Colonoscopy N/A 09/08/2012    Procedure: COLONOSCOPY;  Surgeon: Rogene Houston, MD;  [facility];  Service: Endoscopy;  Laterality: N/A;  730-moved to 830 Ann to notify pt  . Total hip arthroplasty Left Left Hip Replacement    There were no vitals filed for this visit.  Visit Diagnosis:  Muscle weakness of left arm  Stiffness of shoulder joint, left      Subjective Assessment - 02/28/15 1322    Subjective  S: I try to do my exercises at home. I don't know if I'm over doing it.    Currently in Pain? No/denies            [facility] OT Assessment - 02/28/15 1324    Assessment   Diagnosis left proximal humerus fracture   Precautions   Precautions None   Precaution Comments progress as tolerated; do not lift anything heavier than a glass of water                  OT Treatments/Exercises (OP) - 02/28/15 1324    Exercises   Exercises Shoulder   Shoulder Exercises: Supine    Protraction PROM;5 reps;AROM;10 reps   Horizontal ABduction PROM;5 reps;AROM;10 reps   External Rotation PROM;5 reps;AROM;10 reps   Internal Rotation PROM;5 reps;AROM;10 reps   Flexion PROM;5 reps;AROM;10 reps   ABduction PROM;5 reps;AROM;12 reps   Shoulder Exercises: Seated   Protraction AROM;10 reps   Horizontal ABduction AROM;10 reps   External Rotation AROM;10 reps   Internal Rotation AROM;10 reps   Flexion AROM;10 reps   Abduction AROM;10 reps   Shoulder Exercises: Standing   Extension Theraband;10 reps   Theraband Level (Shoulder Extension) Level 2 (Red)   Row Theraband;10 reps   Theraband Level (Shoulder Row) Level 2 (Red)   Retraction Theraband;10 reps;Limitations   Theraband Level (Shoulder Retraction) Level 2 (Red)   Retraction Limitations VC for form and technique.    Shoulder Exercises: ROM/Strengthening   Over Head Lace 2'   Proximal Shoulder Strengthening, Supine 10X each no rest breaks   Functional Reaching Activities   Mid Level Functional reaching completed seated with resistive clothespins. Patient placed yellow clothespins on vertical bar placing them as high as possible. Mod-max difficulty.                   OT Short Term  Goals - 02/21/15 1332    OT SHORT TERM GOAL #1   Title Pt will be educated on and independent in HEP.    Time 3   Period Weeks   Status On-going   OT SHORT TERM GOAL #2   Title Pt will decrease pain to 3/10 in LUE.    Time 3   Period Weeks   Status On-going   OT SHORT TERM GOAL #3   Title Pt will decrease fascial restrictions from max to mod amount in LUE.    Time 3   Period Weeks   Status On-going   OT SHORT TERM GOAL #4   Title Pt will increase AROM to [facility] to increase ability to donn shirts without compensatory strategies.    Time 3   Period Weeks   Status On-going   OT SHORT TERM GOAL #5   Title Pt will increase strength to 3/5 to increase ability wash hair using BUE.    Time 3   Period Weeks   Status On-going            OT Long Term Goals - 01/26/15 1529    OT LONG TERM GOAL #1   Title Pt will return to prior level of functioning and independence in ADL and leisure tasks.    Time 6   Period Weeks   Status On-going   OT LONG TERM GOAL #2   Title Pt will decrease pain to 1/10 or less during daily tasks.    Time 6   Period Weeks   Status On-going   OT LONG TERM GOAL #3   Title Pt will decrease fascial restrictions from mod to min amounts or less in LUE.    Time 6   Period Weeks   Status On-going   OT LONG TERM GOAL #4   Title Pt will increase AROM to WNL to increase ability to reach into overhead cabinets.    Time 6   Period Weeks   Status On-going   OT LONG TERM GOAL #5   Title Pt will increase strength to 4/5 to increase ability to hold lightweight objects.    Time 6   Period Weeks   Status On-going               Plan - 02/28/15 1434    Clinical Impression Statement A: Added overhead lacing and A/ROM supine and seated. patient needed VC to depress shoulder while completing seated exercises.    Plan P: Provide physical assist to depress shoulder and progress patient to completing A/ROM exercise with less compensatory techniques.         Problem List There are no active problems to display for this patient.    Ailene Ravel, OTR/L,CBIS  [phone] 02/28/2015, 2:37 PM  [place] [address] [place], [place], [address] Phone: [phone]   Fax:  [phone]  Name: JASPREET BODNER MRN: [id number] Date of Birth: 26-Dec-1935

## 2015-02-28 NOTE — Therapy (Signed)
[place], [place], [address] Phone: [phone]   Fax:  [phone]  Physical Therapy Treatment  Patient Details  Name: Carol Gutierrez MRN: [id number] Date of Birth: [birth year] No Data Recorded  Encounter Date: 02/28/2015      PT End of Session - 02/28/15 1431    Visit Number 8   Number of Visits 12   Date for PT Re-Evaluation 03/10/15   Authorization Type medicare    Authorization Time Period Authorized 01/25/2015-03/27/2015   Authorization - Visit Number 8   Authorization - Number of Visits 10   PT Start Time 1350   PT Stop Time 1430   PT Time Calculation (min) 40 min   Equipment Utilized During Treatment Gait belt   Activity Tolerance Patient limited by pain;Patient tolerated treatment well   Behavior During Therapy [facility] for tasks assessed/performed      Past Medical History  Diagnosis Date  . Hypertension   . Arthritis     hands and back    Past Surgical History  Procedure Laterality Date  . Joint replacement  2008    left hip  . Colonoscopy N/A 09/08/2012    Procedure: COLONOSCOPY;  Surgeon: Rogene Houston, MD;  [facility];  Service: Endoscopy;  Laterality: N/A;  730-moved to 830 Ann to notify pt  . Total hip arthroplasty Left Left Hip Replacement    There were no vitals filed for this visit.  Visit Diagnosis:  Abnormal gait  Tibial plateau fracture, left, closed, with delayed healing, subsequent encounter  Weakness of left leg      Subjective Assessment - 02/28/15 1437    Subjective PT states she continues to work on walking with her QC at home . STates she is hurting a little in her distal Lt LE today.    Currently in Pain? Yes   Pain Score 4    Pain Location Knee   Pain Orientation Left;Posterior   Pain Descriptors / Indicators Aching                         [facility] Adult PT Treatment/Exercise - 02/28/15 1350    Knee/Hip Exercises: Stretches   Gastroc Stretch 3 reps;30 seconds   Gastroc Stretch Limitations slant board   Knee/Hip Exercises: Standing   Forward Lunges Left;10 reps   Forward Lunges Limitations 4" box   Side Lunges Both;10 reps   Side Lunges Limitations 4" box   Lateral Step Up Both;15 reps;Step Height: 4";Hand Hold: 1   Forward Step Up Both;15 reps;Step Height: 4";Hand Hold: 1   Step Down Both;15 reps;Step Height: 4";Hand Hold: 1   Functional Squat 15 reps   Functional Squat Limitations no UE   Rocker Board 2 minutes   Rocker Board Limitations Rt/Lt and ant/Post    Gait Training Gait training no AD 226 feet   Other Standing Knee Exercises sidestepping, retro and tandem 1RT each                  PT Short Term Goals - 02/14/15 1350    PT SHORT TERM GOAL #1   Title I HEP   Time 2   Period Weeks   Status Achieved   PT SHORT TERM GOAL #2   Title Pt to be I in ambulation with rolling walker for 200 ft    Time 3   Period Weeks   Status Achieved   PT SHORT TERM GOAL #  3   Title Pt to begin advance HEP ( closted chai)   Time 3   Period Weeks   Status On-going           PT Long Term Goals - 02/14/15 1400    PT LONG TERM GOAL #1   Title Pt to be ambulating with a cane   Time 4   Period Weeks   Status On-going   PT LONG TERM GOAL #2   Title Pt strength to be at least 4+/5 to be able to go up and down steps in a reciprocal manner.    Time 6   Period Weeks   Status On-going   PT LONG TERM GOAL #3   Title Pt to be walking in her home without an assistive device    Time 6   Period Weeks   Status On-going   PT LONG TERM GOAL #4   Title Pt to work out in the yard for 45 minutes without increased pain    Time 8   Period Weeks   Status On-going   PT LONG TERM GOAL #5   Title Pt to be able to SLS for 10 seconds to reduce risk of falling    Time 8   Period Weeks   Status On-going               Plan - 02/28/15 1431    Clinical Impression Statement Continued focus on increasing  functional strength and stability.  Reintroduced step ups with patient able to complete all with 1 HHA.  Decreased to 4" step with lunges without complaints, however required verbal and tactile cues to complete in proper form.  Ambulated without AD today with noted internal rotation of LE's, however patient reports she has always walked this way.  Progressed wtih balance actvities including retro and tandem gaitl.  Pt able to complete with min assist from therapist and cues to focus ahead rather than down at feet.  No increased pain reported at end of session.    PT Next Visit Plan Continue to work towards goals and improve functional strength to baseline.  continue to work on balance actvities and gait without AD.    Consulted and Agree with Plan of Care Patient        Problem List There are no active problems to display for this patient.   Teena Irani, PTA/CLT [phone] \\10 /26/2016, 2:37 PM  [place], [place], [place] Phone: [phone]   Fax:  [phone]  Name: Carol Gutierrez MRN: Deneise Lever Date of Birth: 21-Aug-1935

## 2015-03-05 ENCOUNTER — Encounter (HOSPITAL_COMMUNITY): Payer: Self-pay

## 2015-03-05 ENCOUNTER — Ambulatory Visit (HOSPITAL_COMMUNITY): Payer: Medicare Other

## 2015-03-05 ENCOUNTER — Ambulatory Visit (HOSPITAL_COMMUNITY): Payer: Medicare Other | Admitting: Physical Therapy

## 2015-03-05 DIAGNOSIS — M6281 Muscle weakness (generalized): Secondary | ICD-10-CM

## 2015-03-05 DIAGNOSIS — S82142G Displaced bicondylar fracture of left tibia, subsequent encounter for closed fracture with delayed healing: Secondary | ICD-10-CM | POA: Diagnosis not present

## 2015-03-05 DIAGNOSIS — M25612 Stiffness of left shoulder, not elsewhere classified: Secondary | ICD-10-CM

## 2015-03-05 DIAGNOSIS — M6289 Other specified disorders of muscle: Secondary | ICD-10-CM

## 2015-03-05 DIAGNOSIS — R269 Unspecified abnormalities of gait and mobility: Secondary | ICD-10-CM

## 2015-03-05 DIAGNOSIS — R29898 Other symptoms and signs involving the musculoskeletal system: Secondary | ICD-10-CM | POA: Diagnosis not present

## 2015-03-05 DIAGNOSIS — M629 Disorder of muscle, unspecified: Secondary | ICD-10-CM | POA: Diagnosis not present

## 2015-03-05 NOTE — Patient Instructions (Signed)
Strengthening: Hip Abduction (Side-Lying)    Tighten muscles on front of left thigh, then lift leg _15___ inches from surface, keeping knee locked.  Repeat _15___ times per set. Do __1__ sets per session. Do 2____ sessions per day.  http://orth.exer.us/622   Copyright  VHI. All rights reserved.  Strengthening: Hip Extension (Prone)    Tighten muscles on front of left thigh, then lift leg __2__ inches from surface, keeping knee locked. Repeat _15___ times per set. Do 1____ sets per session. Do __2__ sessions per day.  http://orth.exer.us/620   Copyright  VHI. All rights reserved.  Knee Flexion: Resisted (Sitting)   Do this on your stomach  Sit with band under left foot and looped around ankle of supported leg. Pull unsupported leg back. Repeat _10___ times per set. Do _1___ sets per session. Do __2__ sessions per day.  http://orth.exer.us/694   Copyright  VHI. All rights reserved.  Functional Quadriceps: Sit to Stand    Sit on edge of chair, feet flat on floor. Stand upright, extending knees fully. Repeat 5-10____ times per set. Do _1___ sets per session. Do __2__ sessions per day.  http://orth.exer.us/734   Copyright  VHI. All rights reserved.  Balance: Unilateral   At kitchen or bathroom counter. Attempt to balance on left leg, eyes open. Hold __5__ seconds. Repeat ___10_ times per set. Do 1____ sets per session. Do ___2_ sessions per day. Perform exercise with eyes closed.  http://orth.exer.us/28   Copyright  VHI. All rights reserved.

## 2015-03-05 NOTE — Therapy (Signed)
[place], [place], [address] Phone: [phone]   Fax:  [phone]  Occupational Therapy Treatment  Patient Details  Name: Carol Gutierrez MRN: [id number] Date of Birth: 03-15-1936 Referring Provider: Driscilla Moats  Encounter Date: 03/05/2015      OT End of Session - 03/05/15 1506    Visit Number 12   Number of Visits 20   Date for OT Re-Evaluation 03/26/15   Authorization Type 1-Medicare/Medicare A & B;  2- [employer]   Authorization Time Period Before 20th visit   Authorization - Visit Number 12   Authorization - Number of Visits 20   OT Start Time 1300   OT Stop Time 1345   OT Time Calculation (min) 45 min   Activity Tolerance Patient tolerated treatment well   Behavior During Therapy [facility] for tasks assessed/performed      Past Medical History  Diagnosis Date  . Hypertension   . Arthritis     hands and back    Past Surgical History  Procedure Laterality Date  . Joint replacement  2008    left hip  . Colonoscopy N/A 09/08/2012    Procedure: COLONOSCOPY;  Surgeon: Rogene Houston, MD;  [facility];  Service: Endoscopy;  Laterality: N/A;  730-moved to 830 Ann to notify pt  . Total hip arthroplasty Left Left Hip Replacement    There were no vitals filed for this visit.  Visit Diagnosis:  Muscle weakness of left arm  Stiffness of shoulder joint, left  Tight fascia      Subjective Assessment - 03/05/15 1321    Subjective  S: There are times I'm doing my exercises at home and I think my arm is moving really far but it's not.    Currently in Pain? No/denies            [facility] OT Assessment - 03/05/15 1321    Assessment   Diagnosis left proximal humerus fracture   Precautions   Precautions None   Precaution Comments progress as tolerated; do not lift anything heavier than a glass of water                  OT Treatments/Exercises (OP) - 03/05/15 1322    Exercises   Exercises Shoulder   Shoulder Exercises: Supine   Protraction PROM;5 reps;AROM;15 reps   Horizontal ABduction PROM;5 reps;AROM;15 reps   External Rotation PROM;5 reps;AROM;15 reps   Internal Rotation PROM;5 reps;AROM;15 reps   Flexion PROM;5 reps;AROM;15 reps   ABduction PROM;5 reps;AROM;15 reps   Shoulder Exercises: Seated   Protraction AROM;12 reps   Horizontal ABduction AROM;12 reps   External Rotation AROM;12 reps   Internal Rotation AROM;12 reps   Flexion AROM;12 reps   Abduction AROM;12 reps   Shoulder Exercises: ROM/Strengthening   Over Head Lace 2'  VC to remain seated up right.    Proximal Shoulder Strengthening, Supine 10X each no rest breaks   Manual Therapy   Manual Therapy Myofascial release   Myofascial Release Myofascial release to left upper arm, trapezius, and scapularis regions to decrease pain and fascial restrictions and increase joint range of motion.                   OT Short Term Goals - 02/21/15 1332    OT SHORT TERM GOAL #1   Title Pt will be educated on and independent in HEP.    Time 3   Period Weeks   Status On-going  OT SHORT TERM GOAL #2   Title Pt will decrease pain to 3/10 in LUE.    Time 3   Period Weeks   Status On-going   OT SHORT TERM GOAL #3   Title Pt will decrease fascial restrictions from max to mod amount in LUE.    Time 3   Period Weeks   Status On-going   OT SHORT TERM GOAL #4   Title Pt will increase AROM to [facility] to increase ability to donn shirts without compensatory strategies.    Time 3   Period Weeks   Status On-going   OT SHORT TERM GOAL #5   Title Pt will increase strength to 3/5 to increase ability wash hair using BUE.    Time 3   Period Weeks   Status On-going           OT Long Term Goals - 01/26/15 1529    OT LONG TERM GOAL #1   Title Pt will return to prior level of functioning and independence in ADL and leisure tasks.    Time 6   Period Weeks   Status On-going   OT LONG TERM GOAL #2    Title Pt will decrease pain to 1/10 or less during daily tasks.    Time 6   Period Weeks   Status On-going   OT LONG TERM GOAL #3   Title Pt will decrease fascial restrictions from mod to min amounts or less in LUE.    Time 6   Period Weeks   Status On-going   OT LONG TERM GOAL #4   Title Pt will increase AROM to WNL to increase ability to reach into overhead cabinets.    Time 6   Period Weeks   Status On-going   OT LONG TERM GOAL #5   Title Pt will increase strength to 4/5 to increase ability to hold lightweight objects.    Time 6   Period Weeks   Status On-going               Plan - 03/05/15 1506    Clinical Impression Statement A: Pt was provided VC to depress shoulder and focus on form when completing seated exercises. Pt did better when depressing shoulder although was unable to reach the same height of ROM as previously.    Plan P: Complete missed exercises. Add therapy ball left/right. Follow up on MD appt.        Problem List There are no active problems to display for this patient.   Ailene Ravel, OTR/L,CBIS  [phone]  03/05/2015, 3:14 PM  [place] [address] [place], [place], [address] Phone: [phone]   Fax:  [phone]  Name: DAWANNA GRAUBERGER MRN: [id number] Date of Birth: Jul 16, 1935

## 2015-03-05 NOTE — Therapy (Signed)
[place], [place], [address] Phone: [phone]   Fax:  [phone]  Physical Therapy Treatment  Patient Details  Name: Carol Gutierrez MRN: [id number] Date of Birth: 25-Jun-1935 No Data Recorded  Encounter Date: 03/05/2015      PT End of Session - 03/05/15 1621    Visit Number 9   Number of Visits 18   Date for PT Re-Evaluation 04/04/15   Authorization Type medicare    Authorization Time Period Authorized 01/25/2015-03/27/2015   Authorization - Visit Number 9   Authorization - Number of Visits 10   PT Start Time 1350   PT Stop Time 1430   PT Time Calculation (min) 40 min   Equipment Utilized During Treatment Gait belt   Activity Tolerance Patient tolerated treatment well   Behavior During Therapy [facility] for tasks assessed/performed      Past Medical History  Diagnosis Date  . Hypertension   . Arthritis     hands and back    Past Surgical History  Procedure Laterality Date  . Joint replacement  2008    left hip  . Colonoscopy N/A 09/08/2012    Procedure: COLONOSCOPY;  Surgeon: Rogene Houston, MD;  [facility];  Service: Endoscopy;  Laterality: N/A;  730-moved to 830 Ann to notify pt  . Total hip arthroplasty Left Left Hip Replacement    There were no vitals filed for this visit.  Visit Diagnosis:  Abnormal gait  Tibial plateau fracture, left, closed, with delayed healing, subsequent encounter  Weakness of left leg      Subjective Assessment - 03/05/15 1351    Subjective Pt states she is still having difficulty walking without an assisitve device.  She is still occasionally using her walker.    Pertinent History CVA 12/10/2014; HTN , osteroporosis,    How long can you sit comfortably? no problem;   How long can you stand comfortably? Able to stand for 5-10 minutes    How long can you walk comfortably? able to walk with her quad cane for 10-15 minutes    Patient Stated Goals to be  able to walk again    Currently in Pain? No/denies            [facility] PT Assessment - 03/05/15 1355    Precautions   Precautions None   Restrictions   Weight Bearing Restrictions No   Functional Tests   Functional tests Single leg stance;Sit to Stand   Single Leg Stance   Comments Rt:  8 seconds; Lt unable    Sit to Stand   Comments no UE 5 sit to stand in 24 seconds.    ROM / Strength   AROM / PROM / Strength AROM   AROM   Left Knee Extension 8  was 12   Left Knee Flexion 120  was 115    Strength   Left Hip Flexion 5/5  4+/5    Left Hip Extension 4-/5  was 4-/5    Left Hip ABduction 3/5  was 3-/5    Left Hip ADduction 5/5   Left Knee Flexion 3+/5  was 3-/5    Left Knee Extension 5/5  was 4/5    Left Ankle Dorsiflexion 4-/5   6 Minute Walk- Baseline   6 Minute Walk- Baseline --  1226 in 6' with quad cane.  [place] Adult PT Treatment/Exercise - 03/05/15 1617    Ambulation/Gait   Ambulation Distance (Feet) 1226 Feet   Assistive device Small based quad cane   Knee/Hip Exercises: Sidelying   Hip ABduction Strengthening;Right;10 reps   Knee/Hip Exercises: Prone   Hamstring Curl 10 reps   Hamstring Curl Limitations T-band green band   Hip Extension 10 reps                  PT Short Term Goals - 03/05/15 1630    PT SHORT TERM GOAL #1   Title I HEP   Time 2   Period Weeks   Status Achieved   PT SHORT TERM GOAL #2   Title Pt to be I in ambulation with rolling walker for 200 ft    Time 3   Period Weeks   Status Achieved   PT SHORT TERM GOAL #3   Title Pt to begin advance HEP ( closted chai)   Time 3   Period Weeks   Status Achieved           PT Long Term Goals - 03/05/15 1630    PT LONG TERM GOAL #1   Title Pt to be ambulating with a cane   Time 4   Period Weeks   Status Achieved   PT LONG TERM GOAL #2   Title Pt strength to be at least 4+/5 to be able to go up and down steps in a reciprocal manner.     Time 6   Period Weeks   Status On-going   PT LONG TERM GOAL #3   Title Pt to be walking in her home without an assistive device    Time 6   Period Weeks   Status Partially Met   PT LONG TERM GOAL #4   Title Pt to work out in the yard for 45 minutes without increased pain    Time 8   Period Weeks   Status On-going   PT LONG TERM GOAL #5   Title Pt to be able to SLS for 10 seconds to reduce risk of falling    Time 8   Period Weeks   Status Not Met               Plan - 03/05/15 1626    Clinical Impression Statement Pt reassessed today with improved strength in all mm.  Pt was tested on 5 sit to stand which pt is able to complete, however, average time to complete this task is 12.8 seconds.  Anything over 15 seconds is indicative of a fall risk.  Pt was also unable to SLS on eithre LE.  Pt will benefit from continued skilled therapy two times a week for the next 4 weeks  to focus on balance as well as strengtheing  her Lt gluteal mediu and maximus and her hamstring mm..  With main focus on balance to progress to ambulating without an assistive device.    PT Treatment/Interventions ADLs/Self Care Home Management;DME Instruction;Gait training;Stair training;Functional mobility training;Therapeutic activities;Therapeutic exercise;Balance training;Neuromuscular re-education;Patient/family education;Manual techniques   PT Next Visit Plan Continue to work towards goals and improve functional strength to baseline.  continue to work on balance actvities and gait without AD.   Do foto for G-code progress note done and sent today.         Problem List There are no active problems to display for this patient. Rayetta Humphrey, PT CLT [phone] 03/05/2015, 4:31 PM  [facility]  [place], [place], [address] Phone: [phone]   Fax:  [phone]  Name: Carol Gutierrez MRN: [id number] Date of Birth: Sep 15, 1935  Physical Therapy  Progress Note  Dates of Reporting Period: 01/25/2015  to 03/05/2015 - Objective Reports of Subjective Statement: see above  Objective Measurements: see above  Goal Update: see above  Plan: see above   Reason Skilled Services are Required: Improve balance to reduce risk of falling as well as progress to ambulating without an assistive device.    Rayetta Humphrey, [place] CLT [phone]

## 2015-03-05 NOTE — Patient Instructions (Signed)
1) Shoulder Protraction    Begin with elbows by your side, slowly "punch" straight out in front of you.. Repeat _12__times, __2-3__set/day.     2) Shoulder Flexion  Supine:     Standing:         Begin with arms at your side with thumbs pointed up, slowly raise both arms up and forward towards overhead. Repeat_12__times, _2-3__set/day.      3) Horizontal abduction/adduction  Supine:   Standing:           Begin with arms straight out in front of you, bring out to the side in at "T" shape. Keep arms straight entire time. Repeat _12___times, _2-3___sets/day.       4) Internal & External Rotation    *No band* -Stand with elbows at the side and elbows bent 90 degrees. Move your forearms away from your body, then bring back inward toward the body.  Repeat 12___times, _2-3__sets/day    5) Shoulder Abduction  Supine:     Standing:       Lying on your back begin with your arms flat on the table next to your side. Slowly move your arms out to the side so that they go overhead, in a jumping jack or snow angel movement. Repeat _12__times, _2-3__sets/day

## 2015-03-06 DIAGNOSIS — S42212A Unspecified displaced fracture of surgical neck of left humerus, initial encounter for closed fracture: Secondary | ICD-10-CM | POA: Diagnosis not present

## 2015-03-06 DIAGNOSIS — S42202P Unspecified fracture of upper end of left humerus, subsequent encounter for fracture with malunion: Secondary | ICD-10-CM | POA: Diagnosis not present

## 2015-03-06 DIAGNOSIS — S42202D Unspecified fracture of upper end of left humerus, subsequent encounter for fracture with routine healing: Secondary | ICD-10-CM | POA: Diagnosis not present

## 2015-03-06 DIAGNOSIS — Z7982 Long term (current) use of aspirin: Secondary | ICD-10-CM | POA: Diagnosis not present

## 2015-03-06 DIAGNOSIS — Z7902 Long term (current) use of antithrombotics/antiplatelets: Secondary | ICD-10-CM | POA: Diagnosis not present

## 2015-03-06 DIAGNOSIS — Z8673 Personal history of transient ischemic attack (TIA), and cerebral infarction without residual deficits: Secondary | ICD-10-CM | POA: Diagnosis not present

## 2015-03-06 DIAGNOSIS — Z9181 History of falling: Secondary | ICD-10-CM | POA: Diagnosis not present

## 2015-03-06 DIAGNOSIS — I10 Essential (primary) hypertension: Secondary | ICD-10-CM | POA: Diagnosis not present

## 2015-03-06 DIAGNOSIS — S82142D Displaced bicondylar fracture of left tibia, subsequent encounter for closed fracture with routine healing: Secondary | ICD-10-CM | POA: Diagnosis not present

## 2015-03-06 DIAGNOSIS — I639 Cerebral infarction, unspecified: Secondary | ICD-10-CM | POA: Diagnosis not present

## 2015-03-06 DIAGNOSIS — M810 Age-related osteoporosis without current pathological fracture: Secondary | ICD-10-CM | POA: Diagnosis not present

## 2015-03-07 ENCOUNTER — Encounter (HOSPITAL_COMMUNITY): Payer: Self-pay

## 2015-03-07 ENCOUNTER — Ambulatory Visit (HOSPITAL_COMMUNITY): Payer: Medicare Other | Admitting: Physical Therapy

## 2015-03-07 ENCOUNTER — Ambulatory Visit (HOSPITAL_COMMUNITY): Payer: Medicare Other | Attending: General Practice

## 2015-03-07 DIAGNOSIS — M25612 Stiffness of left shoulder, not elsewhere classified: Secondary | ICD-10-CM

## 2015-03-07 DIAGNOSIS — M6281 Muscle weakness (generalized): Secondary | ICD-10-CM | POA: Diagnosis not present

## 2015-03-07 DIAGNOSIS — X58XXXS Exposure to other specified factors, sequela: Secondary | ICD-10-CM | POA: Diagnosis not present

## 2015-03-07 DIAGNOSIS — M25512 Pain in left shoulder: Secondary | ICD-10-CM | POA: Insufficient documentation

## 2015-03-07 DIAGNOSIS — S82142G Displaced bicondylar fracture of left tibia, subsequent encounter for closed fracture with delayed healing: Secondary | ICD-10-CM

## 2015-03-07 DIAGNOSIS — M629 Disorder of muscle, unspecified: Secondary | ICD-10-CM | POA: Diagnosis not present

## 2015-03-07 DIAGNOSIS — M25562 Pain in left knee: Secondary | ICD-10-CM

## 2015-03-07 DIAGNOSIS — M6289 Other specified disorders of muscle: Secondary | ICD-10-CM

## 2015-03-07 DIAGNOSIS — R29898 Other symptoms and signs involving the musculoskeletal system: Secondary | ICD-10-CM

## 2015-03-07 DIAGNOSIS — S42202P Unspecified fracture of upper end of left humerus, subsequent encounter for fracture with malunion: Secondary | ICD-10-CM

## 2015-03-07 DIAGNOSIS — R269 Unspecified abnormalities of gait and mobility: Secondary | ICD-10-CM | POA: Diagnosis not present

## 2015-03-07 NOTE — Patient Instructions (Signed)
  Flexibility: Corner Stretch   Standing in corner with hands just above shoulder level and 2-3 feet  from corner, lean forward until a comfortable stretch is felt across chest. Hold _10___ seconds. Repeat _3___ times per set. Do __1__ sets per session. Do __3__ sessions per day.  http://orth.exer.us/342   Copyright  VHI. All rights reserved.   Scapular Retraction (Standing)   With arms at sides, pinch shoulder blades together. Repeat __10__ times per set. Do __1__ sets per session. Do _3___ sessions per day.    Posterior Capsule Stretch   Stand or sit, one arm across body so hand rests over opposite shoulder. Gently push on crossed elbow with other hand until stretch is felt in shoulder of crossed arm. Hold _10__ seconds.  Repeat _3__ times per session. Do _3__ sessions per day.  Copyright  VHI. All rights reserved.   Flexors Stretch, Standing   Stand near wall and slide arm up, with palm facing away from wall, by leaning toward wall. Hold _10__ seconds.  Repeat _3__ times per session. Do _1__ sessions per day.  Copyright  VHI. All rights reserved.

## 2015-03-07 NOTE — Therapy (Signed)
[place], [place], [address] Phone: [phone]   Fax:  [phone]  Occupational Therapy Treatment  Patient Details  Name: Carol Gutierrez MRN: [id number] Date of Birth: 1935-12-18 Referring Provider: Driscilla Moats  Encounter Date: 03/07/2015      OT End of Session - 03/07/15 1505    Visit Number 12   Number of Visits 13   Date for OT Re-Evaluation 03/26/15   Authorization Type 1-Medicare/Medicare A & B;  2- A[employer]   Authorization Time Period Before 20th visit   Authorization - Visit Number 12   Authorization - Number of Visits 20   OT Start Time 1300   OT Stop Time 1345   OT Time Calculation (min) 45 min   Activity Tolerance Patient tolerated treatment well   Behavior During Therapy [facility] for tasks assessed/performed      Past Medical History  Diagnosis Date  . Hypertension   . Arthritis     hands and back    Past Surgical History  Procedure Laterality Date  . Joint replacement  2008    left hip  . Colonoscopy N/A 09/08/2012    Procedure: COLONOSCOPY;  Surgeon: Rogene Houston, MD;  [facility];  Service: Endoscopy;  Laterality: N/A;  730-moved to 830 Ann to notify pt  . Total hip arthroplasty Left Left Hip Replacement    There were no vitals filed for this visit.  Visit Diagnosis:  Tight fascia  Stiffness of shoulder joint, left  Muscle weakness of left arm      Subjective Assessment - 03/07/15 1326    Subjective  S: [facility] [place] [facility] and do some [facility].    Currently in Pain? No/denies            [facility] OT Assessment - 03/07/15 1330    Assessment   Diagnosis left proximal humerus fracture   Precautions   Precautions None   Precaution Comments Progress as tolerated. Begin strengthening as able. Modalities PRN                  OT Treatments/Exercises (OP) - 03/07/15 1329    Exercises   Exercises Shoulder    Shoulder Exercises: Supine   Protraction PROM;5 reps;AROM;15 reps   Horizontal ABduction PROM;5 reps;AROM;15 reps   External Rotation PROM;5 reps;AROM;15 reps   Internal Rotation PROM;5 reps;AROM;15 reps   Flexion PROM;5 reps;AROM;15 reps   ABduction PROM;5 reps;AROM;15 reps   Shoulder Exercises: Seated   Protraction AROM;15 reps   Horizontal ABduction AROM;15 reps   Shoulder Exercises: Stretch   Corner Stretch 1 rep;10 seconds   Cross Chest Stretch 1 rep;10 seconds   Wall Stretch - Flexion 1 rep;10 seconds   Manual Therapy   Manual Therapy Myofascial release;Muscle Energy Technique   Myofascial Release Myofascial release to left upper arm, trapezius, and scapularis regions to decrease pain and fascial restrictions and increase joint range of motion.    Muscle Energy Technique Muscle energy technique to left UE to decrease muscle spasm and increase ROM.                 OT Education - 03/07/15 1504    Education provided Yes   Education Details Shoulder stretches   Person(s) Educated Patient   Methods Explanation;Demonstration;Handout;Verbal cues   Comprehension Verbalized understanding;Returned demonstration          OT Short Term Goals - 02/21/15 1332  OT SHORT TERM GOAL #1   Title Pt will be educated on and independent in HEP.    Time 3   Period Weeks   Status On-going   OT SHORT TERM GOAL #2   Title Pt will decrease pain to 3/10 in LUE.    Time 3   Period Weeks   Status On-going   OT SHORT TERM GOAL #3   Title Pt will decrease fascial restrictions from max to mod amount in LUE.    Time 3   Period Weeks   Status On-going   OT SHORT TERM GOAL #4   Title Pt will increase AROM to [facility] to increase ability to donn shirts without compensatory strategies.    Time 3   Period Weeks   Status On-going   OT SHORT TERM GOAL #5   Title Pt will increase strength to 3/5 to increase ability wash hair using BUE.    Time 3   Period Weeks   Status On-going            OT Long Term Goals - 01/26/15 1529    OT LONG TERM GOAL #1   Title Pt will return to prior level of functioning and independence in ADL and leisure tasks.    Time 6   Period Weeks   Status On-going   OT LONG TERM GOAL #2   Title Pt will decrease pain to 1/10 or less during daily tasks.    Time 6   Period Weeks   Status On-going   OT LONG TERM GOAL #3   Title Pt will decrease fascial restrictions from mod to min amounts or less in LUE.    Time 6   Period Weeks   Status On-going   OT LONG TERM GOAL #4   Title Pt will increase AROM to WNL to increase ability to reach into overhead cabinets.    Time 6   Period Weeks   Status On-going   OT LONG TERM GOAL #5   Title Pt will increase strength to 4/5 to increase ability to hold lightweight objects.    Time 6   Period Weeks   Status On-going               Plan - 03/07/15 1505    Clinical Impression Statement A: Pt brought MD order to continue therapy 3X a week for 8 more weeks. MD recommended pool therapy. [facility]   Plan P: Add left/right therapy ball. Cont with muscle energy technique to increase functional mobility and ROM of LUE.        Problem List There are no active problems to display for this patient.   Ailene Ravel, OTR/L,CBIS  [phone]  03/07/2015, 3:26 PM  [facility] [address] [place], [place], [address] Phone: [phone]   Fax:  [phone]  Name: Carol Gutierrez MRN: [id number] Date of Birth: 12-21-1935

## 2015-03-07 NOTE — Therapy (Signed)
[place], [place], [address] Phone: [phone]   Fax:  [phone]  Physical Therapy Treatment  Patient Details  Name: Carol Gutierrez MRN: [id number] Date of Birth: 09-Feb-1936 No Data Recorded  Encounter Date: 03/07/2015      PT End of Session - 03/07/15 1459    Visit Number 10   Number of Visits 18   Date for PT Re-Evaluation 04/04/15   Authorization Type medicare    Authorization Time Period Authorized 01/25/2015-03/27/2015   Authorization - Visit Number 10   Authorization - Number of Visits 20   PT Start Time 4270   PT Stop Time 1440   PT Time Calculation (min) 48 min   Equipment Utilized During Treatment Gait belt   Activity Tolerance Patient tolerated treatment well   Behavior During Therapy [facility] for tasks assessed/performed      Past Medical History  Diagnosis Date  . Hypertension   . Arthritis     hands and back    Past Surgical History  Procedure Laterality Date  . Joint replacement  2008    left hip  . Colonoscopy N/A 09/08/2012    Procedure: COLONOSCOPY;  Surgeon: Rogene Houston, MD;  [facility];  Service: Endoscopy;  Laterality: N/A;  730-moved to 830 Ann to notify pt  . Total hip arthroplasty Left Left Hip Replacement    There were no vitals filed for this visit.  Visit Diagnosis:  Tight fascia  Stiffness of shoulder joint, left  Muscle weakness of left arm  Abnormal gait  Tibial plateau fracture, left, closed, with delayed healing, subsequent encounter  Weakness of left leg  Proximal humerus fracture, left, with malunion, subsequent encounter  Pain in joint involving left lower leg  Pain in left shoulder      Subjective Assessment - 03/07/15 1457    Subjective Pt states she went to MD yesterday and he was pleased with her progress.  States she is now driving.  Comes with orders to continue 3X week for 8 weeks.  Currently without pain and reports she has  been practicing SLS at home.    Currently in Pain? No/denies            [facility] PT Assessment - 03/07/15 1421    Observation/Other Assessments   Focus on Therapeutic Outcomes (FOTO)  functional status 50% was 28%                     [facility] Adult PT Treatment/Exercise - 03/07/15 1404    Knee/Hip Exercises: Stretches   Gastroc Stretch 3 reps;30 seconds   Gastroc Stretch Limitations slant board   Knee/Hip Exercises: Standing   Knee Flexion Both;10 reps   Forward Lunges Left;10 reps   Forward Lunges Limitations 4" box   Side Lunges Both;10 reps   Side Lunges Limitations 4" box   Hip Abduction Both;10 reps   Hip Extension Both;10 reps   Lateral Step Up Both;15 reps;Step Height: 4";Hand Hold: 1   Forward Step Up Both;15 reps;Step Height: 4";Hand Hold: 1   Step Down Both;15 reps;Step Height: 4";Hand Hold: 1   SLS max of 6" Rt, 0" Lt   SLS with Vectors 5X5" each with 1 HHA   Other Standing Knee Exercises sidestepping, retro and tandem 1RT each                  PT Short Term Goals - 03/05/15 1630    PT SHORT TERM  GOAL #1   Title I HEP   Time 2   Period Weeks   Status Achieved   PT SHORT TERM GOAL #2   Title Pt to be I in ambulation with rolling walker for 200 ft    Time 3   Period Weeks   Status Achieved   PT SHORT TERM GOAL #3   Title Pt to begin advance HEP ( closted chai)   Time 3   Period Weeks   Status Achieved           PT Long Term Goals - 03/05/15 1630    PT LONG TERM GOAL #1   Title Pt to be ambulating with a cane   Time 4   Period Weeks   Status Achieved   PT LONG TERM GOAL #2   Title Pt strength to be at least 4+/5 to be able to go up and down steps in a reciprocal manner.    Time 6   Period Weeks   Status On-going   PT LONG TERM GOAL #3   Title Pt to be walking in her home without an assistive device    Time 6   Period Weeks   Status Partially Met   PT LONG TERM GOAL #4   Title Pt to work out in the yard for 45 minutes  without increased pain    Time 8   Period Weeks   Status On-going   PT LONG TERM GOAL #5   Title Pt to be able to SLS for 10 seconds to reduce risk of falling    Time 8   Period Weeks   Status Not Met               Plan - 03-12-15 1459    Clinical Impression Statement FOTO completed today with assitance from Hilma Favors, PT.  PT with higher score of 50% functional limitation , at 22% improvement from initial visit.  Progressed balance today to include vector stance and walking balance tasks.  Added hip extension and abduction in standing as well as hamstring curls.  Pt requried verbal and tactile cues from therapist to complete in correct form.    PT Next Visit Plan Continue to work towards goals and improve functional strength to baseline.  continue to work on balance actvities and gait without AD.           G-Codes - 2015/03/12 1614    Functional Limitation Mobility: Walking and moving around   Mobility: Walking and Moving Around Current Status [phone]) At least 40 percent but less than 60 percent impaired, limited or restricted   Mobility: Walking and Moving Around Goal Status [phone]) At least 20 percent but less than 40 percent impaired, limited or restricted      Problem List There are no active problems to display for this patient.   Teena Irani, PTA/CLT [phone]  2015-03-12, 4:15 PM  [facility] [address] [place], [place], [address] Phone: [phone]   Fax:  [phone]  Name: Carol Gutierrez MRN: [id number] Date of Birth: 08-06-1935   Rayetta Humphrey, [place] CLT [phone]

## 2015-03-12 ENCOUNTER — Encounter (HOSPITAL_COMMUNITY): Payer: Self-pay

## 2015-03-12 ENCOUNTER — Ambulatory Visit (HOSPITAL_COMMUNITY): Payer: Medicare Other | Admitting: Physical Therapy

## 2015-03-12 ENCOUNTER — Ambulatory Visit (HOSPITAL_COMMUNITY): Payer: Medicare Other

## 2015-03-12 DIAGNOSIS — R29898 Other symptoms and signs involving the musculoskeletal system: Secondary | ICD-10-CM

## 2015-03-12 DIAGNOSIS — M6289 Other specified disorders of muscle: Secondary | ICD-10-CM

## 2015-03-12 DIAGNOSIS — S82142G Displaced bicondylar fracture of left tibia, subsequent encounter for closed fracture with delayed healing: Secondary | ICD-10-CM | POA: Diagnosis not present

## 2015-03-12 DIAGNOSIS — M6281 Muscle weakness (generalized): Secondary | ICD-10-CM

## 2015-03-12 DIAGNOSIS — M25562 Pain in left knee: Secondary | ICD-10-CM | POA: Diagnosis not present

## 2015-03-12 DIAGNOSIS — M629 Disorder of muscle, unspecified: Secondary | ICD-10-CM | POA: Diagnosis not present

## 2015-03-12 DIAGNOSIS — M25612 Stiffness of left shoulder, not elsewhere classified: Secondary | ICD-10-CM

## 2015-03-12 DIAGNOSIS — R269 Unspecified abnormalities of gait and mobility: Secondary | ICD-10-CM

## 2015-03-12 NOTE — Therapy (Signed)
[place], [place], [address] Phone: [phone]   Fax:  [phone]  Physical Therapy Treatment  Patient Details  Name: Carol Gutierrez MRN: [id number] Date of Birth: 1935/11/28 No Data Recorded  Encounter Date: 03/12/2015      PT End of Session - 03/12/15 1431    Visit Number 11   Number of Visits 18   Date for PT Re-Evaluation 04/04/15   Authorization Time Period Authorized 01/25/2015-03/27/2015   Authorization - Visit Number 11   Authorization - Number of Visits 20   PT Start Time 4492   PT Stop Time 1430   PT Time Calculation (min) 45 min   Equipment Utilized During Treatment Gait belt   Activity Tolerance Patient tolerated treatment well   Behavior During Therapy [facility] for tasks assessed/performed      Past Medical History  Diagnosis Date  . Hypertension   . Arthritis     hands and back    Past Surgical History  Procedure Laterality Date  . Joint replacement  2008    left hip  . Colonoscopy N/A 09/08/2012    Procedure: COLONOSCOPY;  Surgeon: Rogene Houston, MD;  [facility];  Service: Endoscopy;  Laterality: N/A;  730-moved to 830 Ann to notify pt  . Total hip arthroplasty Left Left Hip Replacement    There were no vitals filed for this visit.  Visit Diagnosis:  Tight fascia  Stiffness of shoulder joint, left  Muscle weakness of left arm  Abnormal gait  Tibial plateau fracture, left, closed, with delayed healing, subsequent encounter  Weakness of left leg      Subjective Assessment - 03/12/15 1400    Subjective Pt states she is walking on her driveway for 3-4 times; she feels that she is going faster.    Pertinent History CVA 12/10/2014; HTN , osteroporosis,    Currently in Pain? Yes   Pain Score 5    Pain Location Knee   Pain Orientation Left   Pain Descriptors / Indicators Aching                         [facility] Adult PT Treatment/Exercise - 03/12/15  1402    Ambulation/Gait   Ambulation/Gait Yes   Ambulation/Gait Assistance 5: Supervision   Ambulation Distance (Feet) 226 Feet   Assistive device None   Knee/Hip Exercises: Standing   Heel Raises 15 reps   Heel Raises Limitations  no UE    Functional Squat 15 reps   Functional Squat Limitations no UE   Stairs 7" 2 RT    SLS with Vectors 3 reps of 10" vector    Other Standing Knee Exercises tandem and sidestep over hurdles on balnace beam x 2 RT; retro x 2 RT    Knee/Hip Exercises: Sidelying   Hip ABduction Strengthening;Left;10 reps   Other Sidelying Knee/Hip Exercises hip circles forward and backward x 10 rep Lt LE    Knee/Hip Exercises: Prone   Hip Extension Strengthening;Both;10 reps                  PT Short Term Goals - 03/05/15 1630    PT SHORT TERM GOAL #1   Title I HEP   Time 2   Period Weeks   Status Achieved   PT SHORT TERM GOAL #2   Title Pt to be I in ambulation with rolling walker for 200 ft    Time 3  Period Weeks   Status Achieved   PT SHORT TERM GOAL #3   Title Pt to begin advance HEP ( closted chai)   Time 3   Period Weeks   Status Achieved           PT Long Term Goals - 03/05/15 1630    PT LONG TERM GOAL #1   Title Pt to be ambulating with a cane   Time 4   Period Weeks   Status Achieved   PT LONG TERM GOAL #2   Title Pt strength to be at least 4+/5 to be able to go up and down steps in a reciprocal manner.    Time 6   Period Weeks   Status On-going   PT LONG TERM GOAL #3   Title Pt to be walking in her home without an assistive device    Time 6   Period Weeks   Status Partially Met   PT LONG TERM GOAL #4   Title Pt to work out in the yard for 45 minutes without increased pain    Time 8   Period Weeks   Status On-going   PT LONG TERM GOAL #5   Title Pt to be able to SLS for 10 seconds to reduce risk of falling    Time 8   Period Weeks   Status Not Met               Plan - 03/12/15 1432    Clinical  Impression Statement Pt continues to have a trendelenburg gt therefore advised pt to continue to use her cane with ambulation. Pt encouraged to complete hip abduction exercises 3 times a day along with hip circles.     PT Next Visit Plan Pt will need to strengthen glut medius prior to ambulating without assistive device.         Problem List There are no active problems to display for this patient. Cynthia Russell, PT CLT [phone] 03/12/2015, 2:35 PM  [place] [facility] [address] [place], [place], [address] Phone: [phone]   Fax:  [phone]  Name: Carol Gutierrez MRN: [id number] Date of Birth: 04/19/1936     

## 2015-03-12 NOTE — Therapy (Signed)
[place], [place], [address] Phone: [phone]   Fax:  [phone]  Occupational Therapy Treatment  Patient Details  Name: Carol Gutierrez MRN: [id number] Date of Birth: 1935-09-24 Referring Provider: Driscilla Moats  Encounter Date: 03/12/2015      OT End of Session - 03/12/15 1355    Visit Number 13   Number of Visits 20   Date for OT Re-Evaluation 03/26/15   Authorization Type 1-Medicare/Medicare A & B;  2- [employer]   Authorization Time Period Before 20th visit   Authorization - Visit Number 50   Authorization - Number of Visits 20   OT Start Time 1300   OT Stop Time 1345   OT Time Calculation (min) 45 min   Activity Tolerance Patient tolerated treatment well   Behavior During Therapy [facility] for tasks assessed/performed      Past Medical History  Diagnosis Date  . Hypertension   . Arthritis     hands and back    Past Surgical History  Procedure Laterality Date  . Joint replacement  2008    left hip  . Colonoscopy N/A 09/08/2012    Procedure: COLONOSCOPY;  Surgeon: Rogene Houston, MD;  [facility];  Service: Endoscopy;  Laterality: N/A;  730-moved to 830 Ann to notify pt  . Total hip arthroplasty Left Left Hip Replacement    There were no vitals filed for this visit.  Visit Diagnosis:  Tight fascia  Stiffness of shoulder joint, left  Muscle weakness of left arm      Subjective Assessment - 03/12/15 1320    Subjective  S: It's just sore from working it but it's not painful.   Currently in Pain? No/denies            [facility] OT Assessment - 03/12/15 1321    Assessment   Diagnosis left proximal humerus fracture   Precautions   Precautions None   Precaution Comments Progress as tolerated. Begin strengthening as able. Modalities PRN                  OT Treatments/Exercises (OP) - 03/12/15 1321    Exercises   Exercises Shoulder   Shoulder Exercises: Supine   Protraction PROM;5 reps;AROM;15 reps   Horizontal ABduction PROM;5 reps;AROM;15 reps   External Rotation PROM;5 reps;AROM;15 reps   Internal Rotation PROM;5 reps;AROM;15 reps   Flexion PROM;5 reps;AROM;15 reps   ABduction PROM;5 reps;AROM;15 reps   Shoulder Exercises: Standing   Extension Theraband;12 reps   Theraband Level (Shoulder Extension) Level 2 (Red)   Row Theraband;12 reps   Theraband Level (Shoulder Row) Level 2 (Red)   Retraction Theraband;12 reps   Theraband Level (Shoulder Retraction) Level 2 (Red)   Manual Therapy   Manual Therapy Myofascial release;Muscle Energy Technique   Myofascial Release Myofascial release to left upper arm, trapezius, and scapularis regions to decrease pain and fascial restrictions and increase joint range of motion.    Muscle Energy Technique Muscle energy technique to left UE to decrease muscle spasm and increase ROM.                   OT Short Term Goals - 02/21/15 1332    OT SHORT TERM GOAL #1   Title Pt will be educated on and independent in HEP.    Time 3   Period Weeks   Status On-going   OT SHORT TERM GOAL #2   Title Pt will decrease pain to 3/10  in LUE.    Time 3   Period Weeks   Status On-going   OT SHORT TERM GOAL #3   Title Pt will decrease fascial restrictions from max to mod amount in LUE.    Time 3   Period Weeks   Status On-going   OT SHORT TERM GOAL #4   Title Pt will increase AROM to [facility] to increase ability to donn shirts without compensatory strategies.    Time 3   Period Weeks   Status On-going   OT SHORT TERM GOAL #5   Title Pt will increase strength to 3/5 to increase ability wash hair using BUE.    Time 3   Period Weeks   Status On-going           OT Long Term Goals - 01/26/15 1529    OT LONG TERM GOAL #1   Title Pt will return to prior level of functioning and independence in ADL and leisure tasks.    Time 6   Period Weeks   Status On-going   OT LONG TERM GOAL #2   Title Pt will  decrease pain to 1/10 or less during daily tasks.    Time 6   Period Weeks   Status On-going   OT LONG TERM GOAL #3   Title Pt will decrease fascial restrictions from mod to min amounts or less in LUE.    Time 6   Period Weeks   Status On-going   OT LONG TERM GOAL #4   Title Pt will increase AROM to WNL to increase ability to reach into overhead cabinets.    Time 6   Period Weeks   Status On-going   OT LONG TERM GOAL #5   Title Pt will increase strength to 4/5 to increase ability to hold lightweight objects.    Time 6   Period Weeks   Status On-going               Plan - 03/12/15 1356    Clinical Impression Statement A: Pt had great response to muscle energy technique although she continues to have increased pain at end stretch. Still showing signs of limited joint mobility.. DId not add therapy ball circles this date.    Plan P: Add therapy ball circles and continue with muscle energy technique.        Problem List There are no active problems to display for this patient.   Carol Gutierrez, OTR/L,CBIS  [phone]  03/12/2015, 1:59 PM  [place] [address] [place], [place], [address] Phone: [phone]   Fax:  [phone]  Name: Carol Gutierrez MRN: [id number] Date of Birth: [birth year]

## 2015-03-14 ENCOUNTER — Ambulatory Visit (HOSPITAL_COMMUNITY): Payer: Medicare Other | Admitting: Physical Therapy

## 2015-03-14 ENCOUNTER — Ambulatory Visit (HOSPITAL_COMMUNITY): Payer: Medicare Other | Admitting: Occupational Therapy

## 2015-03-14 ENCOUNTER — Encounter (HOSPITAL_COMMUNITY): Payer: Self-pay | Admitting: Occupational Therapy

## 2015-03-14 DIAGNOSIS — M6289 Other specified disorders of muscle: Secondary | ICD-10-CM

## 2015-03-14 DIAGNOSIS — R29898 Other symptoms and signs involving the musculoskeletal system: Secondary | ICD-10-CM | POA: Diagnosis not present

## 2015-03-14 DIAGNOSIS — M6281 Muscle weakness (generalized): Secondary | ICD-10-CM | POA: Diagnosis not present

## 2015-03-14 DIAGNOSIS — R269 Unspecified abnormalities of gait and mobility: Secondary | ICD-10-CM

## 2015-03-14 DIAGNOSIS — S82142G Displaced bicondylar fracture of left tibia, subsequent encounter for closed fracture with delayed healing: Secondary | ICD-10-CM

## 2015-03-14 DIAGNOSIS — M629 Disorder of muscle, unspecified: Secondary | ICD-10-CM | POA: Diagnosis not present

## 2015-03-14 DIAGNOSIS — M25562 Pain in left knee: Secondary | ICD-10-CM | POA: Diagnosis not present

## 2015-03-14 DIAGNOSIS — M25612 Stiffness of left shoulder, not elsewhere classified: Secondary | ICD-10-CM

## 2015-03-14 NOTE — Therapy (Signed)
[place], [place], [address] Phone: [phone]   Fax:  [phone]  Physical Therapy Treatment  Patient Details  Name: Carol Gutierrez MRN: [id number] Date of Birth: [birth year] No Data Recorded  Encounter Date: 03/14/2015      PT End of Session - 03/14/15 1456    Visit Number 12   Number of Visits 18   Date for PT Re-Evaluation 04/04/15   Authorization Type medicare    Authorization Time Period Authorized 01/25/2015-03/27/2015   Authorization - Visit Number 12   Authorization - Number of Visits 20   PT Start Time 1350   PT Stop Time 1435   PT Time Calculation (min) 45 min   Equipment Utilized During Treatment Gait belt   Activity Tolerance Patient tolerated treatment well   Behavior During Therapy [facility] for tasks assessed/performed      Past Medical History  Diagnosis Date  . Hypertension   . Arthritis     hands and back    Past Surgical History  Procedure Laterality Date  . Joint replacement  2008    left hip  . Colonoscopy N/A 09/08/2012    Procedure: COLONOSCOPY;  Surgeon: Rogene Houston, MD;  [facility];  Service: Endoscopy;  Laterality: N/A;  730-moved to 830 Ann to notify pt  . Total hip arthroplasty Left Left Hip Replacement    There were no vitals filed for this visit.  Visit Diagnosis:  Abnormal gait  Tibial plateau fracture, left, closed, with delayed healing, subsequent encounter  Weakness of left leg      Subjective Assessment - 03/14/15 1458    Subjective PT states she is doing well today.  States she has been doing her HEP consistently and walking.   Currently in Pain? No/denies                         [facility] Adult PT Treatment/Exercise - 03/14/15 1352    Knee/Hip Exercises: Stretches   Gastroc Stretch 3 reps;30 seconds   Gastroc Stretch Limitations slant board   Knee/Hip Exercises: Standing   Heel Raises 15 reps   Heel Raises Limitations   no UE    Knee Flexion Both;10 reps   Forward Lunges Left;15 reps   Forward Lunges Limitations 4" box   Side Lunges Both;15 reps   Side Lunges Limitations 4" box   Hip Abduction Both;15 reps   Hip Extension Both;15 reps   Lateral Step Up Both;10 reps;Step Height: 6";Hand Hold: 1   Lateral Step Up Limitations 6"   Forward Step Up Both;10 reps;Step Height: 6";Hand Hold: 1   Forward Step Up Limitations 6"   Gait Training without AD X 220 feet with VC's   Other Standing Knee Exercises tandem and sidestep over hurdles (6"/12") on balance beam x 2 RT; retro x 2 RT                   PT Short Term Goals - 03/05/15 1630    PT SHORT TERM GOAL #1   Title I HEP   Time 2   Period Weeks   Status Achieved   PT SHORT TERM GOAL #2   Title Pt to be I in ambulation with rolling walker for 200 ft    Time 3   Period Weeks   Status Achieved   PT SHORT TERM GOAL #3   Title Pt to begin advance HEP ( closted chai)  Time 3   Period Weeks   Status Achieved           PT Long Term Goals - 03/05/15 1630    PT LONG TERM GOAL #1   Title Pt to be ambulating with a cane   Time 4   Period Weeks   Status Achieved   PT LONG TERM GOAL #2   Title Pt strength to be at least 4+/5 to be able to go up and down steps in a reciprocal manner.    Time 6   Period Weeks   Status On-going   PT LONG TERM GOAL #3   Title Pt to be walking in her home without an assistive device    Time 6   Period Weeks   Status Partially Met   PT LONG TERM GOAL #4   Title Pt to work out in the yard for 45 minutes without increased pain    Time 8   Period Weeks   Status On-going   PT LONG TERM GOAL #5   Title Pt to be able to SLS for 10 seconds to reduce risk of falling    Time 8   Period Weeks   Status Not Met               Plan - 03/14/15 1451    Clinical Impression Statement Able to increase reps of lunges and step height with step ups.  Pt requires constant tactile cues while performing lunges to  do in correct form.  Cues also required with ambulation to increase stride and keep feet in neutral.  Improved ablilty to clear hurdles with tandem/side stepping.  PT required one seated rest break during session today.    PT Next Visit Plan Continue to increase gluteal strength and stabiltiy to increase independence with ambulation.        Problem List There are no active problems to display for this patient.  Teena Irani, PTA/CLT [phone] 03/14/2015, 2:59 PM  [place] [address] [place], [place], [address] Phone: [phone]   Fax:  [phone]  Name: KALIANN CORYELL MRN: [id number] Date of Birth: [birth year]

## 2015-03-14 NOTE — Therapy (Signed)
[place], [place], [address] Phone: [phone]   Fax:  [phone]  Occupational Therapy Treatment  Patient Details  Name: Carol Gutierrez MRN: [id number] Date of Birth: 1935-06-22 Referring Provider: Driscilla Moats  Encounter Date: 03/14/2015      OT End of Session - 03/14/15 1424    Visit Number 14   Number of Visits 20   Date for OT Re-Evaluation 03/26/15   Authorization Type 1-Medicare/Medicare A & B;  2- [employer]   Authorization Time Period Before 20th visit   Authorization - Visit Number 59   Authorization - Number of Visits 20   OT Start Time 1300   OT Stop Time 1345   OT Time Calculation (min) 45 min   Activity Tolerance Patient tolerated treatment well   Behavior During Therapy [facility] for tasks assessed/performed      Past Medical History  Diagnosis Date  . Hypertension   . Arthritis     hands and back    Past Surgical History  Procedure Laterality Date  . Joint replacement  2008    left hip  . Colonoscopy N/A 09/08/2012    Procedure: COLONOSCOPY;  Surgeon: Rogene Houston, MD;  [facility];  Service: Endoscopy;  Laterality: N/A;  730-moved to 830 Ann to notify pt  . Total hip arthroplasty Left Left Hip Replacement    There were no vitals filed for this visit.  Visit Diagnosis:  Tight fascia  Stiffness of shoulder joint, left  Muscle weakness of left arm      Subjective Assessment - 03/14/15 1257    Subjective  S: It hurts when you push it out to the side but I found out it doesn't hurt as bad if I push it past that spot.    Currently in Pain? No/denies            [facility] OT Assessment - 03/14/15 1257    Assessment   Diagnosis left proximal humerus fracture   Precautions   Precautions None   Precaution Comments Progress as tolerated. Begin strengthening as able. Modalities PRN                  OT Treatments/Exercises (OP) - 03/14/15 1303    Exercises   Exercises Shoulder   Shoulder Exercises: Supine   Protraction PROM;5 reps;AROM;15 reps   Horizontal ABduction PROM;5 reps;AROM;15 reps   External Rotation PROM;5 reps;AROM;15 reps   Internal Rotation PROM;5 reps;AROM;15 reps   Flexion PROM;5 reps;AROM;15 reps   ABduction PROM;5 reps;AROM;15 reps   Shoulder Exercises: Seated   Protraction AROM;15 reps   Horizontal ABduction AROM;12 reps   External Rotation AROM;15 reps   Internal Rotation AROM;15 reps   Flexion AROM;15 reps   Abduction AROM;15 reps   Shoulder Exercises: Therapy Ball   Right/Left 5 reps   Shoulder Exercises: ROM/Strengthening   Proximal Shoulder Strengthening, Supine 12X each no rest break   Shoulder Exercises: Stretch   Wall Stretch - Flexion 2 reps;10 seconds   Manual Therapy   Manual Therapy Myofascial release;Muscle Energy Technique   Myofascial Release Myofascial release to left upper arm, trapezius, and scapularis regions to decrease pain and fascial restrictions and increase joint range of motion.    Muscle Energy Technique Muscle energy technique to left UE to decrease muscle spasm and increase ROM.                   OT Short Term Goals - 02/21/15 1332  OT SHORT TERM GOAL #1   Title Pt will be educated on and independent in HEP.    Time 3   Period Weeks   Status On-going   OT SHORT TERM GOAL #2   Title Pt will decrease pain to 3/10 in LUE.    Time 3   Period Weeks   Status On-going   OT SHORT TERM GOAL #3   Title Pt will decrease fascial restrictions from max to mod amount in LUE.    Time 3   Period Weeks   Status On-going   OT SHORT TERM GOAL #4   Title Pt will increase AROM to [facility] to increase ability to donn shirts without compensatory strategies.    Time 3   Period Weeks   Status On-going   OT SHORT TERM GOAL #5   Title Pt will increase strength to 3/5 to increase ability wash hair using BUE.    Time 3   Period Weeks   Status On-going           OT Long Term Goals -  01/26/15 1529    OT LONG TERM GOAL #1   Title Pt will return to prior level of functioning and independence in ADL and leisure tasks.    Time 6   Period Weeks   Status On-going   OT LONG TERM GOAL #2   Title Pt will decrease pain to 1/10 or less during daily tasks.    Time 6   Period Weeks   Status On-going   OT LONG TERM GOAL #3   Title Pt will decrease fascial restrictions from mod to min amounts or less in LUE.    Time 6   Period Weeks   Status On-going   OT LONG TERM GOAL #4   Title Pt will increase AROM to WNL to increase ability to reach into overhead cabinets.    Time 6   Period Weeks   Status On-going   OT LONG TERM GOAL #5   Title Pt will increase strength to 4/5 to increase ability to hold lightweight objects.    Time 6   Period Weeks   Status On-going               Plan - 03/14/15 1425    Clinical Impression Statement A: Resumed therapy ball circles this session. Pt continues to have good response to muscles energy technique, pt reports increased pain and "catching" sensation around posterior deltoid region during ER stretch both when stretched in adduction and abduction.    Plan P: Resume theraband exercises. Add rhythmic stabilization in supine.         Problem List There are no active problems to display for this patient.   [name], OTR/L  [phone]  03/14/2015, 2:30 PM  [place] [address] [place], [place], [address] Phone: [phone]   Fax:  [phone]  Name: Carol Gutierrez MRN: [id number] Date of Birth: 1935/05/10

## 2015-03-19 ENCOUNTER — Ambulatory Visit (HOSPITAL_COMMUNITY): Payer: Medicare Other

## 2015-03-19 ENCOUNTER — Ambulatory Visit (HOSPITAL_COMMUNITY): Payer: Medicare Other | Admitting: Physical Therapy

## 2015-03-19 ENCOUNTER — Encounter (HOSPITAL_COMMUNITY): Payer: Self-pay

## 2015-03-19 DIAGNOSIS — R29898 Other symptoms and signs involving the musculoskeletal system: Secondary | ICD-10-CM

## 2015-03-19 DIAGNOSIS — M6281 Muscle weakness (generalized): Secondary | ICD-10-CM

## 2015-03-19 DIAGNOSIS — M629 Disorder of muscle, unspecified: Secondary | ICD-10-CM | POA: Diagnosis not present

## 2015-03-19 DIAGNOSIS — M25612 Stiffness of left shoulder, not elsewhere classified: Secondary | ICD-10-CM | POA: Diagnosis not present

## 2015-03-19 DIAGNOSIS — M25562 Pain in left knee: Secondary | ICD-10-CM | POA: Diagnosis not present

## 2015-03-19 DIAGNOSIS — S82142G Displaced bicondylar fracture of left tibia, subsequent encounter for closed fracture with delayed healing: Secondary | ICD-10-CM

## 2015-03-19 DIAGNOSIS — R269 Unspecified abnormalities of gait and mobility: Secondary | ICD-10-CM

## 2015-03-19 NOTE — Therapy (Signed)
[place], [place], [address] Phone: [phone]   Fax:  [phone]  Occupational Therapy Treatment  Patient Details  Name: Carol Gutierrez MRN: [id number] Date of Birth: 11-01-1935 Referring Provider: Driscilla Moats  Encounter Date: 03/19/2015      OT End of Session - 03/19/15 1505    Visit Number 15   Number of Visits 20   Date for OT Re-Evaluation 03/26/15   Authorization Type 1-Medicare/Medicare A & B;  2- A[employer]   Authorization Time Period Before 20th visit   Authorization - Visit Number 15   Authorization - Number of Visits 20   OT Start Time 1321   OT Stop Time 1345   OT Time Calculation (min) 24 min   Activity Tolerance Patient tolerated treatment well   Behavior During Therapy [facility] for tasks assessed/performed      Past Medical History  Diagnosis Date  . Hypertension   . Arthritis     hands and back    Past Surgical History  Procedure Laterality Date  . Joint replacement  2008    left hip  . Colonoscopy N/A 09/08/2012    Procedure: COLONOSCOPY;  Surgeon: Rogene Houston, MD;  [facility];  Service: Endoscopy;  Laterality: N/A;  730-moved to 830 Ann to notify pt  . Total hip arthroplasty Left Left Hip Replacement    There were no vitals filed for this visit.  Visit Diagnosis:  Stiffness of shoulder joint, left  Muscle weakness of left arm      Subjective Assessment - 03/19/15 1330    Subjective  S: When my shoulder gets to hurting I can't do much with it.    Currently in Pain? No/denies            [facility] OT Assessment - 03/19/15 1331    Assessment   Diagnosis left proximal humerus fracture   Precautions   Precautions None   Precaution Comments Progress as tolerated. Begin strengthening as able. Modalities PRN                  OT Treatments/Exercises (OP) - 03/19/15 1331    Exercises   Exercises Shoulder   Shoulder Exercises: Supine   Protraction  PROM;5 reps;AROM;15 reps   Horizontal ABduction PROM;5 reps;AROM;15 reps   External Rotation PROM;5 reps;AROM;15 reps   Internal Rotation PROM;5 reps;AROM;15 reps   Flexion PROM;5 reps;AROM;15 reps   ABduction PROM;5 reps;AROM;15 reps   Shoulder Exercises: Standing   Extension Theraband;12 reps   Theraband Level (Shoulder Extension) Level 2 (Red)   Row Theraband;12 reps   Theraband Level (Shoulder Row) Level 2 (Red)   Retraction Theraband;12 reps   Theraband Level (Shoulder Retraction) Level 2 (Red)                  OT Short Term Goals - 02/21/15 1332    OT SHORT TERM GOAL #1   Title Pt will be educated on and independent in HEP.    Time 3   Period Weeks   Status On-going   OT SHORT TERM GOAL #2   Title Pt will decrease pain to 3/10 in LUE.    Time 3   Period Weeks   Status On-going   OT SHORT TERM GOAL #3   Title Pt will decrease fascial restrictions from max to mod amount in LUE.    Time 3   Period Weeks   Status On-going   OT SHORT TERM GOAL #4  Title Pt will increase AROM to [facility] to increase ability to donn shirts without compensatory strategies.    Time 3   Period Weeks   Status On-going   OT SHORT TERM GOAL #5   Title Pt will increase strength to 3/5 to increase ability wash hair using BUE.    Time 3   Period Weeks   Status On-going           OT Long Term Goals - 01/26/15 1529    OT LONG TERM GOAL #1   Title Pt will return to prior level of functioning and independence in ADL and leisure tasks.    Time 6   Period Weeks   Status On-going   OT LONG TERM GOAL #2   Title Pt will decrease pain to 1/10 or less during daily tasks.    Time 6   Period Weeks   Status On-going   OT LONG TERM GOAL #3   Title Pt will decrease fascial restrictions from mod to min amounts or less in LUE.    Time 6   Period Weeks   Status On-going   OT LONG TERM GOAL #4   Title Pt will increase AROM to W[facility] to increase ability to reach into overhead cabinets.    Time 6    Period Weeks   Status On-going   OT LONG TERM GOAL #5   Title Pt will increase strength to 4/5 to increase ability to hold lightweight objects.    Time 6   Period Weeks   Status On-going               Plan - 03/19/15 1506    Clinical Impression Statement A: Pt arrived late to session. Manual therapy not completed this date. Unable to add rthymic stabilization in supine this date due to time constraint.    Plan P: Add rthymic stabilzation in supine.        Problem List There are no active problems to display for this patient.   Ailene Ravel, OTR/L,CBIS  [phone]  03/19/2015, 3:09 PM  [place] [address] [place], [place], [address] Phone: [phone]   Fax:  [phone]  Name: Carol Gutierrez MRN: [id number] Date of Birth: 04-14-1936

## 2015-03-19 NOTE — Therapy (Signed)
Carol Gutierrez, [place], [address] Phone: [phone]   Fax:  [phone]  Physical Therapy Treatment  Patient Details  Name: Carol Gutierrez MRN: [id number] Date of Birth: 03/01/1936 No Data Recorded  Encounter Date: 03/19/2015      PT End of Session - 03/19/15 1436    Visit Number 13   Number of Visits 18   Date for PT Re-Evaluation 04/04/15   Authorization Type medicare    Authorization - Visit Number 13   Authorization - Number of Visits 18   PT Start Time 1350   PT Stop Time 1433   PT Time Calculation (min) 43 min   Equipment Utilized During Treatment Gait belt   Activity Tolerance Patient tolerated treatment well      Past Medical History  Diagnosis Date  . Hypertension   . Arthritis     hands and back    Past Surgical History  Procedure Laterality Date  . Joint replacement  2008    left hip  . Colonoscopy N/A 09/08/2012    Procedure: COLONOSCOPY;  Surgeon: Rogene Houston, MD;  [facility];  Service: Endoscopy;  Laterality: N/A;  730-moved to 830 Ann to notify pt  . Total hip arthroplasty Left Left Hip Replacement    There were no vitals filed for this visit.  Visit Diagnosis:  Abnormal gait  Tibial plateau fracture, left, closed, with delayed healing, subsequent encounter  Weakness of left leg      Subjective Assessment - 03/19/15 1351    Subjective Pt states that she is walking a lot now.  Does not use her cane in her home    Currently in Pain? No/denies                [facility] Adult PT Treatment/Exercise - 03/19/15 1434    Knee/Hip Exercises: Standing   Heel Raises 15 reps   Knee Flexion Both;10 reps   Forward Lunges Left;15 reps   Side Lunges Both;15 reps   Hip Abduction --  2 RT with t-bamd   Stairs 7" 3 RT   SLS with Vectors 10" x 3    Other Standing Knee Exercises tandem and sidestep over hurdles (6"/12") on balance beam x 2 RT; retro  And forward  Gt on  balance beam. x 2 RT    Knee/Hip Exercises: Seated   Sit to Sand 10 reps                  PT Short Term Goals - 03/05/15 1630    PT SHORT TERM GOAL #1   Title I HEP   Time 2   Period Weeks   Status Achieved   PT SHORT TERM GOAL #2   Title Pt to be I in ambulation with rolling walker for 200 ft    Time 3   Period Weeks   Status Achieved   PT SHORT TERM GOAL #3   Title Pt to begin advance HEP ( closted chai)   Time 3   Period Weeks   Status Achieved           PT Long Term Goals - 03/05/15 1630    PT LONG TERM GOAL #1   Title Pt to be ambulating with a cane   Time 4   Period Weeks   Status Achieved   PT LONG TERM GOAL #2   Title Pt strength to be at least 4+/5 to be able to go  up and down steps in a reciprocal manner.    Time 6   Period Weeks   Status On-going   PT LONG TERM GOAL #3   Title Pt to be walking in her home without an assistive device    Time 6   Period Weeks   Status Partially Met   PT LONG TERM GOAL #4   Title Pt to work out in the yard for 45 minutes without increased pain    Time 8   Period Weeks   Status On-going   PT LONG TERM GOAL #5   Title Pt to be able to SLS for 10 seconds to reduce risk of falling    Time 8   Period Weeks   Status Not Met               Plan - 03/19/15 1437    Clinical Impression Statement Pt worked mainly with balance activity with min assist needed for safety.  Pt is able to walk in her home without an assistive device at this time.     PT Next Visit Plan Continue with higher level balance activities.         Problem List There are no active problems to display for this patient.   Carol Gutierrez, PT CLT [phone] 03/19/2015, 2:40 PM  [place], [place], [address] Phone: [phone]   Fax:  [phone]  Name: Carol Gutierrez MRN: [id number] Date of Birth: 1936-04-06

## 2015-03-21 ENCOUNTER — Ambulatory Visit (HOSPITAL_COMMUNITY): Payer: Medicare Other | Admitting: Physical Therapy

## 2015-03-21 ENCOUNTER — Ambulatory Visit (HOSPITAL_COMMUNITY): Payer: Medicare Other

## 2015-03-21 ENCOUNTER — Encounter (HOSPITAL_COMMUNITY): Payer: Self-pay

## 2015-03-21 DIAGNOSIS — M25612 Stiffness of left shoulder, not elsewhere classified: Secondary | ICD-10-CM

## 2015-03-21 DIAGNOSIS — M25562 Pain in left knee: Secondary | ICD-10-CM | POA: Diagnosis not present

## 2015-03-21 DIAGNOSIS — M6289 Other specified disorders of muscle: Secondary | ICD-10-CM

## 2015-03-21 DIAGNOSIS — M629 Disorder of muscle, unspecified: Secondary | ICD-10-CM

## 2015-03-21 DIAGNOSIS — R269 Unspecified abnormalities of gait and mobility: Secondary | ICD-10-CM

## 2015-03-21 DIAGNOSIS — M6281 Muscle weakness (generalized): Secondary | ICD-10-CM | POA: Diagnosis not present

## 2015-03-21 DIAGNOSIS — S42202P Unspecified fracture of upper end of left humerus, subsequent encounter for fracture with malunion: Secondary | ICD-10-CM

## 2015-03-21 DIAGNOSIS — S82142G Displaced bicondylar fracture of left tibia, subsequent encounter for closed fracture with delayed healing: Secondary | ICD-10-CM | POA: Diagnosis not present

## 2015-03-21 DIAGNOSIS — R29898 Other symptoms and signs involving the musculoskeletal system: Secondary | ICD-10-CM

## 2015-03-21 NOTE — Therapy (Signed)
[place], [place], [address] Phone: [phone]   Fax:  [phone]  Occupational Therapy Treatment  Patient Details  Name: Carol Gutierrez MRN: [id number] Date of Birth: 09-Jul-1935 Referring Provider: Driscilla Moats  Encounter Date: 03/21/2015      OT End of Session - 03/21/15 1344    Visit Number 16   Number of Visits 20   Date for OT Re-Evaluation 03/26/15   Authorization Type 1-Medicare/Medicare A & B;  2- A[employer]   Authorization Time Period Before 20th visit   Authorization - Visit Number 32   Authorization - Number of Visits 20   OT Start Time 1300   OT Stop Time 1345   OT Time Calculation (min) 45 min   Activity Tolerance Patient tolerated treatment well   Behavior During Therapy [facility] for tasks assessed/performed      Past Medical History  Diagnosis Date  . Hypertension   . Arthritis     hands and back    Past Surgical History  Procedure Laterality Date  . Joint replacement  2008    left hip  . Colonoscopy N/A 09/08/2012    Procedure: COLONOSCOPY;  Surgeon: Rogene Houston, MD;  [facility];  Service: Endoscopy;  Laterality: N/A;  730-moved to 830 Ann to notify pt  . Total hip arthroplasty Left Left Hip Replacement    There were no vitals filed for this visit.  Visit Diagnosis:  Stiffness of shoulder joint, left  Muscle weakness of left arm  Tight fascia                    OT Treatments/Exercises (OP) - 03/21/15 1332    Exercises   Exercises Shoulder   Shoulder Exercises: Supine   Protraction PROM;5 reps;AROM;10 reps   Protraction Weight (lbs) 1   Horizontal ABduction PROM;5 reps;Strengthening;10 reps   Horizontal ABduction Weight (lbs) 1   External Rotation PROM;5 reps;Strengthening;10 reps   External Rotation Weight (lbs) 1   Internal Rotation PROM;5 reps;Strengthening;10 reps   Internal Rotation Weight (lbs) 1   Flexion PROM;5 reps;Strengthening;10  reps   Shoulder Flexion Weight (lbs) 1   ABduction PROM;5 reps;Strengthening;10 reps   Shoulder ABduction Weight (lbs) 1   Shoulder Exercises: Prone   Retraction AROM;10 reps   Flexion AROM;10 reps   Extension AROM;10 reps   Horizontal ABduction 1 AROM;10 reps   Other Prone Exercises scapular raises; 10X   Shoulder Exercises: ROM/Strengthening   UBE (Upper Arm Bike) Level 1 2' forward 2' reverse   Proximal Shoulder Strengthening, Supine 15X no rest breaks   Rhythmic Stabilization, Supine 30"   Manual Therapy   Manual Therapy Myofascial release;Muscle Energy Technique   Manual therapy comments manual therapy completed prior to therapy exercises.   Myofascial Release Myofascial release to left upper arm, trapezius, and scapularis regions to decrease pain and fascial restrictions and increase joint range of motion.    Muscle Energy Technique Muscle energy technique to left UE to decrease muscle spasm and increase ROM.                   OT Short Term Goals - 02/21/15 1332    OT SHORT TERM GOAL #1   Title Pt will be educated on and independent in HEP.    Time 3   Period Weeks   Status On-going   OT SHORT TERM GOAL #2   Title Pt will decrease pain to 3/10 in LUE.  Time 3   Period Weeks   Status On-going   OT SHORT TERM GOAL #3   Title Pt will decrease fascial restrictions from max to mod amount in LUE.    Time 3   Period Weeks   Status On-going   OT SHORT TERM GOAL #4   Title Pt will increase AROM to [facility] to increase ability to donn shirts without compensatory strategies.    Time 3   Period Weeks   Status On-going   OT SHORT TERM GOAL #5   Title Pt will increase strength to 3/5 to increase ability wash hair using BUE.    Time 3   Period Weeks   Status On-going           OT Long Term Goals - 01/26/15 1529    OT LONG TERM GOAL #1   Title Pt will return to prior level of functioning and independence in ADL and leisure tasks.    Time 6   Period Weeks    Status On-going   OT LONG TERM GOAL #2   Title Pt will decrease pain to 1/10 or less during daily tasks.    Time 6   Period Weeks   Status On-going   OT LONG TERM GOAL #3   Title Pt will decrease fascial restrictions from mod to min amounts or less in LUE.    Time 6   Period Weeks   Status On-going   OT LONG TERM GOAL #4   Title Pt will increase AROM to WNL to increase ability to reach into overhead cabinets.    Time 6   Period Weeks   Status On-going   OT LONG TERM GOAL #5   Title Pt will increase strength to 4/5 to increase ability to hold lightweight objects.    Time 6   Period Weeks   Status On-going               Plan - 03/21/15 1347    Clinical Impression Statement A: Added rythmic stabilization supine and UBE bike this session.Pt needed min VC for form and technique.    Plan P: Add rythmic stabilization seated.         Problem List There are no active problems to display for this patient.   Ailene Ravel, OTR/L,CBIS  [phone]  03/21/2015, 2:01 PM  [facility] [address] [place], [place], [address] Phone: [phone]   Fax:  [phone]  Name: Carol Gutierrez MRN: [id number] Date of Birth: 10/21/1935

## 2015-03-21 NOTE — Therapy (Signed)
[place], [place], [address] Phone: [phone]   Fax:  [phone]  Physical Therapy Treatment  Patient Details  Name: Carol Gutierrez MRN: [id number] Date of Birth: Jul 31, 1935 No Data Recorded  Encounter Date: 03/21/2015      PT End of Session - 03/21/15 1447    Visit Number 14   Number of Visits 18   Date for PT Re-Evaluation 04/04/15   Authorization Type medicare    Authorization - Visit Number 14   Authorization - Number of Visits 18   PT Start Time 6384   PT Stop Time 1434   PT Time Calculation (min) 46 min   Equipment Utilized During Treatment Gait belt   Activity Tolerance Patient tolerated treatment well   Behavior During Therapy [facility] for tasks assessed/performed      Past Medical History  Diagnosis Date  . Hypertension   . Arthritis     hands and back    Past Surgical History  Procedure Laterality Date  . Joint replacement  2008    left hip  . Colonoscopy N/A 09/08/2012    Procedure: COLONOSCOPY;  Surgeon: Rogene Houston, MD;  [facility];  Service: Endoscopy;  Laterality: N/A;  730-moved to 830 Ann to notify pt  . Total hip arthroplasty Left Left Hip Replacement    There were no vitals filed for this visit.  Visit Diagnosis:  Tight fascia  Abnormal gait  Tibial plateau fracture, left, closed, with delayed healing, subsequent encounter  Weakness of left leg  Proximal humerus fracture, left, with malunion, subsequent encounter  Pain in joint involving left lower leg      Subjective Assessment - 03/21/15 1455    Subjective PT states she is completing her exercises and walking alot.  No pain reported.    Currently in Pain? No/denies                         [facility] Adult PT Treatment/Exercise - 03/21/15 1352    Knee/Hip Exercises: Standing   Heel Raises 20 reps   Heel Raises Limitations toeraises 20 reps   Hip Flexion Left;10 reps   Hip Flexion  Limitations tapping on 10" step and down   Hip Abduction Both;15 reps   Hip Extension Both;15 reps   Stairs 7" 4 RT   SLS with Vectors 5" x 5 bilateral   Gait Training 226 feet without AD   Other Standing Knee Exercises tandem and sidestep over hurdles (6"/12") on balance beam x 2 RT; retro x 2 RT                   PT Short Term Goals - 03/05/15 1630    PT SHORT TERM GOAL #1   Title I HEP   Time 2   Period Weeks   Status Achieved   PT SHORT TERM GOAL #2   Title Pt to be I in ambulation with rolling walker for 200 ft    Time 3   Period Weeks   Status Achieved   PT SHORT TERM GOAL #3   Title Pt to begin advance HEP ( closted chai)   Time 3   Period Weeks   Status Achieved           PT Long Term Goals - 03/05/15 1630    PT LONG TERM GOAL #1   Title Pt to be ambulating with a cane   Time  4   Period Weeks   Status Achieved   PT LONG TERM GOAL #2   Title Pt strength to be at least 4+/5 to be able to go up and down steps in a reciprocal manner.    Time 6   Period Weeks   Status On-going   PT LONG TERM GOAL #3   Title Pt to be walking in her home without an assistive device    Time 6   Period Weeks   Status Partially Met   PT LONG TERM GOAL #4   Title Pt to work out in the yard for 45 minutes without increased pain    Time 8   Period Weeks   Status On-going   PT LONG TERM GOAL #5   Title Pt to be able to SLS for 10 seconds to reduce risk of falling    Time 8   Period Weeks   Status Not Met               Plan - 03/21/15 1447    Clinical Impression Statement Continued with functional LE stregnthening and balance focus today.   Pt tends to push onto Rt toe and use hip to advance Lt LE up onto step when negotiating stairs.  Requires verbal cues to correct.  Lt hip flexor continues to be weak with decreased mobilty.  Focused on larger steps and utilizing hip flexor with gait.  Unable to complete hurdle actvitiy without 1 HHA.     PT Next Visit Plan  Continue with higher level balance activities and progress toward goals.         Problem List There are no active problems to display for this patient.   Teena Irani, PTA/CLT [phone]  03/21/2015, 2:56 PM  [facility] [address] [place], [place], [address] Phone: [phone]   Fax:  [phone]  Name: MARILY KONCZAL MRN: [id number] Date of Birth: 28-Mar-1936

## 2015-03-26 ENCOUNTER — Ambulatory Visit (HOSPITAL_COMMUNITY): Payer: Medicare Other | Admitting: Physical Therapy

## 2015-03-26 ENCOUNTER — Ambulatory Visit (HOSPITAL_COMMUNITY): Payer: Medicare Other

## 2015-03-26 DIAGNOSIS — M629 Disorder of muscle, unspecified: Secondary | ICD-10-CM

## 2015-03-26 DIAGNOSIS — M25612 Stiffness of left shoulder, not elsewhere classified: Secondary | ICD-10-CM

## 2015-03-26 DIAGNOSIS — S82142G Displaced bicondylar fracture of left tibia, subsequent encounter for closed fracture with delayed healing: Secondary | ICD-10-CM | POA: Diagnosis not present

## 2015-03-26 DIAGNOSIS — M25562 Pain in left knee: Secondary | ICD-10-CM | POA: Diagnosis not present

## 2015-03-26 DIAGNOSIS — R269 Unspecified abnormalities of gait and mobility: Secondary | ICD-10-CM

## 2015-03-26 DIAGNOSIS — R29898 Other symptoms and signs involving the musculoskeletal system: Secondary | ICD-10-CM | POA: Diagnosis not present

## 2015-03-26 DIAGNOSIS — M6281 Muscle weakness (generalized): Secondary | ICD-10-CM | POA: Diagnosis not present

## 2015-03-26 DIAGNOSIS — M6289 Other specified disorders of muscle: Secondary | ICD-10-CM

## 2015-03-26 NOTE — Therapy (Signed)
[place], [place], [address] Phone: [phone]   Fax:  [phone]  Occupational Therapy Treatment and Reassessment  Patient Details  Name: Carol Gutierrez MRN: [id number] Date of Birth: [birth year] Referring Provider: Driscilla Moats  Encounter Date: 03/26/2015      OT End of Session - 03/26/15 1356    Visit Number 17   Number of Visits 36   Date for OT Re-Evaluation 05/25/15  mini reassess: 04/23/15   Authorization Type 1-Medicare/Medicare A & B;  2- AARP-UHC   Authorization Time Period Before 27th visit   Authorization - Visit Number 17   Authorization - Number of Visits 27   OT Start Time 8182   OT Stop Time 1345   OT Time Calculation (min) 33 min   Activity Tolerance Patient tolerated treatment well   Behavior During Therapy [facility] for tasks assessed/performed      Past Medical History  Diagnosis Date  . Hypertension   . Arthritis     hands and back    Past Surgical History  Procedure Laterality Date  . Joint replacement  2008    left hip  . Colonoscopy N/A 09/08/2012    Procedure: COLONOSCOPY;  Surgeon: Rogene Houston, MD;  [facility];  Service: Endoscopy;  Laterality: N/A;  730-moved to 830 Ann to notify pt  . Total hip arthroplasty Left Left Hip Replacement    There were no vitals filed for this visit.  Visit Diagnosis:  Tight fascia - Plan: Ot plan of care cert/re-cert  Stiffness of shoulder joint, left - Plan: Ot plan of care cert/re-cert  Muscle weakness of left arm - Plan: Ot plan of care cert/re-cert      Subjective Assessment - 03/26/15 1335    Subjective  S: I'm completing my exercises 15X at home.   Special Tests FOTO score: 54/100   Currently in Pain? No/denies            [facility] OT Assessment - 03/26/15 1336    Assessment   Diagnosis left proximal humerus fracture   Precautions   Precautions None   Precaution Comments Progress as tolerated. Begin  strengthening as able. Modalities PRN   AROM   Overall AROM Comments Assessed in supine, er/IR adducted   AROM Assessment Site Shoulder   Right/Left Shoulder Left   Left Shoulder Flexion 133 Degrees  previous: 133   Left Shoulder ABduction 113 Degrees  previous: 87   Left Shoulder Internal Rotation 90 Degrees  previous: 90   Left Shoulder External Rotation 40 Degrees  previous: 37   PROM   Overall PROM Comments Assessed in supine, er/IR adducted   PROM Assessment Site Shoulder   Right/Left Shoulder Left   Left Shoulder Flexion 155 Degrees  previous: 155   Left Shoulder ABduction 180 Degrees  previous: 110   Left Shoulder Internal Rotation 90 Degrees  previous: 90   Left Shoulder External Rotation 75 Degrees  previous: 52   Strength   Overall Strength Comments Assessed seated. IR/er adducted   Strength Assessment Site Shoulder   Right/Left Shoulder Left   Left Shoulder Flexion 3-/5  previous: 3-/5   Left Shoulder ABduction 3-/5  previous: 3-/5   Left Shoulder Internal Rotation 3/5  previous: 3/5   Left Shoulder External Rotation 3+/5  previous: 3+/5                  OT Treatments/Exercises (OP) - 03/26/15 1353    Exercises  Exercises Shoulder   Shoulder Exercises: Supine   Protraction PROM;5 reps;Strengthening;15 reps   Protraction Weight (lbs) 1   Horizontal ABduction PROM;5 reps   External Rotation PROM;5 reps   Internal Rotation PROM;5 reps   Flexion PROM;5 reps;Strengthening;15 reps   Shoulder Flexion Weight (lbs) 1   ABduction PROM;5 reps   Manual Therapy   Manual Therapy Myofascial release;Muscle Energy Technique   Manual therapy comments manual therapy completed prior to therapy exercises.   Myofascial Release Myofascial release to left upper arm, trapezius, and scapularis regions to decrease pain and fascial restrictions and increase joint range of motion.    Muscle Energy Technique Muscle energy technique to left UE to decrease muscle spasm  and increase ROM.                   OT Short Term Goals - 03/26/15 1355    OT SHORT TERM GOAL #1   Title Pt will be educated on and independent in HEP.    Time 3   Period Weeks   Status Achieved   OT SHORT TERM GOAL #2   Title Pt will decrease pain to 3/10 in LUE.    Time 3   Period Weeks   Status Achieved   OT SHORT TERM GOAL #3   Title Pt will decrease fascial restrictions from max to mod amount in LUE.    Time 3   Period Weeks   Status Achieved   OT SHORT TERM GOAL #4   Title Pt will increase AROM to [facility] to increase ability to donn shirts without compensatory strategies.    Time 3   Period Weeks   Status On-going   OT SHORT TERM GOAL #5   Title Pt will increase strength to 3/5 to increase ability wash hair using BUE.    Time 3   Period Weeks   Status On-going           OT Long Term Goals - 01/26/15 1529    OT LONG TERM GOAL #1   Title Pt will return to prior level of functioning and independence in ADL and leisure tasks.    Time 6   Period Weeks   Status On-going   OT LONG TERM GOAL #2   Title Pt will decrease pain to 1/10 or less during daily tasks.    Time 6   Period Weeks   Status On-going   OT LONG TERM GOAL #3   Title Pt will decrease fascial restrictions from mod to min amounts or less in LUE.    Time 6   Period Weeks   Status On-going   OT LONG TERM GOAL #4   Title Pt will increase AROM to WNL to increase ability to reach into overhead cabinets.    Time 6   Period Weeks   Status On-going   OT LONG TERM GOAL #5   Title Pt will increase strength to 4/5 to increase ability to hold lightweight objects.    Time 6   Period Weeks   Status On-going               Plan - 03/26/15 1357    Clinical Impression Statement A: Reassessment completed this date. patient has met 3/5 STGs and is progressing towards remaining goals. patient has shown improvement with ROM and strength measurements. Pt continues to have deficits with reaching  higher than shoulder level without using compensatory techniques.  Pt continues to have deficits related to strengthening.  Plan P: Continue therapy 2X a week for 4 more weeks focusing on strength and LUE ROM. Add rythmic stabilization seated.           G-Codes - Apr 13, 2015 1400    Functional Assessment Tool Used FOTO score: 54/100 (46% impaired)   Functional Limitation Carrying, moving and handling objects   Carrying, Moving and Handling Objects Current Status (P1121) At least 40 percent but less than 60 percent impaired, limited or restricted   Carrying, Moving and Handling Objects Goal Status (K2446) At least 20 percent but less than 40 percent impaired, limited or restricted    Occupational Therapy Progress Note  Dates of Reporting Period: 02/21/15 to 2015/04/13  Objective Reports of Subjective Statement: See above  Objective Measurements: See above  Goal Update: See above  Plan: See above  Reason Skilled Services are Required: Pt requires continued OT services to increase functional performance when using LUE during daily tasks.    Problem List There are no active problems to display for this patient.   Ailene Ravel, OTR/L,CBIS  [phone]  13-Apr-2015, 2:04 PM  [facility] [address] [place], [place], [address] Phone: [phone]   Fax:  [phone]  Name: LAJUANDA PENICK MRN: [id number] Date of Birth: 12/02/1935

## 2015-03-26 NOTE — Therapy (Signed)
[facility] [address], [place], [address] Phone: [phone]   Fax:  [phone]  Physical Therapy Treatment  Patient Details  Name: Carol Gutierrez MRN: [id number] Date of Birth: 08/06/1935 No Data Recorded  Encounter Date: 03/26/2015      PT End of Session - 03/26/15 1457    Visit Number 15   Number of Visits 18   Date for PT Re-Evaluation 04/04/15   Authorization - Visit Number 15   Authorization - Number of Visits 18   PT Start Time 1350   PT Stop Time 1430   PT Time Calculation (min) 40 min   Equipment Utilized During Treatment Gait belt   Activity Tolerance Patient tolerated treatment well   Behavior During Therapy [facility] for tasks assessed/performed      Past Medical History  Diagnosis Date  . Hypertension   . Arthritis     hands and back    Past Surgical History  Procedure Laterality Date  . Joint replacement  2008    left hip  . Colonoscopy N/A 09/08/2012    Procedure: COLONOSCOPY;  Surgeon: Najeeb U Rehman, MD;  [facility];  Service: Endoscopy;  Laterality: N/A;  730-moved to 830 Ann to notify pt  . Total hip arthroplasty Left Left Hip Replacement    There were no vitals filed for this visit.  Visit Diagnosis:  Abnormal gait  Tibial plateau fracture, left, closed, with delayed healing, subsequent encounter  Weakness of left leg  Pain in joint involving left lower leg      Subjective Assessment - 03/26/15 1350    Subjective Pt states she was walking with a cane in the dark without difficulty    Currently in Pain? No/denies               [facility] Adult PT Treatment/Exercise - 03/26/15 1455    Knee/Hip Exercises: Machines for Strengthening   Hip Cybex Lt LE only 3.5 plate x 10 rep.   Knee/Hip Exercises: Standing   Stairs 7" x 3 RT    Other Standing Knee Exercises fire hydrant as well as hip extrension with knee bent x 10 reps each              Balance Exercises -  03/26/15 1453    Balance Exercises: Standing   SLS --  Tree pose adjusted due to lack of ROM in Lt shoulder 2 x 30"   Balance Beam retro,forward and side step x 2 RT; forward and side step with hurdles x 2 RT              PT Short Term Goals - 03/05/15 1630    PT SHORT TERM GOAL #1   Title I HEP   Time 2   Period Weeks   Status Achieved   PT SHORT TERM GOAL #2   Title Pt to be I in ambulation with rolling walker for 200 ft    Time 3   Period Weeks   Status Achieved   PT SHORT TERM GOAL #3   Title Pt to begin advance HEP ( closted chai)   Time 3   Period Weeks   Status Achieved           PT Long Term Goals - 03/05/15 1630    PT LONG TERM GOAL #1   Title Pt to be ambulating with a cane   Time 4   Period Weeks   Status Achieved   PT   LONG TERM GOAL #2   Title Pt strength to be at least 4+/5 to be able to go up and down steps in a reciprocal manner.    Time 6   Period Weeks   Status On-going   PT LONG TERM GOAL #3   Title Pt to be walking in her home without an assistive device    Time 6   Period Weeks   Status Partially Met   PT LONG TERM GOAL #4   Title Pt to work out in the yard for 45 minutes without increased pain    Time 8   Period Weeks   Status On-going   PT LONG TERM GOAL #5   Title Pt to be able to SLS for 10 seconds to reduce risk of falling    Time 8   Period Weeks   Status Not Met               Plan - 03/26/15 1457    Clinical Impression Statement Pt improving with balance activities with min guard assist needed on balance beam from therapist.  Pt hip flexor is not only weak by tight as well.  Pt will need continues skiled care to progress pt to her prior state of ambulating without an assisitve device.    PT Next Visit Plan Continue with higher level balance activities and progress toward goals.    PT Home Exercise Plan given         Problem List There are no active problems to display for this patient. Cynthia Russell, PT  CLT [phone] 03/26/2015, 3:00 PM  [facility] [address] [place], [place], [address] Phone: [phone]   Fax:  [phone]  Name: Carol Gutierrez MRN: [id number] Date of Birth: 03/25/1936     

## 2015-03-28 ENCOUNTER — Ambulatory Visit (HOSPITAL_COMMUNITY): Payer: Medicare Other | Admitting: Physical Therapy

## 2015-03-28 ENCOUNTER — Ambulatory Visit (HOSPITAL_COMMUNITY): Payer: Medicare Other | Admitting: Specialist

## 2015-03-28 DIAGNOSIS — M6281 Muscle weakness (generalized): Secondary | ICD-10-CM

## 2015-03-28 DIAGNOSIS — S82142G Displaced bicondylar fracture of left tibia, subsequent encounter for closed fracture with delayed healing: Secondary | ICD-10-CM

## 2015-03-28 DIAGNOSIS — M25512 Pain in left shoulder: Secondary | ICD-10-CM

## 2015-03-28 DIAGNOSIS — M6289 Other specified disorders of muscle: Secondary | ICD-10-CM

## 2015-03-28 DIAGNOSIS — M25562 Pain in left knee: Secondary | ICD-10-CM | POA: Diagnosis not present

## 2015-03-28 DIAGNOSIS — M25612 Stiffness of left shoulder, not elsewhere classified: Secondary | ICD-10-CM | POA: Diagnosis not present

## 2015-03-28 DIAGNOSIS — R29898 Other symptoms and signs involving the musculoskeletal system: Secondary | ICD-10-CM | POA: Diagnosis not present

## 2015-03-28 DIAGNOSIS — M629 Disorder of muscle, unspecified: Secondary | ICD-10-CM

## 2015-03-28 NOTE — Therapy (Signed)
[place], [place], [address] Phone: [phone]   Fax:  [phone]  Occupational Therapy Treatment  Patient Details  Name: Carol Gutierrez MRN: [id number] Date of Birth: 03/18/1936 Referring Provider: Driscilla Moats  Encounter Date: 03/28/2015      OT End of Session - 03/28/15 1354    Visit Number 18   Number of Visits 36   Date for OT Re-Evaluation 05/25/15  mini reassess on 04/23/15   Authorization Type 1-Medicare/Medicare A & B;  2- [employer]   Authorization Time Period Before 27th visit   Authorization - Visit Number 18   Authorization - Number of Visits 27   OT Start Time 1312   OT Stop Time 1350   OT Time Calculation (min) 38 min   Activity Tolerance Patient tolerated treatment well   Behavior During Therapy [facility] for tasks assessed/performed      Past Medical History  Diagnosis Date  . Hypertension   . Arthritis     hands and back    Past Surgical History  Procedure Laterality Date  . Joint replacement  2008    left hip  . Colonoscopy N/A 09/08/2012    Procedure: COLONOSCOPY;  Surgeon: Rogene Houston, MD;  [facility];  Service: Endoscopy;  Laterality: N/A;  730-moved to 830 Ann to notify pt  . Total hip arthroplasty Left Left Hip Replacement    There were no vitals filed for this visit.  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 Yes   Pain Score 2    Pain Location Shoulder   Pain Orientation Left   Pain Descriptors / Indicators Aching            [facility] OT Assessment - 03/28/15 0001    Assessment   Diagnosis left proximal humerus fracture   Precautions   Precautions None   Precaution Comments Progress as tolerated. Begin  strengthening as able. Modalities PRN                  OT Treatments/Exercises (OP) - 03/28/15 0001    Exercises   Exercises Shoulder   Shoulder Exercises: Supine   Protraction PROM;5 reps;Strengthening;12 reps   Protraction Weight (lbs) 1   Horizontal ABduction PROM;5 reps;Strengthening;12 reps   Horizontal ABduction Weight (lbs) 1   External Rotation PROM;5 reps;Strengthening;12 reps   External Rotation Weight (lbs) 1   Internal Rotation PROM;5 reps;Strengthening;12 reps   Internal Rotation Weight (lbs) 1   Flexion PROM;5 reps;Strengthening;12 reps   Shoulder Flexion Weight (lbs) 1   ABduction PROM;5 reps;Strengthening;12 reps   Shoulder ABduction Weight (lbs) 1   Shoulder Exercises: Sidelying   External Rotation AROM;10 reps   Internal Rotation AROM;10 reps   Flexion AROM;10 reps   Flexion Limitations with facilitation at end range   ABduction AROM;10 reps   ABduction Limitations with facilitation at end range    Shoulder Exercises: ROM/Strengthening   Wall Wash 2'   "W" Arms with arms to side 12X   X to V Arms 12 times through available range   Rhythmic Stabilization, Seated 10" at 45, 90, 120   Other ROM/Strengthening Exercises  pinch tree - able to reach to 13" from top with max difficulty   Manual Therapy   Manual Therapy Myofascial release   Manual therapy comments manual therapy completed prior to therapy exercises.   Myofascial Release Myofascial release to left upper arm, trapezius, and scapularis regions to decrease pain and fascial restrictions and increase joint range of motion.                   OT Short Term Goals - 03/26/15 1355    OT SHORT TERM GOAL #1   Title Pt will be educated on and independent in HEP.    Time 3   Period Weeks   Status Achieved   OT SHORT TERM GOAL #2   Title Pt will decrease pain to 3/10 in LUE.    Time 3   Period Weeks   Status Achieved   OT SHORT TERM GOAL #3   Title Pt will decrease fascial restrictions  from max to mod amount in LUE.    Time 3   Period Weeks   Status Achieved   OT SHORT TERM GOAL #4   Title Pt will increase AROM to [facility] to increase ability to donn shirts without compensatory strategies.    Time 3   Period Weeks   Status On-going   OT SHORT TERM GOAL #5   Title Pt will increase strength to 3/5 to increase ability wash hair using BUE.    Time 3   Period Weeks   Status On-going           OT Long Term Goals - 01/26/15 1529    OT LONG TERM GOAL #1   Title Pt will return to prior level of functioning and independence in ADL and leisure tasks.    Time 6   Period Weeks   Status On-going   OT LONG TERM GOAL #2   Title Pt will decrease pain to 1/10 or less during daily tasks.    Time 6   Period Weeks   Status On-going   OT LONG TERM GOAL #3   Title Pt will decrease fascial restrictions from mod to min amounts or less in LUE.    Time 6   Period Weeks   Status On-going   OT LONG TERM GOAL #4   Title Pt will increase AROM to WNL to increase ability to reach into overhead cabinets.    Time 6   Period Weeks   Status On-going   OT LONG TERM GOAL #5   Title Pt will increase strength to 4/5 to increase ability to hold lightweight objects.    Time 6   Period Weeks   Status On-going               Plan - 03/28/15 1355    Clinical Impression Statement A:  Added scapular stability exercises and sidelying for greater ability to reach end rage with therapist facilitation.   Plan P:  focus on scapular stability to improve ability to complete funcitonal reaching activities in available range.    Consulted and Agree with Plan of Care Patient        Problem List There are no active problems to display for this patient.   Vangie Bicker, OTR/L [phone]  03/28/2015, 1:57 PM  [place] [address] [place], [place], [address] Phone: [phone]   Fax:  [phone]  Name: Carol Gutierrez MRN:  [id number] Date of Birth: Jan 05, 1936

## 2015-03-28 NOTE — Therapy (Signed)
[place], [place], [address] Phone: [phone]   Fax:  [phone]  Physical Therapy Treatment  Patient Details  Name: Carol Gutierrez MRN: [id number] Date of Birth: [birth year] No Data Recorded  Encounter Date: 03/28/2015      PT End of Session - 03/28/15 1454    Visit Number 16   Number of Visits 18   Date for PT Re-Evaluation 04/04/15   Authorization - Visit Number 45   Authorization - Number of Visits 18   PT Start Time 1350   PT Stop Time 1432   PT Time Calculation (min) 42 min   Equipment Utilized During Treatment Gait belt   Activity Tolerance Patient tolerated treatment well   Behavior During Therapy [facility] for tasks assessed/performed      Past Medical History  Diagnosis Date  . Hypertension   . Arthritis     hands and back    Past Surgical History  Procedure Laterality Date  . Joint replacement  2008    left hip  . Colonoscopy N/A 09/08/2012    Procedure: COLONOSCOPY;  Surgeon: Rogene Houston, MD;  [facility];  Service: Endoscopy;  Laterality: N/A;  730-moved to 830 Ann to notify pt  . Total hip arthroplasty Left Left Hip Replacement    There were no vitals filed for this visit.  Visit Diagnosis:  Tibial plateau fracture, left, closed, with delayed healing, subsequent encounter  Weakness of left leg  Pain in joint involving left lower leg  Tight fascia      Subjective Assessment - 03/28/15 1416    Subjective Pt states she's been going up and down her stairs at home and it has been helping.  Reports pain in her Lt knee today of 5/10   Currently in Pain? Yes   Pain Score 5    Pain Location Knee   Pain Orientation Left                         [facility] Adult PT Treatment/Exercise - 03/28/15 1419    Knee/Hip Exercises: Standing   Hip Abduction Both;15 reps   Hip Extension Both;15 reps   Other Standing Knee Exercises hip flexion to parallel bar 10 reps    Other Standing Knee Exercises high marching 1RT             Balance Exercises - 03/28/15 1505    Balance Exercises: Standing   SLS --  Tree pose adjusted due to lack of ROM in Lt shoulder 2 x 30"   Balance Beam retro,forward and side step x 2 RT; forward and side step with hurdles x 2 RT              PT Short Term Goals - 03/05/15 1630    PT SHORT TERM GOAL #1   Title I HEP   Time 2   Period Weeks   Status Achieved   PT SHORT TERM GOAL #2   Title Pt to be I in ambulation with rolling walker for 200 ft    Time 3   Period Weeks   Status Achieved   PT SHORT TERM GOAL #3   Title Pt to begin advance HEP ( closted chai)   Time 3   Period Weeks   Status Achieved           PT Long Term Goals - 03/05/15 1630    PT LONG TERM GOAL #1  Title Pt to be ambulating with a cane   Time 4   Period Weeks   Status Achieved   PT LONG TERM GOAL #2   Title Pt strength to be at least 4+/5 to be able to go up and down steps in a reciprocal manner.    Time 6   Period Weeks   Status On-going   PT LONG TERM GOAL #3   Title Pt to be walking in her home without an assistive device    Time 6   Period Weeks   Status Partially Met   PT LONG TERM GOAL #4   Title Pt to work out in the yard for 45 minutes without increased pain    Time 8   Period Weeks   Status On-going   PT LONG TERM GOAL #5   Title Pt to be able to SLS for 10 seconds to reduce risk of falling    Time 8   Period Weeks   Status Not Met               Plan - 03/28/15 1455    Clinical Impression Statement Continued improvement with balance actvities requireing only min guard assit and 1 LOB only with hurdles over balance beam.  Added high marching and lifting knees to parallel bar to help increase hip flexor strength.     PT Next Visit Plan Continue with higher level balance activities and progress toward goals. Re-evaluate in 2 more sessions.    PT Home Exercise Plan given         Problem  List There are no active problems to display for this patient.   Teena Irani, PTA/CLT [phone] 03/28/2015, 3:05 PM  [place] [address] [place], [place], [address] Phone: [phone]   Fax:  [phone]  Name: Carol Gutierrez MRN: [id number] Date of Birth: 06/23/1935

## 2015-04-02 ENCOUNTER — Ambulatory Visit (HOSPITAL_COMMUNITY): Payer: Medicare Other

## 2015-04-03 ENCOUNTER — Ambulatory Visit (HOSPITAL_COMMUNITY): Payer: Medicare Other

## 2015-04-03 ENCOUNTER — Encounter (HOSPITAL_COMMUNITY): Payer: Self-pay

## 2015-04-03 DIAGNOSIS — M629 Disorder of muscle, unspecified: Secondary | ICD-10-CM | POA: Diagnosis not present

## 2015-04-03 DIAGNOSIS — M25562 Pain in left knee: Secondary | ICD-10-CM | POA: Diagnosis not present

## 2015-04-03 DIAGNOSIS — R29898 Other symptoms and signs involving the musculoskeletal system: Secondary | ICD-10-CM | POA: Diagnosis not present

## 2015-04-03 DIAGNOSIS — M6281 Muscle weakness (generalized): Secondary | ICD-10-CM | POA: Diagnosis not present

## 2015-04-03 DIAGNOSIS — M25612 Stiffness of left shoulder, not elsewhere classified: Secondary | ICD-10-CM | POA: Diagnosis not present

## 2015-04-03 DIAGNOSIS — M25512 Pain in left shoulder: Secondary | ICD-10-CM

## 2015-04-03 DIAGNOSIS — S82142G Displaced bicondylar fracture of left tibia, subsequent encounter for closed fracture with delayed healing: Secondary | ICD-10-CM | POA: Diagnosis not present

## 2015-04-03 DIAGNOSIS — M6289 Other specified disorders of muscle: Secondary | ICD-10-CM

## 2015-04-03 NOTE — Therapy (Signed)
[place], [place], [address] Phone: [phone]   Fax:  [phone]  Occupational Therapy Treatment  Patient Details  Name: Carol Gutierrez MRN: [id number] Date of Birth: 1936-04-25 Referring Provider: Driscilla Moats  Encounter Date: 04/03/2015      OT End of Session - 04/03/15 1204    Visit Number 19   Number of Visits 36   Date for OT Re-Evaluation 05/25/15  mini reassess on 04/23/15   Authorization Type 1-Medicare/Medicare A & B;  2- [employer]   Authorization Time Period Before 27th visit   Authorization - Visit Number 19   Authorization - Number of Visits 27   OT Start Time 1105   OT Stop Time 1150   OT Time Calculation (min) 45 min   Activity Tolerance Patient tolerated treatment well   Behavior During Therapy [facility] for tasks assessed/performed      Past Medical History  Diagnosis Date  . Hypertension   . Arthritis     hands and back    Past Surgical History  Procedure Laterality Date  . Joint replacement  2008    left hip  . Colonoscopy N/A 09/08/2012    Procedure: COLONOSCOPY;  Surgeon: Rogene Houston, MD;  [facility];  Service: Endoscopy;  Laterality: N/A;  730-moved to 830 Ann to notify pt  . Total hip arthroplasty Left Left Hip Replacement    There were no vitals filed for this visit.  Visit Diagnosis:  Tight fascia  Stiffness of shoulder joint, left  Muscle weakness of left arm  Pain in left shoulder      Subjective Assessment - 04/03/15 1125    Subjective  S: I use a 1lb box of brown suger to do my exercises at home.    Currently in Pain? Yes   Pain Score 3    Pain Location Shoulder   Pain Orientation Left   Pain Descriptors / Indicators Sore   Pain Type Acute pain            [facility] OT Assessment - 04/03/15 1127    Assessment   Diagnosis left proximal humerus fracture   Precautions   Precautions None   Precaution Comments Progress as tolerated. Begin  strengthening as able. Modalities PRN                  OT Treatments/Exercises (OP) - 04/03/15 1127    Exercises   Exercises Shoulder   Shoulder Exercises: Supine   Protraction PROM;5 reps;Strengthening;12 reps   Protraction Weight (lbs) 1   Horizontal ABduction PROM;5 reps;Strengthening;12 reps   Horizontal ABduction Weight (lbs) 1   External Rotation PROM;5 reps;Strengthening;12 reps   External Rotation Weight (lbs) 1   Internal Rotation PROM;5 reps;Strengthening;12 reps   Internal Rotation Weight (lbs) 1   Flexion PROM;5 reps;Strengthening;12 reps   Shoulder Flexion Weight (lbs) 1   ABduction PROM;5 reps;Strengthening;12 reps   Shoulder ABduction Weight (lbs) 1   Shoulder Exercises: Sidelying   External Rotation AROM;10 reps   Internal Rotation AROM;10 reps   Flexion AROM;10 reps   Flexion Limitations VC for form and technique   ABduction AROM;10 reps   ABduction Limitations VC for form and technique   Shoulder Exercises: Stretch   Wall Stretch - Flexion 3 reps;10 seconds   Manual Therapy   Manual Therapy Myofascial release;Muscle Energy Technique   Manual therapy comments manual therapy completed prior to therapy exercises.   Myofascial Release Myofascial release to left upper  arm, trapezius, and scapularis regions to decrease pain and fascial restrictions and increase joint range of motion.    Muscle Energy Technique Muscle energy technique to left UE to decrease muscle spasm and increase ROM.                   OT Short Term Goals - 04/03/15 1210    OT SHORT TERM GOAL #1   Title Pt will be educated on and independent in HEP.    Time 3   Period Weeks   OT SHORT TERM GOAL #2   Title Pt will decrease pain to 3/10 in LUE.    Time 3   Period Weeks   OT SHORT TERM GOAL #3   Title Pt will decrease fascial restrictions from Carol to mod amount in LUE.    Time 3   Period Weeks   OT SHORT TERM GOAL #4   Title Pt will increase AROM to [facility] to increase  ability to donn shirts without compensatory strategies.    Time 3   Period Weeks   Status On-going   OT SHORT TERM GOAL #5   Title Pt will increase strength to 3/5 to increase ability wash hair using BUE.    Time 3   Period Weeks   Status On-going           OT Long Term Goals - 01/26/15 1529    OT LONG TERM GOAL #1   Title Pt will return to prior level of functioning and independence in ADL and leisure tasks.    Time 6   Period Weeks   Status On-going   OT LONG TERM GOAL #2   Title Pt will decrease pain to 1/10 or less during daily tasks.    Time 6   Period Weeks   Status On-going   OT LONG TERM GOAL #3   Title Pt will decrease fascial restrictions from mod to min amounts or less in LUE.    Time 6   Period Weeks   Status On-going   OT LONG TERM GOAL #4   Title Pt will increase AROM to WNL to increase ability to reach into overhead cabinets.    Time 6   Period Weeks   Status On-going   OT LONG TERM GOAL #5   Title Pt will increase strength to 4/5 to increase ability to hold lightweight objects.    Time 6   Period Weeks   Status On-going               Plan - 04/03/15 1205    Clinical Impression Statement A: Continued with scapular stability exercises providing min VC for form and technique.   Plan P; review shoulder stretches that are on HEP and provide cues on form and technique if needed.         Problem List There are no active problems to display for this patient.   Ailene Ravel, OTR/L,CBIS  [phone]  04/03/2015, 12:11 PM  [place] [address] [place], [place], [address] Phone: [phone]   Fax:  [phone]  Name: Carol Gutierrez MRN: [id number] Date of Birth: April 05, 1936

## 2015-04-04 ENCOUNTER — Ambulatory Visit (HOSPITAL_COMMUNITY): Payer: Medicare Other | Admitting: Physical Therapy

## 2015-04-04 DIAGNOSIS — M6281 Muscle weakness (generalized): Secondary | ICD-10-CM | POA: Diagnosis not present

## 2015-04-04 DIAGNOSIS — R269 Unspecified abnormalities of gait and mobility: Secondary | ICD-10-CM

## 2015-04-04 DIAGNOSIS — M25612 Stiffness of left shoulder, not elsewhere classified: Secondary | ICD-10-CM | POA: Diagnosis not present

## 2015-04-04 DIAGNOSIS — M629 Disorder of muscle, unspecified: Secondary | ICD-10-CM | POA: Diagnosis not present

## 2015-04-04 DIAGNOSIS — R29898 Other symptoms and signs involving the musculoskeletal system: Secondary | ICD-10-CM | POA: Diagnosis not present

## 2015-04-04 DIAGNOSIS — M25562 Pain in left knee: Secondary | ICD-10-CM | POA: Diagnosis not present

## 2015-04-04 DIAGNOSIS — S82142G Displaced bicondylar fracture of left tibia, subsequent encounter for closed fracture with delayed healing: Secondary | ICD-10-CM | POA: Diagnosis not present

## 2015-04-04 NOTE — Therapy (Signed)
[place], [place], [address] Phone: [phone]   Fax:  [phone]  Physical Therapy Treatment  Patient Details  Name: Carol Gutierrez MRN: [id number] Date of Birth: 1936-03-14 No Data Recorded  Encounter Date: 04/04/2015      PT End of Session - 04/04/15 0939    Visit Number 17   Number of Visits 29   Date for PT Re-Evaluation 05/04/15   Authorization Type medicare    Authorization Time Period Authorized 01/25/2015-03/27/2015   Authorization - Visit Number 30   Authorization - Number of Visits 20  will need foto for gcode    PT Start Time 0848   PT Stop Time 0930   PT Time Calculation (min) 42 min   Activity Tolerance Patient tolerated treatment well   Behavior During Therapy [facility] for tasks assessed/performed      Past Medical History  Diagnosis Date  . Hypertension   . Arthritis     hands and back    Past Surgical History  Procedure Laterality Date  . Joint replacement  2008    left hip  . Colonoscopy N/A 09/08/2012    Procedure: COLONOSCOPY;  Surgeon: Rogene Houston, MD;  [facility];  Service: Endoscopy;  Laterality: N/A;  730-moved to 830 Ann to notify pt  . Total hip arthroplasty Left Left Hip Replacement    There were no vitals filed for this visit.  Visit Diagnosis:  Tibial plateau fracture, left, closed, with delayed healing, subsequent encounter  Weakness of left leg  Pain in joint involving left lower leg  Abnormal gait      Subjective Assessment - 04/04/15 0932    Subjective Pt states if she is just walking the pain is at a 2; going up and down steps pain can increase to a 5.    Pertinent History CVA 12/10/2014; HTN , osteroporosis,    How long can you sit comfortably? no problem;   How long can you stand comfortably? 20 minutes was 5-10    How long can you walk comfortably? walking with a straight cane now for 30 minutes was with quad cane 10 minutes.    Patient  Stated Goals to be able to walk again    Currently in Pain? Yes   Pain Score 2    Pain Location Knee   Pain Orientation Left   Pain Type Chronic pain            [facility] PT Assessment - 04/04/15 0853    Precautions   Precautions None   Precaution Comments Progress as tolerated. Begin strengthening as able. Modalities PRN   Functional Tests   Functional tests Single leg stance;Sit to Stand   Single Leg Stance   Comments Rt:  was 8 now 15; Lt was 0 now 26 seconds    Sit to Stand   Comments no UE 5 sit to stand in 13.05 was 24 seconds.    ROM / Strength   AROM / PROM / Strength AROM   AROM   Left Knee Extension 10  was 8 pt as stopped doing her quad sets PROM 6    Left Knee Flexion 120  120    Strength   Left Hip Flexion 5/5   Left Hip Extension 4-/5  was 4-/5    Left Hip ABduction 3+/5  was 3/5    Left Hip ADduction 5/5   Left Knee Flexion 4-/5  3+/5    Left  Knee Extension 5/5   Left Ankle Dorsiflexion 4/5                     [facility] Adult PT Treatment/Exercise - 04/04/15 0919    Knee/Hip Exercises: Standing   Heel Raises Left;15 reps   SLS x 5 each    Knee/Hip Exercises: Seated   Sit to Sand 10 reps  keeping weight even on both legs    Knee/Hip Exercises: Supine   Quad Sets 10 reps   Knee Extension PROM   Knee Flexion PROM   Knee/Hip Exercises: Sidelying   Hip ABduction Left;10 reps   Hip ABduction Limitations 4#   Other Sidelying Knee/Hip Exercises hip circles forward and backward x 10 rep Lt LE   with 4#   Knee/Hip Exercises: Prone   Hip Extension Strengthening;Both;10 reps                PT Education - 04/04/15 0930    Education provided Yes   Education Details The importance of keeping weight even on both LE when doing standing activity.  The importance of keeping up her quad sets.  Prone knee hang    Person(s) Educated Patient   Methods Explanation;Tactile cues;Verbal cues   Comprehension Verbalized understanding           PT Short Term Goals - 04/04/15 0934    PT SHORT TERM GOAL #1   Title I HEP   Period Weeks   Status Achieved   PT SHORT TERM GOAL #2   Title Pt to be I in ambulation with rolling walker for 200 ft    Time 3   Period Weeks   Status Achieved   PT SHORT TERM GOAL #3   Title Pt to begin advance HEP ( closted chai)   Time 3   Period Weeks   Status Achieved           PT Long Term Goals - 04/04/15 2542    PT LONG TERM GOAL #1   Title Pt to be ambulating with a cane   Time 4   Period Weeks   Status Achieved   PT LONG TERM GOAL #2   Title Pt strength to be at least 4+/5 to be able to go up and down steps in a reciprocal manner.    Time 6   Period Weeks   Status On-going   PT LONG TERM GOAL #3   Title Pt to be walking in her home without an assistive device    Time 6   Period Weeks   Status Partially Met   PT LONG TERM GOAL #4   Title Pt to work out in the yard for 45 minutes without increased pain    Time 8   Period Weeks   Status On-going   PT LONG TERM GOAL #5   Title Pt to be able to SLS for 10 seconds to reduce risk of falling    Time 8   Period Weeks   Status Achieved               Plan - 04/04/15 0941    Clinical Impression Statement Pt reassessed today with great improvement in balance.  Pt still has decreased knee extension will add PROM and stressed to pt to continue quad sets multiple times a day at home, (pt had stopped doing these exercies).  Pt will put weight on Rt LE when completing standing activities if she is not verbally cued to keep  her weight on her Lt LE .  Pt continues to have an antalgic gait and ambulates with a cane due to weak dorsiflexors, weak gluteal maximus, minimus and hamstring mm.  Pt will benefit from continued skilled physcial therapy to address these issues and maximize her functional ability.l    Pt will benefit from skilled therapeutic intervention in order to improve on the following deficits Abnormal gait;Decreased activity  tolerance;Decreased balance;Decreased strength;Difficulty walking;Pain;Impaired perceived functional ability   PT Frequency 3x / week  Pt was 2x a week when she was nonweight bearing then increased to 3 times a week once pt was allowed to weight bear.    PT Duration --  a total of 10 weeks.    PT Treatment/Interventions ADLs/Self Care Home Management;DME Instruction;Gait training;Stair training;Functional mobility training;Therapeutic activities;Therapeutic exercise;Balance training;Neuromuscular re-education;Patient/family education;Manual techniques   PT Next Visit Plan make sure pt is equalizing weight with weight bearing activities; PRIOM for knee extension as welll as terminal standing extension with ball at wall; focus to be on strengthening gluteal medius and maximus at this time.   PT Home Exercise Plan given    Consulted and Agree with Plan of Care Patient        Problem List There are no active problems to display for this patient.   Rayetta Humphrey, PT CLT [phone] 04/04/2015, 9:50 AM  [place] [address] [place], [place], [address] Phone: [phone]   Fax:  [phone]  Name: Carol Gutierrez MRN: [id number] Date of Birth: [birth year]   Physical Therapy Progress Note  Dates of Reporting Period: 03/05/2015  to 04/04/2015  Objective Reports of Subjective Statement: see above  Objective Measurements: see above   Goal Update: see above   Plan: continue 3x a week for 4 more weeks  Reason Skilled Services are Required: see above

## 2015-04-06 ENCOUNTER — Encounter (HOSPITAL_COMMUNITY): Payer: Self-pay | Admitting: Occupational Therapy

## 2015-04-06 ENCOUNTER — Ambulatory Visit (HOSPITAL_COMMUNITY): Payer: Medicare Other | Attending: General Practice | Admitting: Occupational Therapy

## 2015-04-06 ENCOUNTER — Ambulatory Visit (HOSPITAL_COMMUNITY): Payer: Medicare Other | Admitting: Physical Therapy

## 2015-04-06 DIAGNOSIS — R29898 Other symptoms and signs involving the musculoskeletal system: Secondary | ICD-10-CM | POA: Insufficient documentation

## 2015-04-06 DIAGNOSIS — R269 Unspecified abnormalities of gait and mobility: Secondary | ICD-10-CM | POA: Insufficient documentation

## 2015-04-06 DIAGNOSIS — M25512 Pain in left shoulder: Secondary | ICD-10-CM | POA: Insufficient documentation

## 2015-04-06 DIAGNOSIS — M6281 Muscle weakness (generalized): Secondary | ICD-10-CM

## 2015-04-06 DIAGNOSIS — X58XXXD Exposure to other specified factors, subsequent encounter: Secondary | ICD-10-CM | POA: Diagnosis not present

## 2015-04-06 DIAGNOSIS — M629 Disorder of muscle, unspecified: Secondary | ICD-10-CM | POA: Diagnosis not present

## 2015-04-06 DIAGNOSIS — M25562 Pain in left knee: Secondary | ICD-10-CM | POA: Insufficient documentation

## 2015-04-06 DIAGNOSIS — M25612 Stiffness of left shoulder, not elsewhere classified: Secondary | ICD-10-CM | POA: Insufficient documentation

## 2015-04-06 DIAGNOSIS — M6289 Other specified disorders of muscle: Secondary | ICD-10-CM

## 2015-04-06 DIAGNOSIS — I10 Essential (primary) hypertension: Secondary | ICD-10-CM | POA: Diagnosis not present

## 2015-04-06 DIAGNOSIS — S82142G Displaced bicondylar fracture of left tibia, subsequent encounter for closed fracture with delayed healing: Secondary | ICD-10-CM | POA: Diagnosis not present

## 2015-04-06 DIAGNOSIS — E785 Hyperlipidemia, unspecified: Secondary | ICD-10-CM | POA: Diagnosis not present

## 2015-04-06 DIAGNOSIS — Z79899 Other long term (current) drug therapy: Secondary | ICD-10-CM | POA: Diagnosis not present

## 2015-04-06 NOTE — Therapy (Signed)
[place], [place], [address] Phone: [phone]   Fax:  [phone]  Occupational Therapy Treatment  Patient Details  Name: Carol Gutierrez MRN: [id number] Date of Birth: 06/27/1935 Referring Provider: Driscilla Moats  Encounter Date: 04/06/2015      OT End of Session - 04/06/15 1605    Visit Number 20   Number of Visits 36   Date for OT Re-Evaluation 05/25/15  mini reassess on 04/23/15   Authorization Type 1-Medicare/Medicare A & B;  2- [employer]   Authorization Time Period Before 27th visit   Authorization - Visit Number 43   Authorization - Number of Visits 27   OT Start Time 1432   OT Stop Time 1513   OT Time Calculation (min) 41 min   Activity Tolerance Patient tolerated treatment well   Behavior During Therapy [facility] for tasks assessed/performed      Past Medical History  Diagnosis Date  . Hypertension   . Arthritis     hands and back    Past Surgical History  Procedure Laterality Date  . Joint replacement  2008    left hip  . Colonoscopy N/A 09/08/2012    Procedure: COLONOSCOPY;  Surgeon: Rogene Houston, MD;  [facility];  Service: Endoscopy;  Laterality: N/A;  730-moved to 830 Ann to notify pt  . Total hip arthroplasty Left Left Hip Replacement    There were no vitals filed for this visit.  Visit Diagnosis:  Stiffness of shoulder joint, left  Tight fascia  Muscle weakness of left arm  Pain in left shoulder      Subjective Assessment - 04/06/15 1434    Subjective  S: I do 15 of each exercise at home with my 1# box of brown sugar.    Currently in Pain? No/denies            [facility] OT Assessment - 04/06/15 1433    Assessment   Diagnosis left proximal humerus fracture   Precautions   Precautions None   Precaution Comments Progress as tolerated. Begin strengthening as able. Modalities PRN                  OT Treatments/Exercises (OP) - 04/06/15 1434    Exercises   Exercises Shoulder   Shoulder Exercises: Supine   Protraction PROM;5 reps;Strengthening;15 reps   Protraction Weight (lbs) 1   Horizontal ABduction PROM;5 reps;Strengthening;15 reps   Horizontal ABduction Weight (lbs) 1   External Rotation PROM;5 reps;Strengthening;15 reps   External Rotation Weight (lbs) 1   Internal Rotation PROM;5 reps;Strengthening;15 reps   Internal Rotation Weight (lbs) 1   Flexion PROM;5 reps;Strengthening;15 reps   Shoulder Flexion Weight (lbs) 1   ABduction PROM;5 reps;Strengthening;15 reps   Shoulder ABduction Weight (lbs) 1   Shoulder Exercises: Stretch   Corner Stretch 3 reps;10 seconds   Cross Chest Stretch 3 reps;10 seconds   Internal Rotation Stretch 3 reps  10 seconds   Wall Stretch - Flexion 3 reps;10 seconds   Other Shoulder Stretches retraction 3 reps 10 seconds   Manual Therapy   Manual Therapy Myofascial release;Muscle Energy Technique   Manual therapy comments manual therapy completed prior to therapy exercises.   Myofascial Release Myofascial release to left upper arm, trapezius, and scapularis regions to decrease pain and fascial restrictions and increase joint range of motion.    Muscle Energy Technique Muscle energy technique to left UE to decrease muscle spasm and increase ROM.  Balance Exercises - 04/06/15 1602    Balance Exercises: Standing   Balance Beam retro,forward and side step x 2 RT; forward and side step with hurdles x 2 RT              OT Short Term Goals - 04/03/15 1210    OT SHORT TERM GOAL #1   Title Pt will be educated on and independent in HEP.    Time 3   Period Weeks   OT SHORT TERM GOAL #2   Title Pt will decrease pain to 3/10 in LUE.    Time 3   Period Weeks   OT SHORT TERM GOAL #3   Title Pt will decrease fascial restrictions from max to mod amount in LUE.    Time 3   Period Weeks   OT SHORT TERM GOAL #4   Title Pt will increase AROM to [facility] to increase ability to donn  shirts without compensatory strategies.    Time 3   Period Weeks   Status On-going   OT SHORT TERM GOAL #5   Title Pt will increase strength to 3/5 to increase ability wash hair using BUE.    Time 3   Period Weeks   Status On-going           OT Long Term Goals - 01/26/15 1529    OT LONG TERM GOAL #1   Title Pt will return to prior level of functioning and independence in ADL and leisure tasks.    Time 6   Period Weeks   Status On-going   OT LONG TERM GOAL #2   Title Pt will decrease pain to 1/10 or less during daily tasks.    Time 6   Period Weeks   Status On-going   OT LONG TERM GOAL #3   Title Pt will decrease fascial restrictions from mod to min amounts or less in LUE.    Time 6   Period Weeks   Status On-going   OT LONG TERM GOAL #4   Title Pt will increase AROM to WNL to increase ability to reach into overhead cabinets.    Time 6   Period Weeks   Status On-going   OT LONG TERM GOAL #5   Title Pt will increase strength to 4/5 to increase ability to hold lightweight objects.    Time 6   Period Weeks   Status On-going               Plan - 04/06/15 1605    Clinical Impression Statement A: Continued shoulder strengthening exercises, reviewed shoulder stretching HEP, verbal cuing required for form and technique. Pt with increased muscle fatigue at end of session.    Plan P: Add/resume scapular theraband. Provide ER stretch-stargazer, doorframe        Problem List There are no active problems to display for this patient.   [name], OTR/L  [phone]  04/06/2015, 4:08 PM  [place] [address] [place], [place], [address] Phone: [phone]   Fax:  [phone]  Name: Carol Gutierrez MRN: [id number] Date of Birth: Jul 03, 1935

## 2015-04-06 NOTE — Therapy (Signed)
[place], [place], [address] Phone: [phone]   Fax:  [phone]  Physical Therapy Treatment  Patient Details  Name: Carol Gutierrez MRN: [id number] Date of Birth: 11-Jun-1935 No Data Recorded  Encounter Date: 04/06/2015      PT End of Session - 04/06/15 1609    Visit Number 18   Number of Visits 29   Date for PT Re-Evaluation 05/04/15   Authorization Type medicare    Authorization Time Period Authorized 01/25/2015-03/27/2015   Authorization - Visit Number 83   Authorization - Number of Visits 20   PT Start Time 6834   PT Stop Time 1558   PT Time Calculation (min) 43 min   Equipment Utilized During Treatment Gait belt   Activity Tolerance Patient tolerated treatment well   Behavior During Therapy [facility] for tasks assessed/performed      Past Medical History  Diagnosis Date  . Hypertension   . Arthritis     hands and back    Past Surgical History  Procedure Laterality Date  . Joint replacement  2008    left hip  . Colonoscopy N/A 09/08/2012    Procedure: COLONOSCOPY;  Surgeon: Rogene Houston, MD;  [facility];  Service: Endoscopy;  Laterality: N/A;  730-moved to 830 Ann to notify pt  . Total hip arthroplasty Left Left Hip Replacement    There were no vitals filed for this visit.  Visit Diagnosis:  Weakness of left leg  Pain in joint involving left lower leg  Abnormal gait      Subjective Assessment - 04/06/15 1516    Subjective Pt reports that she feels like her walking is improving. She has been having some difficulty with bending her knee all the way.    Currently in Pain? No/denies   Pain Score 0-No pain                         [facility] Adult PT Treatment/Exercise - 04/06/15 1520    Knee/Hip Exercises: Seated   Sit to Sand 10 reps  with R foot on 2" step to bias WB on LLE   Knee/Hip Exercises: Supine   Quad Sets 15 reps   Knee/Hip Exercises: Sidelying   Hip ABduction 15 reps   Knee/Hip Exercises: Prone   Hamstring Curl 15 reps   Hamstring Curl Limitations 4#   Hip Extension 10 reps;2 sets   Manual Therapy   Manual Therapy Joint mobilization   Manual therapy comments manual therapy performed prior to therex   Joint Mobilization grade III-IV A/P mobilizations of femur on tibia to improve extension             Balance Exercises - 04/06/15 1602    Balance Exercises: Standing   Balance Beam retro,forward and side step x 2 RT; forward and side step with hurdles x 2 RT              PT Short Term Goals - 04/04/15 0934    PT SHORT TERM GOAL #1   Title I HEP   Period Weeks   Status Achieved   PT SHORT TERM GOAL #2   Title Pt to be I in ambulation with rolling walker for 200 ft    Time 3   Period Weeks   Status Achieved   PT SHORT TERM GOAL #3   Title Pt to begin advance HEP ( closted chai)   Time 3  Period Weeks   Status Achieved           PT Long Term Goals - 04/04/15 0934    PT LONG TERM GOAL #1   Title Pt to be ambulating with a cane   Time 4   Period Weeks   Status Achieved   PT LONG TERM GOAL #2   Title Pt strength to be at least 4+/5 to be able to go up and down steps in a reciprocal manner.    Time 6   Period Weeks   Status On-going   PT LONG TERM GOAL #3   Title Pt to be walking in her home without an assistive device    Time 6   Period Weeks   Status Partially Met   PT LONG TERM GOAL #4   Title Pt to work out in the yard for 45 minutes without increased pain    Time 8   Period Weeks   Status On-going   PT LONG TERM GOAL #5   Title Pt to be able to SLS for 10 seconds to reduce risk of falling    Time 8   Period Weeks   Status Achieved               Plan - 04/06/15 1614    Clinical Impression Statement Continued with functional strengthening today with focus on hamstrings, glut max, and glut med to improve gait mechanics. Pt required verbal and tactile cueing during sidelying hip  abduction for proper form, weights were held due to pt being unable to complete full ROM without any weight. Sit to stands were completed with 2" step under RLE to bias weightbearing on LLE, pt was able to complete without pain and with minimal verbal cueing. Pt performed well with balance activities today, requiring CGA from PT for safety and to prevent LOB.   PT Next Visit Plan Continue with manual to improve knee extension, TKEs, glut strengthening        Problem List There are no active problems to display for this patient.   Hilma Favors, PT, DPT [phone] 04/06/2015, 4:20 PM  [place], [place], [address] Phone: [phone]   Fax:  [phone]  Name: Carol Gutierrez MRN: [id number] Date of Birth: 10-Jun-1935

## 2015-04-09 ENCOUNTER — Encounter (HOSPITAL_COMMUNITY): Payer: Medicare Other | Admitting: Specialist

## 2015-04-09 ENCOUNTER — Encounter (HOSPITAL_COMMUNITY): Payer: Medicare Other | Admitting: Physical Therapy

## 2015-04-10 ENCOUNTER — Encounter (HOSPITAL_COMMUNITY): Payer: Self-pay | Admitting: Occupational Therapy

## 2015-04-10 ENCOUNTER — Ambulatory Visit (HOSPITAL_COMMUNITY): Payer: Medicare Other | Admitting: Occupational Therapy

## 2015-04-10 ENCOUNTER — Ambulatory Visit (HOSPITAL_COMMUNITY): Payer: Medicare Other | Admitting: Physical Therapy

## 2015-04-10 DIAGNOSIS — M25612 Stiffness of left shoulder, not elsewhere classified: Secondary | ICD-10-CM | POA: Diagnosis not present

## 2015-04-10 DIAGNOSIS — S82142G Displaced bicondylar fracture of left tibia, subsequent encounter for closed fracture with delayed healing: Secondary | ICD-10-CM

## 2015-04-10 DIAGNOSIS — M25562 Pain in left knee: Secondary | ICD-10-CM | POA: Diagnosis not present

## 2015-04-10 DIAGNOSIS — R29898 Other symptoms and signs involving the musculoskeletal system: Secondary | ICD-10-CM | POA: Diagnosis not present

## 2015-04-10 DIAGNOSIS — M629 Disorder of muscle, unspecified: Secondary | ICD-10-CM

## 2015-04-10 DIAGNOSIS — R269 Unspecified abnormalities of gait and mobility: Secondary | ICD-10-CM | POA: Diagnosis not present

## 2015-04-10 DIAGNOSIS — M25512 Pain in left shoulder: Secondary | ICD-10-CM

## 2015-04-10 DIAGNOSIS — M6289 Other specified disorders of muscle: Secondary | ICD-10-CM

## 2015-04-10 DIAGNOSIS — M6281 Muscle weakness (generalized): Secondary | ICD-10-CM

## 2015-04-10 NOTE — Therapy (Signed)
[place], [place], [address] Phone: [phone]   Fax:  [phone]  Occupational Therapy Treatment  Patient Details  Name: Carol Gutierrez MRN: [id number] Date of Birth: 1935-08-07 Referring Provider: Driscilla Moats  Encounter Date: 04/10/2015      OT End of Session - 04/10/15 1615    Visit Number 21   Number of Visits 36   Date for OT Re-Evaluation 05/25/15  mini reassess on 04/23/15   Authorization Type 1-Medicare/Medicare A & B;  2- A[employer]   Authorization Time Period Before 27th visit   Authorization - Visit Number 21   Authorization - Number of Visits 27   OT Start Time 1440   OT Stop Time 1518   OT Time Calculation (min) 38 min   Activity Tolerance Patient tolerated treatment well   Behavior During Therapy [facility] for tasks assessed/performed      Past Medical History  Diagnosis Date  . Hypertension   . Arthritis     hands and back    Past Surgical History  Procedure Laterality Date  . Joint replacement  2008    left hip  . Colonoscopy N/A 09/08/2012    Procedure: COLONOSCOPY;  Surgeon: Rogene Houston, MD;  [facility];  Service: Endoscopy;  Laterality: N/A;  730-moved to 830 Ann to notify pt  . Total hip arthroplasty Left Left Hip Replacement    There were no vitals filed for this visit.  Visit Diagnosis:  Stiffness of shoulder joint, left  Muscle weakness of left arm  Pain in left shoulder      Subjective Assessment - 04/10/15 1427    Subjective  S: Can I try a 2 pound weight today?   Currently in Pain? No/denies            [facility] OT Assessment - 04/10/15 1427    Assessment   Diagnosis left proximal humerus fracture   Precautions   Precautions None   Precaution Comments Progress as tolerated. Begin strengthening as able. Modalities PRN                  OT Treatments/Exercises (OP) - 04/10/15 1441    Exercises   Exercises Shoulder   Shoulder Exercises:  Supine   Protraction PROM;5 reps;Strengthening;10 reps   Protraction Weight (lbs) 2   Horizontal ABduction PROM;5 reps;Strengthening;10 reps   Horizontal ABduction Weight (lbs) 2  6 reps with 2#, 4 reps with 1#   External Rotation PROM;5 reps;Strengthening;15 reps   External Rotation Weight (lbs) 1   Internal Rotation PROM;5 reps;Strengthening;15 reps   Internal Rotation Weight (lbs) 1   Flexion PROM;5 reps;Strengthening;10 reps   Shoulder Flexion Weight (lbs) 2   ABduction PROM;5 reps;Strengthening;15 reps   Shoulder ABduction Weight (lbs) 1   Shoulder Exercises: Seated   Protraction Strengthening;10 reps   Protraction Weight (lbs) 1   Horizontal ABduction Strengthening;10 reps   Horizontal ABduction Weight (lbs) 1   External Rotation Strengthening;10 reps   External Rotation Weight (lbs) 1   Internal Rotation Strengthening;10 reps   Internal Rotation Weight (lbs) 1   Flexion Strengthening;10 reps   Flexion Weight (lbs) 1   Flexion Limitations able to achieve approximately 50% range   Abduction AROM;10 reps   ABduction Limitations able to achieve approximately 50% range    Shoulder Exercises: ROM/Strengthening   X to V Arms 12X through available range   Manual Therapy   Manual Therapy Myofascial release   Manual therapy comments  manual therapy performed prior to therex   Myofascial Release Myofascial release to left upper arm, trapezius, and scapularis regions to decrease pain and fascial restrictions and increase joint range of motion.    Muscle Energy Technique Muscle energy technique to left UE to decrease muscle spasm and increase ROM.              Balance Exercises - 04/10/15 1355    Balance Exercises: Standing   Balance Beam retro,forward and side step x 2 RT; forward and side step with hurdles x 2 RT              OT Short Term Goals - 04/03/15 1210    OT SHORT TERM GOAL #1   Title Pt will be educated on and independent in HEP.    Time 3   Period  Weeks   OT SHORT TERM GOAL #2   Title Pt will decrease pain to 3/10 in LUE.    Time 3   Period Weeks   OT SHORT TERM GOAL #3   Title Pt will decrease fascial restrictions from max to mod amount in LUE.    Time 3   Period Weeks   OT SHORT TERM GOAL #4   Title Pt will increase AROM to [facility] to increase ability to donn shirts without compensatory strategies.    Time 3   Period Weeks   Status On-going   OT SHORT TERM GOAL #5   Title Pt will increase strength to 3/5 to increase ability wash hair using BUE.    Time 3   Period Weeks   Status On-going           OT Long Term Goals - 01/26/15 1529    OT LONG TERM GOAL #1   Title Pt will return to prior level of functioning and independence in ADL and leisure tasks.    Time 6   Period Weeks   Status On-going   OT LONG TERM GOAL #2   Title Pt will decrease pain to 1/10 or less during daily tasks.    Time 6   Period Weeks   Status On-going   OT LONG TERM GOAL #3   Title Pt will decrease fascial restrictions from mod to min amounts or less in LUE.    Time 6   Period Weeks   Status On-going   OT LONG TERM GOAL #4   Title Pt will increase AROM to WNL to increase ability to reach into overhead cabinets.    Time 6   Period Weeks   Status On-going   OT LONG TERM GOAL #5   Title Pt will increase strength to 4/5 to increase ability to hold lightweight objects.    Time 6   Period Weeks   Status On-going               Plan - 04/10/15 1616    Clinical Impression Statement A: Pt requested to attempt 2# weight during supine strengthening exercises. Pt able to complete protraction, flexion, and some horizontal abduction with 2# weights; completed remaining exercises with 1# weight due to fatigue. Pt completed seated A/ROM and some strengthening with 1# weight this session. Pt requries extended time to complete exercises, therefore ER stretch and scapular theraband not completed due to time constraints.    Plan P: Add/resume scapular  theraband. ER stretch-stargazer, doorframe. Attempt to complete supine ER/IR with 2# weight        Problem List There are no active problems to  display for this patient.   [name], OTR/L  [phone]  04/10/2015, 4:19 PM  [place] [address] [place], [place], [address] Phone: [phone]   Fax:  [phone]  Name: Carol Gutierrez MRN: [id number] Date of Birth: 1936-01-23

## 2015-04-10 NOTE — Therapy (Signed)
[place], [place], [address] Phone: [phone]   Fax:  [phone]  Physical Therapy Treatment  Patient Details  Name: Carol Gutierrez MRN: [id number] Date of Birth: June 01, 1935 No Data Recorded  Encounter Date: 04/10/2015      PT End of Session - 04/10/15 1506    Visit Number 19   Number of Visits 29   Date for PT Re-Evaluation 05/04/15   Authorization Type medicare    Authorization Time Period Authorized 01/25/2015-03/27/2015   Authorization - Visit Number 29   Authorization - Number of Visits 20   PT Start Time 1350   PT Stop Time 1434   PT Time Calculation (min) 44 min   Equipment Utilized During Treatment Gait belt   Activity Tolerance Patient tolerated treatment well   Behavior During Therapy [facility] for tasks assessed/performed      Past Medical History  Diagnosis Date  . Hypertension   . Arthritis     hands and back    Past Surgical History  Procedure Laterality Date  . Joint replacement  2008    left hip  . Colonoscopy N/A 09/08/2012    Procedure: COLONOSCOPY;  Surgeon: Rogene Houston, MD;  [facility];  Service: Endoscopy;  Laterality: N/A;  730-moved to 830 Ann to notify pt  . Total hip arthroplasty Left Left Hip Replacement    There were no vitals filed for this visit.  Visit Diagnosis:  Weakness of left leg  Pain in joint involving left lower leg  Abnormal gait  Tight fascia  Tibial plateau fracture, left, closed, with delayed healing, subsequent encounter      Subjective Assessment - 04/10/15 1505    Subjective PT states the rain is making her knee sore.  No pain, just achey soreness   Currently in Pain? No/denies                         [facility] Adult PT Treatment/Exercise - 04/10/15 0001    Knee/Hip Exercises: Seated   Sit to Sand 10 reps   Knee/Hip Exercises: Supine   Quad Sets 15 reps   Knee Extension PROM   Knee Flexion PROM   Knee/Hip  Exercises: Sidelying   Hip ABduction 10 reps;2 sets;Left   Knee/Hip Exercises: Prone   Hamstring Curl 15 reps;2 sets   Hamstring Curl Limitations 4#   Hip Extension Both;2 sets;10 reps             Balance Exercises - 04/10/15 1355    Balance Exercises: Standing   Balance Beam retro,forward and side step x 2 RT; forward and side step with hurdles x 2 RT              PT Short Term Goals - 04/04/15 0934    PT SHORT TERM GOAL #1   Title I HEP   Period Weeks   Status Achieved   PT SHORT TERM GOAL #2   Title Pt to be I in ambulation with rolling walker for 200 ft    Time 3   Period Weeks   Status Achieved   PT SHORT TERM GOAL #3   Title Pt to begin advance HEP ( closted chai)   Time 3   Period Weeks   Status Achieved           PT Long Term Goals - 04/04/15 0934    PT LONG TERM GOAL #1   Title Pt  to be ambulating with a cane   Time 4   Period Weeks   Status Achieved   PT LONG TERM GOAL #2   Title Pt strength to be at least 4+/5 to be able to go up and down steps in a reciprocal manner.    Time 6   Period Weeks   Status On-going   PT LONG TERM GOAL #3   Title Pt to be walking in her home without an assistive device    Time 6   Period Weeks   Status Partially Met   PT LONG TERM GOAL #4   Title Pt to work out in the yard for 45 minutes without increased pain    Time 8   Period Weeks   Status On-going   PT LONG TERM GOAL #5   Title Pt to be able to SLS for 10 seconds to reduce risk of falling    Time 8   Period Weeks   Status Achieved               Plan - 04/10/15 1507    Clinical Impression Statement Strengthening and stability actvities continued for bilateral LE.  Pt tends to use UE's on balance beam but able to focus and not rely on UE's when concentrates.  Overall improving stability with decreased need of AD and improved gait quality.    PT Next Visit Plan Continue with manual to improve knee extension, TKEs, glut strengthening.  complete  FOTO for gcode update next session, reeval was completed X 2 visits ago.  Pt is KX modifier        Problem List There are no active problems to display for this patient.   Teena Irani, PTA/CLT [phone]  04/10/2015, 3:12 PM  [place] [address] [place], [place], [address] Phone: [phone]   Fax:  [phone]  Name: Carol Gutierrez MRN: [id number] Date of Birth: 1935/10/14

## 2015-04-11 ENCOUNTER — Ambulatory Visit (HOSPITAL_COMMUNITY): Payer: Medicare Other | Admitting: Physical Therapy

## 2015-04-11 DIAGNOSIS — R29898 Other symptoms and signs involving the musculoskeletal system: Secondary | ICD-10-CM

## 2015-04-11 DIAGNOSIS — R269 Unspecified abnormalities of gait and mobility: Secondary | ICD-10-CM | POA: Diagnosis not present

## 2015-04-11 DIAGNOSIS — M25612 Stiffness of left shoulder, not elsewhere classified: Secondary | ICD-10-CM | POA: Diagnosis not present

## 2015-04-11 DIAGNOSIS — S82142G Displaced bicondylar fracture of left tibia, subsequent encounter for closed fracture with delayed healing: Secondary | ICD-10-CM | POA: Diagnosis not present

## 2015-04-11 DIAGNOSIS — M25562 Pain in left knee: Secondary | ICD-10-CM | POA: Diagnosis not present

## 2015-04-11 DIAGNOSIS — M629 Disorder of muscle, unspecified: Secondary | ICD-10-CM | POA: Diagnosis not present

## 2015-04-11 NOTE — Therapy (Signed)
[place], [place], [address] Phone: [phone]   Fax:  [phone]  Physical Therapy Treatment  Patient Details  Name: Carol Gutierrez MRN: [id number] Date of Birth: [birth year] No Data Recorded  Encounter Date: 04/11/2015      PT End of Session - 04/11/15 1435    Visit Number 20   Number of Visits 29   Date for PT Re-Evaluation 05/04/15   Authorization Type medicare    Authorization Time Period Authorized 01/25/2015-03/27/2015   Authorization - Visit Number 6   Authorization - Number of Visits 27   PT Start Time 4680   PT Stop Time 1429   PT Time Calculation (min) 42 min   Equipment Utilized During Treatment Gait belt   Activity Tolerance Patient tolerated treatment well   Behavior During Therapy [facility] for tasks assessed/performed      Past Medical History  Diagnosis Date  . Hypertension   . Arthritis     hands and back    Past Surgical History  Procedure Laterality Date  . Joint replacement  2008    left hip  . Colonoscopy N/A 09/08/2012    Procedure: COLONOSCOPY;  Surgeon: Rogene Houston, MD;  [facility];  Service: Endoscopy;  Laterality: N/A;  730-moved to 830 Ann to notify pt  . Total hip arthroplasty Left Left Hip Replacement    There were no vitals filed for this visit.  Visit Diagnosis:  Weakness of left leg  Pain in joint involving left lower leg  Abnormal gait      Subjective Assessment - 04/11/15 1352    Subjective Pt reports that she is having some soreness in her knee, but she doesn't have any real pain.    Currently in Pain? No/denies   Pain Score 0-No pain                         [facility] Adult PT Treatment/Exercise - 04/11/15 0001    Knee/Hip Exercises: Standing   Other Standing Knee Exercises sidestepping with RTB x 2RT   Knee/Hip Exercises: Supine   Quad Sets 15 reps   Quad Sets Limitations 3" hold   Short Arc Quad Sets 15 reps   Short  Arc Quad Sets Limitations 3#   Bridges 15 reps   Manual Therapy   Manual Therapy Joint mobilization   Manual therapy comments manual therapy performed prior to therex   Joint Mobilization grade III-IV A/P mobilizations of femur on tibia to improve extension             Balance Exercises - 04/11/15 1433    Balance Exercises: Standing   Balance Beam forward and sidestep with hurdles x 2RT             PT Short Term Goals - 04/04/15 0934    PT SHORT TERM GOAL #1   Title I HEP   Period Weeks   Status Achieved   PT SHORT TERM GOAL #2   Title Pt to be I in ambulation with rolling walker for 200 ft    Time 3   Period Weeks   Status Achieved   PT SHORT TERM GOAL #3   Title Pt to begin advance HEP ( closted chai)   Time 3   Period Weeks   Status Achieved           PT Long Term Goals - 04/04/15 0934    PT LONG  TERM GOAL #1   Title Pt to be ambulating with a cane   Time 4   Period Weeks   Status Achieved   PT LONG TERM GOAL #2   Title Pt strength to be at least 4+/5 to be able to go up and down steps in a reciprocal manner.    Time 6   Period Weeks   Status On-going   PT LONG TERM GOAL #3   Title Pt to be walking in her home without an assistive device    Time 6   Period Weeks   Status Partially Met   PT LONG TERM GOAL #4   Title Pt to work out in the yard for 45 minutes without increased pain    Time 8   Period Weeks   Status On-going   PT LONG TERM GOAL #5   Title Pt to be able to SLS for 10 seconds to reduce risk of falling    Time 8   Period Weeks   Status Achieved               Plan - 2015/05/01 1437    Clinical Impression Statement Treatment session began with manual therapy to L tibiofemoral joint to improve knee extension. Supine strengthening was completed for improving quad strength in terminal range, pt continues to lack full extension. Balance training was completed with pt stepping over hurdles going forward and sideways on solid  ground and on balance beam in order to improve hip and knee flexion during gait and to decrease fall risk. Pt required min A when completing the exercise on the balance beam to prevent LOB, pt had increased difficulty with sidestepping over the hurdles today. Pt denied any increased pain post treatment.    PT Next Visit Plan Continue with manual joint mobs to improve extension, TKEs, balance training          G-Codes - 05/01/2015 1447    Functional Assessment Tool Used Clinical judgement: balance, gait mechanics, muscle strength   Functional Limitation Mobility: Walking and moving around   Mobility: Walking and Moving Around Current Status (F8182) At least 40 percent but less than 60 percent impaired, limited or restricted   Mobility: Walking and Moving Around Goal Status (X9371) At least 20 percent but less than 40 percent impaired, limited or restricted      Problem List There are no active problems to display for this patient.   Hilma Favors, PT, DPT [phone] May 01, 2015, 2:50 PM  [place], [place], [address] Phone: [phone]   Fax:  [phone]  Name: Carol Gutierrez MRN: [id number] Date of Birth: 1935-11-29

## 2015-04-13 ENCOUNTER — Ambulatory Visit (HOSPITAL_COMMUNITY): Payer: Medicare Other

## 2015-04-13 ENCOUNTER — Ambulatory Visit (HOSPITAL_COMMUNITY): Payer: Medicare Other | Admitting: Physical Therapy

## 2015-04-13 ENCOUNTER — Encounter (HOSPITAL_COMMUNITY): Payer: Self-pay

## 2015-04-13 DIAGNOSIS — R29898 Other symptoms and signs involving the musculoskeletal system: Secondary | ICD-10-CM | POA: Diagnosis not present

## 2015-04-13 DIAGNOSIS — M25612 Stiffness of left shoulder, not elsewhere classified: Secondary | ICD-10-CM | POA: Diagnosis not present

## 2015-04-13 DIAGNOSIS — R269 Unspecified abnormalities of gait and mobility: Secondary | ICD-10-CM

## 2015-04-13 DIAGNOSIS — S82142G Displaced bicondylar fracture of left tibia, subsequent encounter for closed fracture with delayed healing: Secondary | ICD-10-CM | POA: Diagnosis not present

## 2015-04-13 DIAGNOSIS — M629 Disorder of muscle, unspecified: Secondary | ICD-10-CM | POA: Diagnosis not present

## 2015-04-13 DIAGNOSIS — M25562 Pain in left knee: Secondary | ICD-10-CM

## 2015-04-13 DIAGNOSIS — M6289 Other specified disorders of muscle: Secondary | ICD-10-CM

## 2015-04-13 DIAGNOSIS — M6281 Muscle weakness (generalized): Secondary | ICD-10-CM

## 2015-04-13 NOTE — Patient Instructions (Signed)

## 2015-04-13 NOTE — Therapy (Signed)
[place], [place], [address] Phone: [phone]   Fax:  [phone]  Physical Therapy Treatment  Patient Details  Name: Carol Gutierrez MRN: [id number] Date of Birth: Sep 24, 1935 No Data Recorded  Encounter Date: 04/13/2015      PT End of Session - 04/13/15 1530    Visit Number 21   Number of Visits 29   Date for PT Re-Evaluation 05/04/15   Authorization Type medicare    Authorization Time Period Authorized 01/25/2015-03/27/2015   Authorization - Visit Number 21   Authorization - Number of Visits 27   PT Start Time 6962   PT Stop Time 1428   PT Time Calculation (min) 43 min   Equipment Utilized During Treatment Gait belt   Activity Tolerance Patient tolerated treatment well   Behavior During Therapy [facility]FL for tasks assessed/performed      Past Medical History  Diagnosis Date  . Hypertension   . Arthritis     hands and back    Past Surgical History  Procedure Laterality Date  . Joint replacement  2008    left hip  . Colonoscopy N/A 09/08/2012    Procedure: COLONOSCOPY;  Surgeon: Rogene Houston, MD;  [facility];  Service: Endoscopy;  Laterality: N/A;  730-moved to 830 Ann to notify pt  . Total hip arthroplasty Left Left Hip Replacement    There were no vitals filed for this visit.  Visit Diagnosis:  Weakness of left leg  Pain in joint involving left lower leg  Abnormal gait      Subjective Assessment - 04/13/15 1350    Subjective Pt reports that she has been able to bend her knee more with less pain.    Currently in Pain? Yes   Pain Score 3    Pain Location Knee   Pain Orientation Left                         [facility] Adult PT Treatment/Exercise - 04/13/15 0001    Knee/Hip Exercises: Seated   Long Arc Quad 15 reps   Long Arc Quad Weight 4 lbs.   Knee/Hip Exercises: Supine   Quad Sets 15 reps   Quad Sets Limitations 3" hold   Short Arc Quad Sets 15 reps;2  sets   Short Arc Quad Sets Limitations 4#   Bridges 15 reps;2 sets   Manual Therapy   Manual Therapy Joint mobilization   Manual therapy comments manual therapy performed prior to therex   Joint Mobilization grade III-IV A/P mobilizations of femur on tibia to improve extension             Balance Exercises - 04/13/15 1529    Balance Exercises: Standing   Step Over Hurdles / Cones forward and sideways x 2RT             PT Short Term Goals - 04/04/15 0934    PT SHORT TERM GOAL #1   Title I HEP   Period Weeks   Status Achieved   PT SHORT TERM GOAL #2   Title Pt to be I in ambulation with rolling walker for 200 ft    Time 3   Period Weeks   Status Achieved   PT SHORT TERM GOAL #3   Title Pt to begin advance HEP ( closted chai)   Time 3   Period Weeks   Status Achieved  PT Long Term Goals - 04/04/15 0934    PT LONG TERM GOAL #1   Title Pt to be ambulating with a cane   Time 4   Period Weeks   Status Achieved   PT LONG TERM GOAL #2   Title Pt strength to be at least 4+/5 to be able to go up and down steps in a reciprocal manner.    Time 6   Period Weeks   Status On-going   PT LONG TERM GOAL #3   Title Pt to be walking in her home without an assistive device    Time 6   Period Weeks   Status Partially Met   PT LONG TERM GOAL #4   Title Pt to work out in the yard for 45 minutes without increased pain    Time 8   Period Weeks   Status On-going   PT LONG TERM GOAL #5   Title Pt to be able to SLS for 10 seconds to reduce risk of falling    Time 8   Period Weeks   Status Achieved               Plan - 04/13/15 1530    Clinical Impression Statement Treatment session began with joint mobilizations to L tibiofemoral joint to improve knee extension and normalize gait mechanics. Pt completed quad strengthening in supine and in sitting today, with verbal cueing given to achieve full extension. Pt had increased difficulty with sidestepping over  hurdles today, compensating with hip flexion as opposed to hip abduction, requiring verbal cueing throughout the exercise for proper form. Pt required cueing during ambulation today for heel strike and proper push off in order to normalize gait mechanics.    PT Next Visit Plan Resume TKE's, continue with manual therapy        Problem List There are no active problems to display for this patient.   Hilma Favors, PT, DPT [phone] 04/13/2015, 3:35 PM  [place], [place], [address] Phone: [phone]   Fax:  [phone]  Name: EVALEE GERARD MRN: [id number] Date of Birth: [birth year]

## 2015-04-13 NOTE — Therapy (Signed)
[place], [place], [address] Phone: [phone]   Fax:  [phone]  Occupational Therapy Treatment  Patient Details  Name: Carol Gutierrez MRN: [id number] Date of Birth: 21-Aug-1935 Referring Provider: Driscilla Moats  Encounter Date: 04/13/2015      OT End of Session - 04/13/15 1523    Visit Number 22   Number of Visits 90   Date for OT Re-Evaluation 05/25/15  mini reassess on 04/23/15   Authorization Type 1-Medicare/Medicare A & B;  2- [employer]   Authorization Time Period Before 27th visit   Authorization - Visit Number 77   Authorization - Number of Visits 69   OT Start Time 1440  pt was visiting/talking to friend and we started late   OT Stop Time 1515   OT Time Calculation (min) 35 min   Activity Tolerance Patient tolerated treatment well   Behavior During Therapy [facility] for tasks assessed/performed      Past Medical History  Diagnosis Date  . Hypertension   . Arthritis     hands and back    Past Surgical History  Procedure Laterality Date  . Joint replacement  2008    left hip  . Colonoscopy N/A 09/08/2012    Procedure: COLONOSCOPY;  Surgeon: Rogene Houston, MD;  [facility];  Service: Endoscopy;  Laterality: N/A;  730-moved to 830 Ann to notify pt  . Total hip arthroplasty Left Left Hip Replacement    There were no vitals filed for this visit.  Visit Diagnosis:  Stiffness of shoulder joint, left  Muscle weakness of left arm  Tight fascia      Subjective Assessment - 04/13/15 1458    Subjective  S: My arm isn't painful it's only a little sore from working it.    Currently in Pain? No/denies            [facility] OT Assessment - 04/13/15 1459    Assessment   Diagnosis left proximal humerus fracture   Precautions   Precautions None   Precaution Comments Progress as tolerated. Begin strengthening as able. Modalities PRN                  OT Treatments/Exercises  (OP) - 04/13/15 1500    Exercises   Exercises Shoulder   Shoulder Exercises: Supine   Protraction PROM;5 reps;Strengthening;10 reps   Protraction Weight (lbs) 2   Horizontal ABduction PROM;5 reps;Strengthening;10 reps   Horizontal ABduction Weight (lbs) 2   External Rotation PROM;5 reps;Strengthening;10 reps   External Rotation Weight (lbs) 2   Internal Rotation PROM;5 reps;Strengthening;10 reps   Internal Rotation Weight (lbs) 2   Flexion PROM;5 reps;Strengthening;10 reps   Shoulder Flexion Weight (lbs) 2   ABduction PROM;5 reps;Strengthening;10 reps   Shoulder ABduction Weight (lbs) 2   Shoulder Exercises: Seated   External Rotation Strengthening;10 reps   External Rotation Weight (lbs) 1   Internal Rotation Strengthening;10 reps   Internal Rotation Weight (lbs) 1   Flexion Strengthening;10 reps   Flexion Weight (lbs) 1   Flexion Limitations able to achieve approximately 50% range   Abduction Strengthening;10 reps;Limitations   ABduction Weight (lbs) 1   ABduction Limitations able to achieve approximately 50% range    Shoulder Exercises: Standing   Extension Theraband;12 reps   Theraband Level (Shoulder Extension) Level 2 (Red)   Row Theraband;12 reps   Theraband Level (Shoulder Row) Level 2 (Red)   Retraction Theraband;12 reps   Theraband Level (Shoulder Retraction)  Level 2 (Red)   Shoulder Exercises: ROM/Strengthening   Proximal Shoulder Strengthening, Supine 10X with 1#   Manual Therapy   Manual Therapy Myofascial release   Manual therapy comments manual therapy performed prior to therex   Myofascial Release Myofascial release to left upper arm, trapezius, and scapularis regions to decrease pain and fascial restrictions and increase joint range of motion.              Balance Exercises - 04/13/15 1529    Balance Exercises: Standing   Step Over Hurdles / Cones forward and sideways x 2RT           OT Education - 04/13/15 1528    Education provided Yes    Education Details Scapular theraband exercises   Person(s) Educated Patient   Methods Explanation;Demonstration;Handout   Comprehension Returned demonstration;Verbalized understanding          OT Short Term Goals - 04/03/15 1210    OT SHORT TERM GOAL #1   Title Pt will be educated on and independent in HEP.    Time 3   Period Weeks   OT SHORT TERM GOAL #2   Title Pt will decrease pain to 3/10 in LUE.    Time 3   Period Weeks   OT SHORT TERM GOAL #3   Title Pt will decrease fascial restrictions from max to mod amount in LUE.    Time 3   Period Weeks   OT SHORT TERM GOAL #4   Title Pt will increase AROM to [facility] to increase ability to donn shirts without compensatory strategies.    Time 3   Period Weeks   Status On-going   OT SHORT TERM GOAL #5   Title Pt will increase strength to 3/5 to increase ability wash hair using BUE.    Time 3   Period Weeks   Status On-going           OT Long Term Goals - 01/26/15 1529    OT LONG TERM GOAL #1   Title Pt will return to prior level of functioning and independence in ADL and leisure tasks.    Time 6   Period Weeks   Status On-going   OT LONG TERM GOAL #2   Title Pt will decrease pain to 1/10 or less during daily tasks.    Time 6   Period Weeks   Status On-going   OT LONG TERM GOAL #3   Title Pt will decrease fascial restrictions from mod to min amounts or less in LUE.    Time 6   Period Weeks   Status On-going   OT LONG TERM GOAL #4   Title Pt will increase AROM to WNL to increase ability to reach into overhead cabinets.    Time 6   Period Weeks   Status On-going   OT LONG TERM GOAL #5   Title Pt will increase strength to 4/5 to increase ability to hold lightweight objects.    Time 6   Period Weeks   Status On-going               Plan - 04/13/15 1525    Clinical Impression Statement A:Pt was able to complete all supine exercises with 2#. Abduction required modification as she was unable to achieve full ROM.  Pt was given scapular theraband for HEP.   Plan P: Add Stargazer stretch.         Problem List There are no active problems to display for this  patient.   Ailene Ravel, OTR/L,CBIS  [phone]  04/13/2015, 3:29 PM  [place] [address] [place], [place], [address] Phone: [phone]   Fax:  [phone]  Name: KAISY JAKAB MRN: [id number] Date of Birth: [birth year]

## 2015-04-16 ENCOUNTER — Ambulatory Visit (HOSPITAL_COMMUNITY): Payer: Medicare Other | Admitting: Physical Therapy

## 2015-04-16 ENCOUNTER — Ambulatory Visit (HOSPITAL_COMMUNITY): Payer: Medicare Other | Admitting: Specialist

## 2015-04-16 DIAGNOSIS — R29898 Other symptoms and signs involving the musculoskeletal system: Secondary | ICD-10-CM | POA: Diagnosis not present

## 2015-04-16 DIAGNOSIS — R269 Unspecified abnormalities of gait and mobility: Secondary | ICD-10-CM

## 2015-04-16 DIAGNOSIS — M25612 Stiffness of left shoulder, not elsewhere classified: Secondary | ICD-10-CM

## 2015-04-16 DIAGNOSIS — M25562 Pain in left knee: Secondary | ICD-10-CM | POA: Diagnosis not present

## 2015-04-16 DIAGNOSIS — S82142G Displaced bicondylar fracture of left tibia, subsequent encounter for closed fracture with delayed healing: Secondary | ICD-10-CM | POA: Diagnosis not present

## 2015-04-16 DIAGNOSIS — M629 Disorder of muscle, unspecified: Secondary | ICD-10-CM | POA: Diagnosis not present

## 2015-04-16 DIAGNOSIS — M6281 Muscle weakness (generalized): Secondary | ICD-10-CM

## 2015-04-16 NOTE — Therapy (Signed)
[place], [place], [address] Phone: [phone]   Fax:  [phone]  Physical Therapy Treatment  Patient Details  Name: Carol Gutierrez MRN: [id number] Date of Birth: 16-Feb-1936 No Data Recorded  Encounter Date: 04/16/2015      PT End of Session - 04/16/15 1444    Visit Number 22   Number of Visits 29   Date for PT Re-Evaluation 05/04/15   Authorization Type medicare    Authorization Time Period Authorized 01/25/2015-03/27/2015   Authorization - Visit Number 45   Authorization - Number of Visits 27   PT Start Time 1345   PT Stop Time 1429   PT Time Calculation (min) 44 min   Equipment Utilized During Treatment Gait belt   Activity Tolerance Patient tolerated treatment well   Behavior During Therapy [facility] for tasks assessed/performed      Past Medical History  Diagnosis Date  . Hypertension   . Arthritis     hands and back    Past Surgical History  Procedure Laterality Date  . Joint replacement  2008    left hip  . Colonoscopy N/A 09/08/2012    Procedure: COLONOSCOPY;  Surgeon: Rogene Houston, MD;  [facility];  Service: Endoscopy;  Laterality: N/A;  730-moved to 830 Ann to notify pt  . Total hip arthroplasty Left Left Hip Replacement    There were no vitals filed for this visit.  Visit Diagnosis:  Weakness of left leg  Pain in joint involving left lower leg  Abnormal gait      Subjective Assessment - 04/16/15 1351    Subjective Pt reports that her shoulder is a little sore today, but her knee feels ok.    Currently in Pain? No/denies   Pain Score 0-No pain                         [facility] Adult PT Treatment/Exercise - 04/16/15 1358    Knee/Hip Exercises: Stretches   Active Hamstring Stretch 3 reps;30 seconds   Active Hamstring Stretch Limitations 12" ste   Gastroc Stretch 3 reps;30 seconds   Gastroc Stretch Limitations slantboard   Knee/Hip Exercises:  Supine   Quad Sets 15 reps   Quad Sets Limitations 3" hold   Short Arc Quad Sets 15 reps;2 sets   Short Arc Quad Sets Limitations 4#   Bridges 15 reps;2 sets   Manual Therapy   Manual Therapy Joint mobilization   Manual therapy comments manual therapy performed prior to therex   Joint Mobilization grade III-IV A/P mobilizations of femur on tibia to improve extension             Balance Exercises - 04/16/15 1430    Balance Exercises: Standing   Tandem Gait 2 reps   Step Over Hurdles / Cones forward x 2RT             PT Short Term Goals - 04/04/15 0934    PT SHORT TERM GOAL #1   Title I HEP   Period Weeks   Status Achieved   PT SHORT TERM GOAL #2   Title Pt to be I in ambulation with rolling walker for 200 ft    Time 3   Period Weeks   Status Achieved   PT SHORT TERM GOAL #3   Title Pt to begin advance HEP ( closted chai)   Time 3   Period Weeks   Status Achieved  PT Long Term Goals - 04/04/15 0934    PT LONG TERM GOAL #1   Title Pt to be ambulating with a cane   Time 4   Period Weeks   Status Achieved   PT LONG TERM GOAL #2   Title Pt strength to be at least 4+/5 to be able to go up and down steps in a reciprocal manner.    Time 6   Period Weeks   Status On-going   PT LONG TERM GOAL #3   Title Pt to be walking in her home without an assistive device    Time 6   Period Weeks   Status Partially Met   PT LONG TERM GOAL #4   Title Pt to work out in the yard for 45 minutes without increased pain    Time 8   Period Weeks   Status On-going   PT LONG TERM GOAL #5   Title Pt to be able to SLS for 10 seconds to reduce risk of falling    Time 8   Period Weeks   Status Achieved               Plan - 04/16/15 1637    Clinical Impression Statement Treatment session began with joint mobilizations to L tibiofemoral joint in supine to improve knee extension, followed by quad sets and hamstring stretching to further emphasize extension. Pt  required verbal and visual cueing for proper form during hamstring stretching, and she denied any increased pain following therex. Stepping over hurdles was performed on level ground, as pt still has difficulty with completing the exercise without min A from PT to maintain balance. Pt experienced only one episode of LOB during hurdles today.    PT Next Visit Plan Resume TKE's, continue with balance training and manual therapy        Problem List There are no active problems to display for this patient.   Hilma Favors, PT, DPT [phone] 04/16/2015, 4:40 PM  [place] [place] [address] [place], [place], [address] Phone: [phone]   Fax:  [phone]  Name: Carol Gutierrez MRN: [id number] Date of Birth: 1935/09/13

## 2015-04-16 NOTE — Therapy (Signed)
[place], [place], [address] Phone: [phone]   Fax:  [phone]  Occupational Therapy Treatment  Patient Details  Name: Carol Gutierrez MRN: [id number] Date of Birth: 05/22/1935 Referring Provider: Driscilla Moats  Encounter Date: 04/16/2015      OT End of Session - 04/16/15 1410    Visit Number 23   Number of Visits 36   Date for OT Re-Evaluation 05/25/15  mini reassess on 04/23/15   Authorization Type 1-Medicare/Medicare A & B;  2- [employer]   Authorization Time Period Before 27th visit   Authorization - Visit Number 23   Authorization - Number of Visits 27   OT Start Time 1305   OT Stop Time 1349   OT Time Calculation (min) 44 min   Activity Tolerance Patient tolerated treatment well   Behavior During Therapy [facility] for tasks assessed/performed      Past Medical History  Diagnosis Date  . Hypertension   . Arthritis     hands and back    Past Surgical History  Procedure Laterality Date  . Joint replacement  2008    left hip  . Colonoscopy N/A 09/08/2012    Procedure: COLONOSCOPY;  Surgeon: Rogene Houston, MD;  [facility];  Service: Endoscopy;  Laterality: N/A;  730-moved to 830 Ann to notify pt  . Total hip arthroplasty Left Left Hip Replacement    There were no vitals filed for this visit.  Visit Diagnosis:  Stiffness of shoulder joint, left  Muscle weakness of left arm      Subjective Assessment - 04/16/15 1307    Subjective  S:  My arm has been sore, I think it might be my activity level.   Currently in Pain? Yes   Pain Score 3    Pain Location Shoulder   Pain Orientation Left   Pain Descriptors / Indicators Sore   Pain Type Chronic pain            [facility] OT Assessment - 04/16/15 1308    Assessment   Diagnosis left proximal humerus fracture   Precautions   Precautions None                  OT Treatments/Exercises (OP) - 04/16/15 1308    Exercises   Exercises Shoulder   Shoulder Exercises: Supine   Protraction PROM;5 reps;Strengthening;15 reps   Protraction Weight (lbs) 2   Horizontal ABduction PROM;5 reps;Strengthening;15 reps   Horizontal ABduction Weight (lbs) 1   Horizontal ABduction Limitations unweighted arm performed by therapist   External Rotation PROM;5 reps;Strengthening;15 reps   External Rotation Weight (lbs) 2   Internal Rotation PROM;5 reps;Strengthening;15 reps   Internal Rotation Weight (lbs) 2   Flexion PROM;5 reps;Strengthening;15 reps   Shoulder Flexion Weight (lbs) 1   ABduction PROM;5 reps;Strengthening;15 reps   Shoulder ABduction Weight (lbs) 2   Shoulder Exercises: Seated   Protraction AROM;10 reps   Horizontal ABduction AROM;10 reps   External Rotation AROM;10 reps   Internal Rotation AROM;10 reps   Flexion AROM;10 reps   Shoulder Exercises: ROM/Strengthening   UBE (Upper Arm Bike) Level 1 2' forward 2' reverse   Proximal Shoulder Strengthening, Supine 10X without resting   Manual Therapy   Manual Therapy Myofascial release   Manual therapy comments manual therapy performed prior to therex   Myofascial Release Myofascial release to left upper arm, trapezius, and scapularis regions to decrease pain and fascial restrictions and increase joint range of  motion.                 OT Education - 04/16/15 1410    Education provided Yes   Education Details recommended just A/ROM in seated    Person(s) Educated Patient   Methods Explanation   Comprehension Verbalized understanding          OT Short Term Goals - 04/03/15 1210    OT SHORT TERM GOAL #1   Title Pt will be educated on and independent in [place].    Time 3   Period Weeks   OT SHORT TERM GOAL #2   Title Pt will decrease pain to 3/10 in LUE.    Time 3   Period Weeks   OT SHORT TERM GOAL #3   Title Pt will decrease fascial restrictions from max to mod amount in LUE.    Time 3   Period Weeks   OT SHORT TERM GOAL #4   Title Pt will  increase AROM to [facility] to increase ability to donn shirts without compensatory strategies.    Time 3   Period Weeks   Status On-going   OT SHORT TERM GOAL #5   Title Pt will increase strength to 3/5 to increase ability wash hair using BUE.    Time 3   Period Weeks   Status On-going           OT Long Term Goals - 01/26/15 1529    OT LONG TERM GOAL #1   Title Pt will return to prior level of functioning and independence in ADL and leisure tasks.    Time 6   Period Weeks   Status On-going   OT LONG TERM GOAL #2   Title Pt will decrease pain to 1/10 or less during daily tasks.    Time 6   Period Weeks   Status On-going   OT LONG TERM GOAL #3   Title Pt will decrease fascial restrictions from mod to min amounts or less in LUE.    Time 6   Period Weeks   Status On-going   OT LONG TERM GOAL #4   Title Pt will increase AROM to WNL to increase ability to reach into overhead cabinets.    Time 6   Period Weeks   Status On-going   OT LONG TERM GOAL #5   Title Pt will increase strength to 4/5 to increase ability to hold lightweight objects.    Time 6   Period Weeks   Status On-going               Plan - 04/16/15 1411    Clinical Impression Statement A:  Patient had difficulty with strengthening in supine this date with 2#, shifting to 1# for many of the exercises.  Did not use weight in seated - body weight was enough.  Reviewed stargazer stretch.   Plan P:complete strengthening and A/ROM with less assistance.        Problem List There are no active problems to display for this patient.   Vangie Bicker, OTR/L [phone]  04/16/2015, 2:15 PM  [place] [address] [place], [place], [address] Phone: [phone]   Fax:  [phone]  Name: LUJUANA COPENHAVER MRN: [id number] Date of Birth: [birth year]

## 2015-04-18 ENCOUNTER — Ambulatory Visit (HOSPITAL_COMMUNITY): Payer: Medicare Other | Admitting: Physical Therapy

## 2015-04-18 DIAGNOSIS — M25562 Pain in left knee: Secondary | ICD-10-CM | POA: Diagnosis not present

## 2015-04-18 DIAGNOSIS — M629 Disorder of muscle, unspecified: Secondary | ICD-10-CM | POA: Diagnosis not present

## 2015-04-18 DIAGNOSIS — S82142G Displaced bicondylar fracture of left tibia, subsequent encounter for closed fracture with delayed healing: Secondary | ICD-10-CM | POA: Diagnosis not present

## 2015-04-18 DIAGNOSIS — R29898 Other symptoms and signs involving the musculoskeletal system: Secondary | ICD-10-CM

## 2015-04-18 DIAGNOSIS — R269 Unspecified abnormalities of gait and mobility: Secondary | ICD-10-CM

## 2015-04-18 DIAGNOSIS — M25612 Stiffness of left shoulder, not elsewhere classified: Secondary | ICD-10-CM | POA: Diagnosis not present

## 2015-04-18 NOTE — Therapy (Addendum)
[place], [place], [address] Phone: [phone]   Fax:  [phone]  Physical Therapy Treatment  Patient Details  Name: Carol Gutierrez MRN: [id number] Date of Birth: 1935/07/27 No Data Recorded  Encounter Date: 04/18/2015      PT End of Session - 04/18/15 1351    Visit Number 23   Number of Visits 29   Date for PT Re-Evaluation 05/04/15   Authorization Type medicare    Authorization Time Period Authorized 01/25/2015-03/27/2015   Authorization - Visit Number 23   Authorization - Number of Visits 27   PT Start Time 1302   PT Stop Time 1345   PT Time Calculation (min) 43 min   Activity Tolerance Patient tolerated treatment well   Behavior During Therapy [facility] for tasks assessed/performed      Past Medical History  Diagnosis Date  . Hypertension   . Arthritis     hands and back    Past Surgical History  Procedure Laterality Date  . Joint replacement  2008    left hip  . Colonoscopy N/A 09/08/2012    Procedure: COLONOSCOPY;  Surgeon: Rogene Houston, MD;  [facility];  Service: Endoscopy;  Laterality: N/A;  730-moved to 830 Ann to notify pt  . Total hip arthroplasty Left Left Hip Replacement    There were no vitals filed for this visit.  Visit Diagnosis:  Weakness of left leg  Pain in joint involving left lower leg  Abnormal gait      Subjective Assessment - 04/18/15 1305    Subjective Patient reports that she is just feeling sore this morning but OK otherwise    Pertinent History CVA 12/10/2014; HTN , osteroporosis,    Currently in Pain? No/denies   Pain Score 0-No pain                         [facility] Adult PT Treatment/Exercise - 04/18/15 0001    Knee/Hip Exercises: Stretches   Active Hamstring Stretch 3 reps;30 seconds   Active Hamstring Stretch Limitations 12" ste   Gastroc Stretch 3 reps;30 seconds   Gastroc Stretch Limitations slantboard   Knee/Hip  Exercises: Standing   Hip Abduction Both;1 set;15 reps   Abduction Limitations U HHA    Gait Training x266f with no device, focus on mechanics and balance    Knee/Hip Exercises: Supine   Quad Sets 15 reps   Quad Sets Limitations 3 second holds    Short Arc [employer]15 reps;2 sets   Short Arc Quad Sets Limitations 4#    Bridges 15 reps;2 sets     Also perforemd grade 3 AP tib-femoral mobilizations before exercises and balance activities         Balance Exercises - 04/18/15 1320    Balance Exercises: Standing   Tandem Stance Eyes open;Foam/compliant surface;3 reps;15 secs   SLS Eyes open;3 reps;10 secs   Rockerboard Anterior/posterior;Lateral;Other (comment)  no HHA            PT Education - 04/18/15 1351    Education provided No          PT Short Term Goals - 04/04/15 0934    PT SHORT TERM GOAL #1   Title I HEP   Period Weeks   Status Achieved   PT SHORT TERM GOAL #2   Title Pt to be I in ambulation with rolling walker for 200 ft    Time  3   Period Weeks   Status Achieved   PT SHORT TERM GOAL #3   Title Pt to begin advance HEP ( closted chai)   Time 3   Period Weeks   Status Achieved           PT Long Term Goals - 04/04/15 2334    PT LONG TERM GOAL #1   Title Pt to be ambulating with a cane   Time 4   Period Weeks   Status Achieved   PT LONG TERM GOAL #2   Title Pt strength to be at least 4+/5 to be able to go up and down steps in a reciprocal manner.    Time 6   Period Weeks   Status On-going   PT LONG TERM GOAL #3   Title Pt to be walking in her home without an assistive device    Time 6   Period Weeks   Status Partially Met   PT LONG TERM GOAL #4   Title Pt to work out in the yard for 45 minutes without increased pain    Time 8   Period Weeks   Status On-going   PT LONG TERM GOAL #5   Title Pt to be able to SLS for 10 seconds to reduce risk of falling    Time 8   Period Weeks   Status Achieved               Plan -  04/18/15 1352    Clinical Impression Statement Continued with general functional strengthening, manual to L tibiofemoral joint, and balance training today. Patient responded well to manual today, but did have some difficulty with new balance tasks although she was able to complete them correctly with Min guard to Min assist for balance. Also performed hip abductor work to assist in improving balance today. No increase in pain throughout session. Manual performed separately of all other skilled activities today.    Pt will benefit from skilled therapeutic intervention in order to improve on the following deficits Abnormal gait;Decreased activity tolerance;Decreased balance;Decreased strength;Difficulty walking;Pain;Impaired perceived functional ability   Rehab Potential Good   PT Frequency 3x / week   PT Treatment/Interventions ADLs/Self Care Home Management;DME Instruction;Gait training;Stair training;Functional mobility training;Therapeutic activities;Therapeutic exercise;Balance training;Neuromuscular re-education;Patient/family education;Manual techniques   PT Next Visit Plan Resume TKE's, continue with balance training and manual therapy   PT Home Exercise Plan given    Consulted and Agree with Plan of Care Patient        Problem List There are no active problems to display for this patient.   Deniece Ree PT, DPT [place] [address] [place], [place], [address] Phone: [phone]   Fax:  [phone]  Name: Carol Gutierrez MRN: [id number] Date of Birth: 1936-03-09

## 2015-04-19 ENCOUNTER — Ambulatory Visit (HOSPITAL_COMMUNITY): Payer: Medicare Other | Admitting: Occupational Therapy

## 2015-04-19 ENCOUNTER — Encounter (HOSPITAL_COMMUNITY): Payer: Self-pay | Admitting: Occupational Therapy

## 2015-04-19 DIAGNOSIS — S82142G Displaced bicondylar fracture of left tibia, subsequent encounter for closed fracture with delayed healing: Secondary | ICD-10-CM | POA: Diagnosis not present

## 2015-04-19 DIAGNOSIS — M25512 Pain in left shoulder: Secondary | ICD-10-CM

## 2015-04-19 DIAGNOSIS — M25612 Stiffness of left shoulder, not elsewhere classified: Secondary | ICD-10-CM | POA: Diagnosis not present

## 2015-04-19 DIAGNOSIS — M6281 Muscle weakness (generalized): Secondary | ICD-10-CM

## 2015-04-19 DIAGNOSIS — M6289 Other specified disorders of muscle: Secondary | ICD-10-CM

## 2015-04-19 DIAGNOSIS — R269 Unspecified abnormalities of gait and mobility: Secondary | ICD-10-CM | POA: Diagnosis not present

## 2015-04-19 DIAGNOSIS — R29898 Other symptoms and signs involving the musculoskeletal system: Secondary | ICD-10-CM | POA: Diagnosis not present

## 2015-04-19 DIAGNOSIS — M629 Disorder of muscle, unspecified: Secondary | ICD-10-CM | POA: Diagnosis not present

## 2015-04-19 DIAGNOSIS — M25562 Pain in left knee: Secondary | ICD-10-CM | POA: Diagnosis not present

## 2015-04-19 NOTE — Therapy (Signed)
[place], [place], [address] Phone: [phone]   Fax:  [phone]  Occupational Therapy Treatment  Patient Details  Name: Carol Gutierrez MRN: [id number] Date of Birth: August 29, 1935 Referring Provider: Driscilla Moats  Encounter Date: 04/19/2015      OT End of Session - 04/19/15 1515    Visit Number 24   Number of Visits 36   Date for OT Re-Evaluation 05/25/15  mini reassess on 04/23/15   Authorization Type 1-Medicare/Medicare A & B;  2- [employer]   Authorization Time Period Before 27th visit   Authorization - Visit Number 24   Authorization - Number of Visits 27   OT Start Time 1433   OT Stop Time 1522   OT Time Calculation (min) 49 min   Activity Tolerance Patient tolerated treatment well   Behavior During Therapy [facility] for tasks assessed/performed      Past Medical History  Diagnosis Date  . Hypertension   . Arthritis     hands and back    Past Surgical History  Procedure Laterality Date  . Joint replacement  2008    left hip  . Colonoscopy N/A 09/08/2012    Procedure: COLONOSCOPY;  Surgeon: Rogene Houston, MD;  [facility];  Service: Endoscopy;  Laterality: N/A;  730-moved to 830 Ann to notify pt  . Total hip arthroplasty Left Left Hip Replacement    There were no vitals filed for this visit.  Visit Diagnosis:  Stiffness of shoulder joint, left  Muscle weakness of left arm  Tight fascia  Pain in left shoulder      Subjective Assessment - 04/19/15 1436    Subjective  S: I played the dulcimer this morning.    Currently in Pain? No/denies            [facility] OT Assessment - 04/19/15 1435    Assessment   Diagnosis left proximal humerus fracture   Precautions   Precautions None   Precaution Comments Progress as tolerated. Begin strengthening as able. Modalities PRN                  OT Treatments/Exercises (OP) - 04/19/15 1437    Exercises   Exercises Shoulder   Shoulder Exercises: Supine   Protraction PROM;5 reps;Strengthening;15 reps   Protraction Weight (lbs) 2   Horizontal ABduction PROM;5 reps;Strengthening;15 reps   Horizontal ABduction Weight (lbs) 1   External Rotation PROM;5 reps;Strengthening;15 reps   External Rotation Weight (lbs) 1   Internal Rotation PROM;5 reps;Strengthening;15 reps   Internal Rotation Weight (lbs) 1   Flexion PROM;5 reps;Strengthening;15 reps   Shoulder Flexion Weight (lbs) 2   ABduction PROM;5 reps;Strengthening;15 reps   Shoulder ABduction Weight (lbs) 1   Shoulder Exercises: Seated   Protraction AROM;10 reps   Horizontal ABduction AROM;10 reps   External Rotation AROM;10 reps   Internal Rotation AROM;10 reps   Flexion AROM;10 reps   Abduction AROM;10 reps   Shoulder Exercises: ROM/Strengthening   UBE (Upper Arm Bike) Level 1 3' forward 3' reverse   Manual Therapy   Manual Therapy Myofascial release   Manual therapy comments manual therapy performed prior to therex   Myofascial Release Myofascial release to left upper arm, trapezius, and scapularis regions to decrease pain and fascial restrictions and increase joint range of motion.                   OT Short Term Goals - 04/03/15 1210  OT SHORT TERM GOAL #1   Title Pt will be educated on and independent in HEP.    Time 3   Period Weeks   OT SHORT TERM GOAL #2   Title Pt will decrease pain to 3/10 in LUE.    Time 3   Period Weeks   OT SHORT TERM GOAL #3   Title Pt will decrease fascial restrictions from max to mod amount in LUE.    Time 3   Period Weeks   OT SHORT TERM GOAL #4   Title Pt will increase AROM to [facility] to increase ability to donn shirts without compensatory strategies.    Time 3   Period Weeks   Status On-going   OT SHORT TERM GOAL #5   Title Pt will increase strength to 3/5 to increase ability wash hair using BUE.    Time 3   Period Weeks   Status On-going           OT Long Term Goals - 01/26/15 1529    OT  LONG TERM GOAL #1   Title Pt will return to prior level of functioning and independence in ADL and leisure tasks.    Time 6   Period Weeks   Status On-going   OT LONG TERM GOAL #2   Title Pt will decrease pain to 1/10 or less during daily tasks.    Time 6   Period Weeks   Status On-going   OT LONG TERM GOAL #3   Title Pt will decrease fascial restrictions from mod to min amounts or less in LUE.    Time 6   Period Weeks   Status On-going   OT LONG TERM GOAL #4   Title Pt will increase AROM to WNL to increase ability to reach into overhead cabinets.    Time 6   Period Weeks   Status On-going   OT LONG TERM GOAL #5   Title Pt will increase strength to 4/5 to increase ability to hold lightweight objects.    Time 6   Period Weeks   Status On-going               Plan - 04/19/15 1515    Clinical Impression Statement A: Pt completed some strengthening with 2# weight and the remainder with 1# weight, continued A/ROM in sitting. Verbal cuing required for form. Pt reports she continues to attempt her new stretches at home and they are going ok.    Plan P: Continue to work on independence with exercises using correct form/technique. Continue working to increase weight to 2# as pt is able         Problem List There are no active problems to display for this patient.   [name], OTR/L  [phone]  04/19/2015, 4:15 PM  [place] [address] [place], [place], [address] Phone: [phone]   Fax:  [phone]  Name: AUDREY ALONZO MRN: [id number] Date of Birth: February 22, 1936

## 2015-04-20 ENCOUNTER — Ambulatory Visit (HOSPITAL_COMMUNITY): Payer: Medicare Other | Admitting: Physical Therapy

## 2015-04-20 DIAGNOSIS — R269 Unspecified abnormalities of gait and mobility: Secondary | ICD-10-CM | POA: Diagnosis not present

## 2015-04-20 DIAGNOSIS — M25612 Stiffness of left shoulder, not elsewhere classified: Secondary | ICD-10-CM | POA: Diagnosis not present

## 2015-04-20 DIAGNOSIS — R29898 Other symptoms and signs involving the musculoskeletal system: Secondary | ICD-10-CM

## 2015-04-20 DIAGNOSIS — S82142G Displaced bicondylar fracture of left tibia, subsequent encounter for closed fracture with delayed healing: Secondary | ICD-10-CM | POA: Diagnosis not present

## 2015-04-20 DIAGNOSIS — M25562 Pain in left knee: Secondary | ICD-10-CM

## 2015-04-20 DIAGNOSIS — M629 Disorder of muscle, unspecified: Secondary | ICD-10-CM | POA: Diagnosis not present

## 2015-04-20 NOTE — Therapy (Signed)
[place], [place], [address] Phone: [phone]   Fax:  [phone]  Physical Therapy Treatment  Patient Details  Name: Carol Gutierrez MRN: [id number] Date of Birth: 18-Jan-1936 No Data Recorded  Encounter Date: 04/20/2015      PT End of Session - 04/20/15 1512    Visit Number 24   Number of Visits 29   Date for PT Re-Evaluation 05/04/15   Authorization Type medicare    Authorization Time Period Authorized 01/25/2015-03/27/2015   Authorization - Visit Number 24   Authorization - Number of Visits 27   PT Start Time 1300   PT Stop Time 1345   PT Time Calculation (min) 45 min   Activity Tolerance Patient tolerated treatment well   Behavior During Therapy [facility] for tasks assessed/performed      Past Medical History  Diagnosis Date  . Hypertension   . Arthritis     hands and back    Past Surgical History  Procedure Laterality Date  . Joint replacement  2008    left hip  . Colonoscopy N/A 09/08/2012    Procedure: COLONOSCOPY;  Surgeon: Rogene Houston, MD;  [facility];  Service: Endoscopy;  Laterality: N/A;  730-moved to 830 Ann to notify pt  . Total hip arthroplasty Left Left Hip Replacement    There were no vitals filed for this visit.  Visit Diagnosis:  Abnormal gait  Weakness of left leg  Pain in joint involving left lower leg      Subjective Assessment - 04/20/15 1303    Subjective Pt reports that her knee hurts a little bit today, especially since she was just sitting for a while.    Currently in Pain? Yes   Pain Score 3    Pain Location Knee   Pain Orientation Left                         [facility] Adult PT Treatment/Exercise - 04/20/15 0001    Knee/Hip Exercises: Stretches   Active Hamstring Stretch 3 reps;30 seconds   Active Hamstring Stretch Limitations 12" ste   Gastroc Stretch 3 reps;30 seconds   Gastroc Stretch Limitations slantboard   Knee/Hip  Exercises: Standing   Heel Raises 15 reps   Forward Lunges 15 reps   Forward Lunges Limitations 4 inch step   Forward Step Up 15 reps;Step Height: 4"   Forward Step Up Limitations 4" box   Knee/Hip Exercises: Supine   Quad Sets 20 reps   Quad Sets Limitations 3 second holds   Manual Therapy   Manual Therapy Joint mobilization   Manual therapy comments manual therapy performed prior to therex   Joint Mobilization grade III-IV A/P mobilizations of femur on tibia to improve extension             Balance Exercises - 04/20/15 1511    Balance Exercises: Standing   Cone Rotation Foam/compliant surface;Right turn;Left turn   Cone Rotation Limitations on airex pad transferring cones from 14" box and 6" box to COW             PT Short Term Goals - 04/04/15 0934    PT SHORT TERM GOAL #1   Title I HEP   Period Weeks   Status Achieved   PT SHORT TERM GOAL #2   Title Pt to be I in ambulation with rolling walker for 200 ft    Time 3  Period Weeks   Status Achieved   PT SHORT TERM GOAL #3   Title Pt to begin advance HEP ( closted chai)   Time 3   Period Weeks   Status Achieved           PT Long Term Goals - 04/04/15 2330    PT LONG TERM GOAL #1   Title Pt to be ambulating with a cane   Time 4   Period Weeks   Status Achieved   PT LONG TERM GOAL #2   Title Pt strength to be at least 4+/5 to be able to go up and down steps in a reciprocal manner.    Time 6   Period Weeks   Status On-going   PT LONG TERM GOAL #3   Title Pt to be walking in her home without an assistive device    Time 6   Period Weeks   Status Partially Met   PT LONG TERM GOAL #4   Title Pt to work out in the yard for 45 minutes without increased pain    Time 8   Period Weeks   Status On-going   PT LONG TERM GOAL #5   Title Pt to be able to SLS for 10 seconds to reduce risk of falling    Time 8   Period Weeks   Status Achieved               Plan - 04/20/15 1513    Clinical  Impression Statement Continued with manual therapy to improve knee extension, followed by quad sets to improve quad strength and extension ROM. Step ups were added today to improve pt's ability to climb stairs without difficulty, she was able to complete with minimal cueing for technique and no c/o pain. Cone rotations on foam were added to challenge balance while stooping and reaching. Pt required CGA assist to complete the exercise, but did not experience any LOB.    PT Next Visit Plan Resume TKEs, continue with rotational balance activities        Problem List There are no active problems to display for this patient.   Hilma Favors, PT, DPT [phone] 04/20/2015, 3:19 PM  [place], [place], [address] Phone: [phone]   Fax:  [phone]  Name: Carol Gutierrez MRN: [id number] Date of Birth: Aug 03, 1935

## 2015-04-23 ENCOUNTER — Ambulatory Visit (HOSPITAL_COMMUNITY): Payer: Medicare Other | Admitting: Specialist

## 2015-04-23 ENCOUNTER — Ambulatory Visit (HOSPITAL_COMMUNITY): Payer: Medicare Other

## 2015-04-23 DIAGNOSIS — R29898 Other symptoms and signs involving the musculoskeletal system: Secondary | ICD-10-CM | POA: Diagnosis not present

## 2015-04-23 DIAGNOSIS — M25562 Pain in left knee: Secondary | ICD-10-CM | POA: Diagnosis not present

## 2015-04-23 DIAGNOSIS — S82142G Displaced bicondylar fracture of left tibia, subsequent encounter for closed fracture with delayed healing: Secondary | ICD-10-CM | POA: Diagnosis not present

## 2015-04-23 DIAGNOSIS — M25612 Stiffness of left shoulder, not elsewhere classified: Secondary | ICD-10-CM

## 2015-04-23 DIAGNOSIS — M629 Disorder of muscle, unspecified: Secondary | ICD-10-CM

## 2015-04-23 DIAGNOSIS — R269 Unspecified abnormalities of gait and mobility: Secondary | ICD-10-CM | POA: Diagnosis not present

## 2015-04-23 DIAGNOSIS — M25512 Pain in left shoulder: Secondary | ICD-10-CM

## 2015-04-23 DIAGNOSIS — M6281 Muscle weakness (generalized): Secondary | ICD-10-CM

## 2015-04-23 DIAGNOSIS — M6289 Other specified disorders of muscle: Secondary | ICD-10-CM

## 2015-04-23 NOTE — Patient Instructions (Signed)
1. Modified Bridge from existing HEP to include isometric clamshell with red TB.

## 2015-04-23 NOTE — Therapy (Signed)
[place], [place], [address] Phone: [phone]   Fax:  [phone]  Physical Therapy Treatment  Patient Details  Name: Carol Gutierrez MRN: [id number] Date of Birth: [birth year] No Data Recorded  Encounter Date: 04/23/2015      PT End of Session - 04/23/15 1408    Visit Number 25   Number of Visits 29   Date for PT Re-Evaluation 05/04/15   Authorization Type medicare    Authorization Time Period Authorized 01/25/2015-03/27/2015   Authorization - Visit Number 25   Authorization - Number of Visits 27   PT Start Time 1350   PT Stop Time 1428   PT Time Calculation (min) 38 min   Equipment Utilized During Treatment Gait belt   Activity Tolerance Patient tolerated treatment well   Behavior During Therapy WFL for tasks assessed/performed      Past Medical History  Diagnosis Date  . Hypertension   . Arthritis     hands and back    Past Surgical History  Procedure Laterality Date  . Joint replacement  2008    left hip  . Colonoscopy N/A 09/08/2012    Procedure: COLONOSCOPY;  Surgeon: Rogene Houston, MD;  [facility];  Service: Endoscopy;  Laterality: N/A;  730-moved to 830 Ann to notify pt  . Total hip arthroplasty Left Left Hip Replacement    There were no vitals filed for this visit.  Visit Diagnosis:  Abnormal gait  Weakness of left leg  Pain in joint involving left lower leg  Tibial plateau fracture, left, closed, with delayed healing, subsequent encounter  Tight fascia      Subjective Assessment - 04/23/15 1352    Subjective Pt is doing well today. Some pain with performing stairs at home (L knee), which resolves after cesation of stairs.    Currently in Pain? No/denies                         [facility] Adult PT Treatment/Exercise - 04/23/15 1358    Knee/Hip Exercises: Stretches   Active Hamstring Stretch 3 reps;30 seconds   Active Hamstring Stretch  Limitations 8" ste   Gastroc Stretch 3 reps;30 seconds;Both   Gastroc Stretch Limitations 2" step   Knee/Hip Exercises: Standing   Heel Raises 20 reps  starting in dorsiflexion   Heel Raises Limitations Dorsiflexion: starting in PF 2x15   Forward Lunges 15 reps   Forward Lunges Limitations 4 inch step   Hip Abduction --   Abduction Limitations --   Forward Step Up --   Forward Step Up Limitations --   Knee/Hip Exercises: Seated   Long Arc Quad 2 sets;10 reps  5#   Marching Limitations 1x10 bilat   5# weight   Knee/Hip Exercises: Supine   Bridges 15 reps;1 set  c RedTB; L knee in greater flexion to target L glute.    Manual Therapy   Joint Mobilization grade III-IV A/P mobilizations of femur on tibia to improve extension  anterior glides, postrior glides, and tibial distraction.              Balance Exercises - 04/23/15 1418    Balance Exercises: Standing   SLS Eyes open;Solid surface;3 reps;30 secs           PT Education - 04/23/15 1544    Education provided Yes   Education Details explained how weak left glute is contributing to knee pain with stairs.  Person(s) Educated Patient   Methods Explanation   Comprehension Verbalized understanding          PT Short Term Goals - 04/04/15 0934    PT SHORT TERM GOAL #1   Title I HEP   Period Weeks   Status Achieved   PT SHORT TERM GOAL #2   Title Pt to be I in ambulation with rolling walker for 200 ft    Time 3   Period Weeks   Status Achieved   PT SHORT TERM GOAL #3   Title Pt to begin advance HEP ( closted chai)   Time 3   Period Weeks   Status Achieved           PT Long Term Goals - 04/04/15 8786    PT LONG TERM GOAL #1   Title Pt to be ambulating with a cane   Time 4   Period Weeks   Status Achieved   PT LONG TERM GOAL #2   Title Pt strength to be at least 4+/5 to be able to go up and down steps in a reciprocal manner.    Time 6   Period Weeks   Status On-going   PT LONG TERM GOAL #3    Title Pt to be walking in her home without an assistive device    Time 6   Period Weeks   Status Partially Met   PT LONG TERM GOAL #4   Title Pt to work out in the yard for 45 minutes without increased pain    Time 8   Period Weeks   Status On-going   PT LONG TERM GOAL #5   Title Pt to be able to SLS for 10 seconds to reduce risk of falling    Time 8   Period Weeks   Status Achieved               Plan - 04/23/15 1545    Clinical Impression Statement Pt feeling better today, tolerating whole session without worse pain. Focused on retraining balance exercises, and targeting of Left glute weakness qhich contnues to change gait and SLS mechancis in stance, as patient cannot maintain L hip external rotation in L SLS or  while walking or stepping up. Deferref step ups at this time due to time.    Pt will benefit from skilled therapeutic intervention in order to improve on the following deficits Abnormal gait;Decreased activity tolerance;Decreased balance;Decreased strength;Difficulty walking;Pain;Impaired perceived functional ability   Rehab Potential Good   PT Frequency 3x / week   PT Treatment/Interventions ADLs/Self Care Home Management;DME Instruction;Gait training;Stair training;Functional mobility training;Therapeutic activities;Therapeutic exercise;Balance training;Neuromuscular re-education;Patient/family education;Manual techniques   PT Next Visit Plan Continue with anterior tibial gildes on L to improve extension ROM; Hamstring bridges to facilitate L knee extension; resume step-ups with L hip slightly externallly rotated.    PT Home Exercise Plan Pt will add Tband calmshells to bridging.    Consulted and Agree with Plan of Care Patient        Problem List There are no active problems to display for this patient.   Carol Gutierrez C 04/23/2015, 3:52 PM  3:53 PM  Carol Gutierrez, PT, DPT [place] License # [id number]       [facility] [address] [place], [place], [address] Phone: [phone]   Fax:  [phone]  Name: Carol Gutierrez MRN: [id number] Date of Birth: [birth year]

## 2015-04-23 NOTE — Therapy (Signed)
[place], [place], [address] Phone: [phone]   Fax:  [phone]  Occupational Therapy Treatment  Patient Details  Name: Carol Gutierrez MRN: [id number] Date of Birth: 06-30-1935 Referring Provider: Driscilla Moats  Encounter Date: 04/23/2015      OT End of Session - 04/23/15 1413    Visit Number 25   Number of Visits 36   Date for OT Re-Evaluation 05/25/15   Authorization Type 1-Medicare/Medicare A & B;  2- [employer]   Authorization Time Period before 35th visit   Authorization - Visit Number 25   Authorization - Number of Visits 35   OT Start Time 7341   OT Stop Time 1347   OT Time Calculation (min) 42 min   Activity Tolerance Patient tolerated treatment well   Behavior During Therapy [facility] for tasks assessed/performed      Past Medical History  Diagnosis Date  . Hypertension   . Arthritis     hands and back    Past Surgical History  Procedure Laterality Date  . Joint replacement  2008    left hip  . Colonoscopy N/A 09/08/2012    Procedure: COLONOSCOPY;  Surgeon: Rogene Houston, MD;  [facility];  Service: Endoscopy;  Laterality: N/A;  730-moved to 830 Ann to notify pt  . Total hip arthroplasty Left Left Hip Replacement    There were no vitals filed for this visit.  Visit Diagnosis:  Stiffness of shoulder joint, left  Muscle weakness of left arm  Pain in left shoulder      Subjective Assessment - 04/23/15 1306    Subjective  S:  Everything is easier and I still want to be able to do things more like normal, like reaching up overhead all the way vs half way.   Currently in Pain? No/denies            [facility] OT Assessment - 04/23/15 0001    Assessment   Diagnosis left proximal humerus fracture   Precautions   Precautions None   Precaution Comments Progress as tolerated. Begin strengthening as able. Modalities PRN   AROM   Overall AROM Comments Assessed in supine, er/IR  adducted (03/26/15)  seated AROM assessed this date for the first time:  flexion 95, abduction 65, internal rotation 90, external rotation 32   Left Shoulder Flexion 140 Degrees  133   Left Shoulder ABduction 165 Degrees   Left Shoulder Internal Rotation 90 Degrees   Left Shoulder External Rotation 40 Degrees   PROM   Overall PROM Comments Assessed in supine, er/IR adducted   Left Shoulder Flexion 160 Degrees   Left Shoulder ABduction 175 Degrees   Left Shoulder Internal Rotation 90 Degrees   Left Shoulder External Rotation 70 Degrees   Strength   Overall Strength Comments Assessed seated. IR/er adducted   Left Shoulder Flexion 3+/5  3-/5   Left Shoulder ABduction 3+/5  3-/5   Left Shoulder Internal Rotation 3+/5  3-/5   Left Shoulder External Rotation 3+/5  3-/5                  OT Treatments/Exercises (OP) - 04/23/15 0001    Shoulder Exercises: Supine   Protraction PROM;5 reps   Horizontal ABduction PROM;5 reps   External Rotation PROM;5 reps   Internal Rotation PROM;5 reps   Flexion PROM;5 reps   ABduction PROM;5 reps   Shoulder Exercises: Seated   Protraction AAROM;10 reps   Horizontal ABduction AAROM;10  reps   External Rotation AAROM;10 reps   Internal Rotation AAROM;10 reps   Flexion AAROM;10 reps   Abduction AAROM;10 reps   Shoulder Exercises: ROM/Strengthening   UBE (Upper Arm Bike) Level 1 3' forward 3' reverse   Other ROM/Strengthening Exercises pvc slide into flexion with torso twist at end range    Manual Therapy   Manual Therapy Myofascial release   Manual therapy comments manual therapy performed prior to therex   Myofascial Release Myofascial release to left upper arm, trapezius, and scapularis regions to decrease pain and fascial restrictions and increase joint range of motion.                   OT Short Term Goals - 04/23/15 1323    OT SHORT TERM GOAL #1   Title Pt will be educated on and independent in HEP.    Time 3   Period  Weeks   Status Achieved   OT SHORT TERM GOAL #2   Title Pt will decrease pain to 3/10 in LUE.    Time 3   Period Weeks   Status Achieved   OT SHORT TERM GOAL #3   Title Pt will decrease fascial restrictions from max to mod amount in LUE.    Time 3   Period Weeks   Status Achieved   OT SHORT TERM GOAL #4   Title Pt will increase AROM to [employer] to increase ability to donn shirts without compensatory strategies.    Time 3   Period Weeks   Status On-going   OT SHORT TERM GOAL #5   Title Pt will increase strength to 3/5 to increase ability wash hair using BUE.    Time 3   Period Weeks   Status Achieved           OT Long Term Goals - 04/23/15 1324    OT LONG TERM GOAL #1   Title Pt will return to prior level of functioning and independence in ADL and leisure tasks.    Time 6   Period Weeks   Status On-going   OT LONG TERM GOAL #2   Title Pt will decrease pain to 1/10 or less during daily tasks.    Time 6   Period Weeks   Status On-going   OT LONG TERM GOAL #3   Title Pt will decrease fascial restrictions from mod to min amounts or less in LUE.    Time 6   Period Weeks   Status On-going   OT LONG TERM GOAL #4   Title Pt will increase AROM to WNL to increase ability to reach into overhead cabinets and lift her hair and tease it while styling.   Time 6   Period Weeks   Status On-going   OT LONG TERM GOAL #5   Title Pt will increase strength to 4/5 to increase ability to hold lightweight objects and pick up mattress while making bed.   Time 6   Period Weeks   Status On-going               Plan - 04/23/15 1414    Clinical Impression Statement A:  Patient is making steady progress towards her goals in occoupational therapy.  She has gained A/ROM and P/ROM in supine and strength is improving.  4/5 short term goals met and progressing towards all other goals.    Pt will benefit from skilled therapeutic intervention in order to improve on the following deficits  (Retired) Pain;Decreased strength;Impaired  UE functional use;Increased fascial restricitons;Decreased range of motion;Impaired flexibility   OT Frequency 2x / week   OT Duration 4 weeks   OT Treatment/Interventions Self-care/ADL training;Passive range of motion;Patient/family education;Cryotherapy;Electrical Stimulation;Therapeutic exercise;Moist Heat;Manual Therapy;Therapeutic activities   Plan P:  Focus on A/ROM at shoulder height and above and improve functional reaching with left arm          G-Codes - May 14, 2015 1416    Functional Assessment Tool Used clinical judgement   Functional Limitation Carrying, moving and handling objects   Carrying, Moving and Handling Objects Current Status (M4268) At least 40 percent but less than 60 percent impaired, limited or restricted   Carrying, Moving and Handling Objects Goal Status (T4196) At least 20 percent but less than 40 percent impaired, limited or restricted      Problem List There are no active problems to display for this patient.   Vangie Bicker, OTR/L [phone]  2015-05-14, 2:18 PM  [place], [place], [address] Phone: [phone]   Fax:  [phone]  Name: Carol Gutierrez MRN: [id number] Date of Birth: [birth year]  Occupational Therapy Progress Note  Dates of Reporting Period: 03/24/15 to May 14, 2015 Objective Reports of Subjective Statement: see above  Objective Measurements: see above  Goal Update: see above  Plan: see above Reason Skilled Services are Required: skilled manual therapy to improve p/rom and a/rom.  Therapist provided cuing to improve functional use of left arm at shoulder height and above.  Vangie Bicker, OTR/L [phone]

## 2015-04-25 ENCOUNTER — Ambulatory Visit (HOSPITAL_COMMUNITY): Payer: Medicare Other | Admitting: Physical Therapy

## 2015-04-25 DIAGNOSIS — R29898 Other symptoms and signs involving the musculoskeletal system: Secondary | ICD-10-CM | POA: Diagnosis not present

## 2015-04-25 DIAGNOSIS — M629 Disorder of muscle, unspecified: Secondary | ICD-10-CM | POA: Diagnosis not present

## 2015-04-25 DIAGNOSIS — M25562 Pain in left knee: Secondary | ICD-10-CM

## 2015-04-25 DIAGNOSIS — S82142G Displaced bicondylar fracture of left tibia, subsequent encounter for closed fracture with delayed healing: Secondary | ICD-10-CM | POA: Diagnosis not present

## 2015-04-25 DIAGNOSIS — M25612 Stiffness of left shoulder, not elsewhere classified: Secondary | ICD-10-CM | POA: Diagnosis not present

## 2015-04-25 DIAGNOSIS — R269 Unspecified abnormalities of gait and mobility: Secondary | ICD-10-CM

## 2015-04-25 DIAGNOSIS — M6289 Other specified disorders of muscle: Secondary | ICD-10-CM

## 2015-04-25 NOTE — Therapy (Signed)
[place], [place], [address] Phone: [phone]   Fax:  [phone]  Physical Therapy Treatment  Patient Details  Name: DYNASTI KERMAN MRN: [id number] Date of Birth: 21-Nov-1935 No Data Recorded  Encounter Date: 04/25/2015      PT End of Session - 04/25/15 1350    Visit Number 26   Number of Visits 29   Date for PT Re-Evaluation 05/04/15   Authorization Type medicare    Authorization - Visit Number 26   Authorization - Number of Visits 27   PT Start Time 1324  patient in bathroom at beginning of session    PT Stop Time 1345   PT Time Calculation (min) 38 min   Activity Tolerance Patient tolerated treatment well   Behavior During Therapy [facility] for tasks assessed/performed      Past Medical History  Diagnosis Date  . Hypertension   . Arthritis     hands and back    Past Surgical History  Procedure Laterality Date  . Joint replacement  2008    left hip  . Colonoscopy N/A 09/08/2012    Procedure: COLONOSCOPY;  Surgeon: Rogene Houston, MD;  [facility];  Service: Endoscopy;  Laterality: N/A;  730-moved to 830 Ann to notify pt  . Total hip arthroplasty Left Left Hip Replacement    There were no vitals filed for this visit.  Visit Diagnosis:  Abnormal gait  Weakness of left leg  Pain in joint involving left lower leg  Tibial plateau fracture, left, closed, with delayed healing, subsequent encounter  Tight fascia      Subjective Assessment - 04/25/15 1308    Subjective Patient reports she is doing well today, no complaints    Pertinent History CVA 12/10/2014; HTN , osteroporosis,    Currently in Pain? No/denies                         [facility] Adult PT Treatment/Exercise - 04/25/15 0001    Knee/Hip Exercises: Stretches   Active Hamstring Stretch 3 reps;30 seconds   Active Hamstring Stretch Limitations stairs    Gastroc Stretch 3 reps;30 seconds;Both   Gastroc  Stretch Limitations slantboard    Knee/Hip Exercises: Standing   Forward Lunges 15 reps   Forward Lunges Limitations 4 inch box, hip ER    Functional Squat 1 set;10 reps   Functional Squat Limitations cues for form    Rocker Board Limitations x20AP and lateral intermittent U HHA    Other Standing Knee Exercises TKEs 1x15    Other Standing Knee Exercises sit to stands 1x15 with L LE staggered backwards    Knee/Hip Exercises: Supine   Quad Sets 20 reps   Quad Sets Limitations 3 second hold    Bridges 20 reps   Bridges Limitations L LE closer to rear to target glut    Manual Therapy   Joint Mobilization grade III-IV A/P mobilizations of femur on tibia to improve extension             Balance Exercises - 04/25/15 1349    Balance Exercises: Standing   Standing Eyes Closed Narrow base of support (BOS);2 reps;20 secs;Foam/compliant surface   Tandem Stance Eyes closed;Foam/compliant surface;3 reps;10 secs           PT Education - 04/25/15 1354    Education provided No          PT Short Term Goals - 04/04/15 4010  PT SHORT TERM GOAL #1   Title I HEP   Period Weeks   Status Achieved   PT SHORT TERM GOAL #2   Title Pt to be I in ambulation with rolling walker for 200 ft    Time 3   Period Weeks   Status Achieved   PT SHORT TERM GOAL #3   Title Pt to begin advance HEP ( closted chai)   Time 3   Period Weeks   Status Achieved           PT Long Term Goals - 04/04/15 4163    PT LONG TERM GOAL #1   Title Pt to be ambulating with a cane   Time 4   Period Weeks   Status Achieved   PT LONG TERM GOAL #2   Title Pt strength to be at least 4+/5 to be able to go up and down steps in a reciprocal manner.    Time 6   Period Weeks   Status On-going   PT LONG TERM GOAL #3   Title Pt to be walking in her home without an assistive device    Time 6   Period Weeks   Status Partially Met   PT LONG TERM GOAL #4   Title Pt to work out in the yard for 45 minutes without  increased pain    Time 8   Period Weeks   Status On-going   PT LONG TERM GOAL #5   Title Pt to be able to SLS for 10 seconds to reduce risk of falling    Time 8   Period Weeks   Status Achieved               Plan - 04/25/15 1351    Clinical Impression Statement Focused on functional stretches and exercises today with focus on glut activation and strengthening especially on L; modified majority of exercises to target this muscle group today. Patient did state that she had fatigue during today's exercises partially due to exercise techniques. Continue to note gait deviations due to weakness and knee rom limitations.    Pt will benefit from skilled therapeutic intervention in order to improve on the following deficits Abnormal gait;Decreased activity tolerance;Decreased balance;Decreased strength;Difficulty walking;Pain;Impaired perceived functional ability   Rehab Potential Good   PT Frequency 3x / week   PT Treatment/Interventions ADLs/Self Care Home Management;DME Instruction;Gait training;Stair training;Functional mobility training;Therapeutic activities;Therapeutic exercise;Balance training;Neuromuscular re-education;Patient/family education;Manual techniques   PT Next Visit Plan G-code due. Continue with anterior tibial gildes on L to improve extension ROM; Hamstring bridges to facilitate L knee extension; resume step-ups with L hip slightly externallly rotated.    PT Home Exercise Plan Pt will add Tband calmshells to bridging.    Consulted and Agree with Plan of Care Patient        Problem List There are no active problems to display for this patient.   Deniece Ree PT, DPT [place] [address] [place], [place], [address] Phone: [phone]   Fax:  [phone]  Name: SHAYE ELLING MRN: [id number] Date of Birth: 06-23-1935

## 2015-04-26 ENCOUNTER — Ambulatory Visit (HOSPITAL_COMMUNITY): Payer: Medicare Other | Admitting: Physical Therapy

## 2015-04-26 DIAGNOSIS — R29898 Other symptoms and signs involving the musculoskeletal system: Secondary | ICD-10-CM | POA: Diagnosis not present

## 2015-04-26 DIAGNOSIS — R269 Unspecified abnormalities of gait and mobility: Secondary | ICD-10-CM | POA: Diagnosis not present

## 2015-04-26 DIAGNOSIS — M25612 Stiffness of left shoulder, not elsewhere classified: Secondary | ICD-10-CM | POA: Diagnosis not present

## 2015-04-26 DIAGNOSIS — M810 Age-related osteoporosis without current pathological fracture: Secondary | ICD-10-CM | POA: Diagnosis not present

## 2015-04-26 DIAGNOSIS — E785 Hyperlipidemia, unspecified: Secondary | ICD-10-CM | POA: Diagnosis not present

## 2015-04-26 DIAGNOSIS — S82142G Displaced bicondylar fracture of left tibia, subsequent encounter for closed fracture with delayed healing: Secondary | ICD-10-CM

## 2015-04-26 DIAGNOSIS — M629 Disorder of muscle, unspecified: Secondary | ICD-10-CM

## 2015-04-26 DIAGNOSIS — M25562 Pain in left knee: Secondary | ICD-10-CM | POA: Diagnosis not present

## 2015-04-26 DIAGNOSIS — Z6825 Body mass index (BMI) 25.0-25.9, adult: Secondary | ICD-10-CM | POA: Diagnosis not present

## 2015-04-26 DIAGNOSIS — I10 Essential (primary) hypertension: Secondary | ICD-10-CM | POA: Diagnosis not present

## 2015-04-26 DIAGNOSIS — M6289 Other specified disorders of muscle: Secondary | ICD-10-CM

## 2015-04-26 NOTE — Therapy (Signed)
Carol Gutierrez, [place], [address] Phone: [phone]   Fax:  [phone]  Physical Therapy Treatment  Patient Details  Name: Carol Gutierrez MRN: [id number] Date of Birth: Apr 24, 1936 No Data Recorded  Encounter Date: 04/26/2015      PT End of Session - 04/26/15 1627    Visit Number 27   Number of Visits 38   Date for PT Re-Evaluation 05/26/15   Authorization Type medicare    Authorization - Visit Number 27   Authorization - Number of Visits 37   PT Start Time 6962   PT Stop Time 1435   PT Time Calculation (min) 50 min   Equipment Utilized During Treatment Gait belt   Activity Tolerance Patient tolerated treatment well      Past Medical History  Diagnosis Date  . Hypertension   . Arthritis     hands and back    Past Surgical History  Procedure Laterality Date  . Joint replacement  2008    left hip  . Colonoscopy N/A 09/08/2012    Procedure: COLONOSCOPY;  Surgeon: Rogene Houston, MD;  [facility];  Service: Endoscopy;  Laterality: N/A;  730-moved to 830 Ann to notify pt  . Total hip arthroplasty Left Left Hip Replacement    There were no vitals filed for this visit.  Visit Diagnosis:  Abnormal gait  Weakness of left leg  Pain in joint involving left lower leg  Tibial plateau fracture, left, closed, with delayed healing, subsequent encounter  Tight fascia      Subjective Assessment - 04/26/15 1344    Subjective Pt is going without her cane all the time at home.  She takes the cane when she goes out.     Pertinent History CVA 12/10/2014; HTN , osteroporosis,    How long can you sit comfortably? no problem;   How long can you stand comfortably? able to stand for 45 minutes now was 20 minutes 3 weeks ago   How long can you walk comfortably? able to walk unlimited with a cane now was 30 minutes    Currently in Pain? Yes   Pain Score 5    Pain Location Shoulder   Pain Orientation Left             [facility] PT Assessment - 04/26/15 0001    Precautions   Precautions None   Precaution Comments Progress as tolerated. Begin strengthening as able. Modalities PRN   Observation/Other Assessments   Focus on Therapeutic Outcomes (FOTO)  56   Single Leg Stance   Comments Rt was 15 continues to be 15; Lt was 26 now 10   Sit to Stand   Comments 5 sit to stand in 11.62 was 13.05 November 30   AROM   Overall AROM Comments Assessed in supine, er/IR adducted (03/26/15)  seated AROM assessed this date for the first time:  flexion 95, abduction 65, internal rotation 90, external rotation 32   Left Knee Extension 8  was 10 PROM 4   PROM   Overall PROM Comments Assessed in supine, er/IR adducted   Strength   Overall Strength Comments Assessed seated. IR/er adducted   Left Shoulder External Rotation --  3-/5   Left Hip Flexion 5/5   Left Hip Extension 4-/5  was 4-/5   Left Hip ABduction 3+/5   Left Hip ADduction 5/5   Left Knee Flexion 5/5   Left Knee Extension 5/5  was 4-/5  Left Ankle Dorsiflexion 4+/5  ws 4/5    6 Minute Walk- Baseline   6 Minute Walk- Baseline --  936 ft                      [facility] Adult PT Treatment/Exercise - 04/26/15 0001    Knee/Hip Exercises: Seated   Sit to Sand 15 reps  slow    Knee/Hip Exercises: Supine   Bridges 20 reps   Bridges Limitations on ball    Knee/Hip Exercises: Sidelying   Hip ABduction Strengthening;Left;20 reps   Hip ABduction Limitations 4#             Balance Exercises - 04/25/15 1349    Balance Exercises: Standing   Standing Eyes Closed Narrow base of support (BOS);2 reps;20 secs;Foam/compliant surface   Tandem Stance Eyes closed;Foam/compliant surface;3 reps;10 secs           PT Education - 04/26/15 1626    Education provided Yes   Education Details issued new HEP working on balance and gluteus maximus and medius   Person(s) Educated Patient   Methods Explanation   Comprehension Verbalized  understanding          PT Short Term Goals - 04/26/15 1411    PT SHORT TERM GOAL #1   Title I HEP   Time 2   Period Weeks   Status Achieved   PT SHORT TERM GOAL #2   Title Pt to be I in ambulation with rolling walker for 200 ft    Time 3   Period Weeks   Status Achieved   PT SHORT TERM GOAL #3   Title Pt to begin advance HEP ( closted chai)   Time 3   Period Weeks   Status Achieved           PT Long Term Goals - 04/26/15 1412    PT LONG TERM GOAL #1   Title Pt to be ambulating with a cane   Time 4   Period Weeks   Status Achieved   PT LONG TERM GOAL #2   Title Pt strength to be at least 4+/5 to be able to go up and down steps in a reciprocal manner.    Baseline Pt strength is not at 4+/5 with all mm but is able to go up and down steps in a reciprocal manner.    Time 6   Period Weeks   Status Achieved   PT LONG TERM GOAL #3   Time 6   Period Weeks   Status Achieved   PT LONG TERM GOAL #4   Title Pt to work out in the yard for 45 minutes without increased pain    Time 8   Period Weeks   Status --  Pt has not worked in her yard   PT [place] #5   Title Pt to be able to SLS for 10 seconds to reduce risk of falling    Time 8   Period Weeks   Status Partially Met  able to complete when she is not tired                Plan - 04/26/15 1628    Clinical Impression Statement Pt reassessed with noted improvement in all aspects except for gluteal medius and maximus as well as balance.  These deficits are prohibiting pt from being able to walk without a cane.  If pt ambulates longer than a minute mm fatigue and she begins  to have a trendelenburg gait.  Pt was uged to work on the exercises at home that will target weakened mm to achieve pt goal of being able to ambulate without her cane.     Pt will benefit from skilled therapeutic intervention in order to improve on the following deficits Abnormal gait;Decreased activity tolerance;Decreased  balance;Decreased strength;Difficulty walking;Pain;Impaired perceived functional ability   Rehab Potential Good   PT Frequency 2x / week   PT Duration 4 weeks  additional times   PT Treatment/Interventions ADLs/Self Care Home Management;DME Instruction;Gait training;Stair training;Functional mobility training;Therapeutic activities;Therapeutic exercise;Balance training;Neuromuscular re-education;Patient/family education;Manual techniques   PT Next Visit Plan step ups with higher step height, deep lunges with return, PROM for improved knee extension, isometric hip adduction against the wall          G-Codes - 05/23/15 1633    Functional Assessment Tool Used foto   Functional Limitation Mobility: Walking and moving around   Mobility: Walking and Moving Around Current Status [phone]) At least 40 percent but less than 60 percent impaired, limited or restricted   Mobility: Walking and Moving Around Goal Status [phone]) At least 20 percent but less than 40 percent impaired, limited or restricted      Problem List There are no active problems to display for this patient.   Rayetta Humphrey, PT CLT [phone] 05/23/2015, 4:37 PM  [place], [place], [address] Phone: [phone]   Fax:  [phone]  Name: ASHAUNTI TREPTOW MRN: [id number] Date of Birth: Sep 16, 1935  Physical Therapy Progress Note  Dates of Reporting Period: 11/20  to 05-23-23  Objective Reports of Subjective Statement: I'm continuing to improve its just slower  Objective Measurements: see above  Goal Update: see above  Plan: decrease to 2 x a week for 4 weeks   Reason Skilled Services are Required: Pt desires to ambulate without a cane,  Balance and strength deficits are preventing this

## 2015-04-26 NOTE — Patient Instructions (Signed)
Strengthening: Hip Abduction (Side-Lying)    Tighten muscles on front of left thigh, then lift leg __6_ inches from surface, keeping knee locked.  Repeat _20___ times per set. Do __1__ sets per session. Do __2__ sessions per day. Use 4# http://orth.exer.us/622   Copyright  VHI. All rights reserved.  Strengthening: Hip Extension (Prone)    Tighten muscles on front of left thigh, then lift leg _3___ inches from surface, keeping knee locked. Repeat 10____ times per set. Do _1___ sets per session. Do __3__ sessions per day.  http://orth.exer.us/620   Balance: Unilateral    Attempt to balance on left leg, eyes open. Hold __10-20__ seconds. Repeat __4__ times per set. Do _1___ sets per session. Do ____3 sessions per day. Perform exercise with eyes closed.  http://orth.exer.us/28   Copyright  VHI. All rights reserved.

## 2015-04-27 ENCOUNTER — Encounter (HOSPITAL_COMMUNITY): Payer: Medicare Other | Admitting: Occupational Therapy

## 2015-05-01 ENCOUNTER — Ambulatory Visit (HOSPITAL_COMMUNITY): Payer: Medicare Other | Admitting: Occupational Therapy

## 2015-05-01 ENCOUNTER — Encounter (HOSPITAL_COMMUNITY): Payer: Self-pay | Admitting: Occupational Therapy

## 2015-05-01 ENCOUNTER — Ambulatory Visit (HOSPITAL_COMMUNITY): Payer: Medicare Other

## 2015-05-01 DIAGNOSIS — M25612 Stiffness of left shoulder, not elsewhere classified: Secondary | ICD-10-CM

## 2015-05-01 DIAGNOSIS — M6281 Muscle weakness (generalized): Secondary | ICD-10-CM

## 2015-05-01 DIAGNOSIS — M629 Disorder of muscle, unspecified: Secondary | ICD-10-CM

## 2015-05-01 DIAGNOSIS — R29898 Other symptoms and signs involving the musculoskeletal system: Secondary | ICD-10-CM

## 2015-05-01 DIAGNOSIS — S82142G Displaced bicondylar fracture of left tibia, subsequent encounter for closed fracture with delayed healing: Secondary | ICD-10-CM | POA: Diagnosis not present

## 2015-05-01 DIAGNOSIS — R269 Unspecified abnormalities of gait and mobility: Secondary | ICD-10-CM | POA: Diagnosis not present

## 2015-05-01 DIAGNOSIS — M25512 Pain in left shoulder: Secondary | ICD-10-CM

## 2015-05-01 DIAGNOSIS — M6289 Other specified disorders of muscle: Secondary | ICD-10-CM

## 2015-05-01 DIAGNOSIS — S42202P Unspecified fracture of upper end of left humerus, subsequent encounter for fracture with malunion: Secondary | ICD-10-CM

## 2015-05-01 DIAGNOSIS — M25562 Pain in left knee: Secondary | ICD-10-CM | POA: Diagnosis not present

## 2015-05-01 NOTE — Therapy (Signed)
[place], [place], [address] Phone: [phone]   Fax:  [phone]  Occupational Therapy Treatment  Patient Details  Name: Carol Gutierrez MRN: [id number] Date of Birth: [birth year] Referring Provider: Driscilla Moats  Encounter Date: 05/01/2015      OT End of Session - 05/01/15 1600    Visit Number 26   Number of Visits 36   Date for OT Re-Evaluation 05/25/15   Authorization Type 1-Medicare/Medicare A & B;  2- [employer]   Authorization Time Period before 35th visit   Authorization - Visit Number 26   Authorization - Number of Visits 35   OT Start Time [id number]   OT Stop Time 1516   OT Time Calculation (min) 43 min   Activity Tolerance Patient tolerated treatment well   Behavior During Therapy [facility] for tasks assessed/performed      Past Medical History  Diagnosis Date  . Hypertension   . Arthritis     hands and back    Past Surgical History  Procedure Laterality Date  . Joint replacement  2008    left hip  . Colonoscopy N/A 09/08/2012    Procedure: COLONOSCOPY;  Surgeon: Rogene Houston, MD;  [facility];  Service: Endoscopy;  Laterality: N/A;  730-moved to 830 Ann to notify pt  . Total hip arthroplasty Left Left Hip Replacement    There were no vitals filed for this visit.  Visit Diagnosis:  Tight fascia  Stiffness of shoulder joint, left  Muscle weakness of left arm  Pain in left shoulder      Subjective Assessment - 05/01/15 1433    Subjective  S: I can do pretty well with 2 pounds except when going out to the side.    Currently in Pain? Yes   Pain Score 1    Pain Location Shoulder   Pain Orientation Left   Pain Descriptors / Indicators Sore   Pain Type Chronic pain            [facility] OT Assessment - 05/01/15 1500    Assessment   Diagnosis left proximal humerus fracture   Precautions   Precautions None   Precaution Comments Progress as tolerated. Begin strengthening as  able. Modalities PRN                  OT Treatments/Exercises (OP) - 05/01/15 1435    Exercises   Exercises Shoulder   Shoulder Exercises: Supine   Protraction PROM;5 reps;Strengthening;10 reps   Protraction Weight (lbs) 2   Horizontal ABduction PROM;5 reps;Strengthening;10 reps   Horizontal ABduction Weight (lbs) 1   External Rotation PROM;5 reps;Strengthening;10 reps   External Rotation Weight (lbs) 1   Internal Rotation PROM;5 reps;Strengthening;10 reps   Internal Rotation Weight (lbs) 1   Flexion PROM;5 reps;Strengthening;10 reps   Shoulder Flexion Weight (lbs) 2   ABduction PROM;5 reps;Strengthening;10 reps   Shoulder Exercises: Seated   Protraction AROM;10 reps   Horizontal ABduction AAROM;10 reps   External Rotation AAROM;10 reps   Internal Rotation AAROM;10 reps   Flexion AAROM;10 reps   Abduction AAROM;10 reps   Shoulder Exercises: ROM/Strengthening   Other ROM/Strengthening Exercises pvc slide into flexion with torso twist at end range, 10 reps    Manual Therapy   Manual Therapy Myofascial release   Manual therapy comments manual therapy performed prior to therex   Myofascial Release Myofascial release to left upper arm, trapezius, and scapularis regions to decrease pain and fascial restrictions and  increase joint range of motion.                   OT Short Term Goals - 04/23/15 1323    OT SHORT TERM GOAL #1   Title Pt will be educated on and independent in HEP.    Time 3   Period Weeks   Status Achieved   OT SHORT TERM GOAL #2   Title Pt will decrease pain to 3/10 in LUE.    Time 3   Period Weeks   Status Achieved   OT SHORT TERM GOAL #3   Title Pt will decrease fascial restrictions from max to mod amount in LUE.    Time 3   Period Weeks   Status Achieved   OT SHORT TERM GOAL #4   Title Pt will increase AROM to [facility] to increase ability to donn shirts without compensatory strategies.    Time 3   Period Weeks   Status On-going   OT  SHORT TERM GOAL #5   Title Pt will increase strength to 3/5 to increase ability wash hair using BUE.    Time 3   Period Weeks   Status Achieved           OT Long Term Goals - 04/23/15 1324    OT LONG TERM GOAL #1   Title Pt will return to prior level of functioning and independence in ADL and leisure tasks.    Time 6   Period Weeks   Status On-going   OT LONG TERM GOAL #2   Title Pt will decrease pain to 1/10 or less during daily tasks.    Time 6   Period Weeks   Status On-going   OT LONG TERM GOAL #3   Title Pt will decrease fascial restrictions from mod to min amounts or less in LUE.    Time 6   Period Weeks   Status On-going   OT LONG TERM GOAL #4   Title Pt will increase AROM to WNL to increase ability to reach into overhead cabinets and lift her hair and tease it while styling.   Time 6   Period Weeks   Status On-going   OT LONG TERM GOAL #5   Title Pt will increase strength to 4/5 to increase ability to hold lightweight objects and pick up mattress while making bed.   Time 6   Period Weeks   Status On-going               Plan - 05/01/15 1601    Clinical Impression Statement A: Completed strengthening exercises in supine, pt able to achieve greater range during A/ROM and strengthening than P/ROM due to difficulty relaxing arm during passive stretching. Completed A/ROM and AA/ROM in sitting, pt able to achieve greater range with AA/ROM.    Plan P: work towards greater A/ROM in sitting, complete pinch tree to work on functional A/ROM uisng LUE.         Problem List There are no active problems to display for this patient.   [name], OTR/L  [phone]  05/01/2015, 4:03 PM  [facility] [address] [place], [place], [address] Phone: [phone]   Fax:  [phone]  Name: Carol Gutierrez MRN: [id number] Date of Birth: 1935-12-05

## 2015-05-01 NOTE — Therapy (Signed)
[place], [place], [address] Phone: [phone]   Fax:  [phone]  Physical Therapy Treatment  Patient Details  Name: Carol Gutierrez MRN: [id number] Date of Birth: [birth year] No Data Recorded  Encounter Date: 05/01/2015      PT End of Session - 05/01/15 1429    Visit Number 28   Number of Visits 42   Date for PT Re-Evaluation 05/26/15   Authorization Type medicare    Authorization Time Period Authorized 01/25/2015-03/27/2015   Authorization - Visit Number 28   Authorization - Number of Visits 37   PT Start Time 9833   PT Stop Time 1432   PT Time Calculation (min) 40 min   Activity Tolerance Patient tolerated treatment well   Behavior During Therapy [facility] for tasks assessed/performed      Past Medical History  Diagnosis Date  . Hypertension   . Arthritis     hands and back    Past Surgical History  Procedure Laterality Date  . Joint replacement  2008    left hip  . Colonoscopy N/A 09/08/2012    Procedure: COLONOSCOPY;  Surgeon: Rogene Houston, MD;  [facility];  Service: Endoscopy;  Laterality: N/A;  730-moved to 830 Ann to notify pt  . Total hip arthroplasty Left Left Hip Replacement    There were no vitals filed for this visit.  Visit Diagnosis:  Abnormal gait  Weakness of left leg  Pain in joint involving left lower leg  Tibial plateau fracture, left, closed, with delayed healing, subsequent encounter  Tight fascia  Stiffness of shoulder joint, left  Muscle weakness of left arm  Pain in left shoulder  Proximal humerus fracture, left, with malunion, subsequent encounter          [facility] Adult PT Treatment/Exercise - 05/01/15 0001    Knee/Hip Exercises: Standing   Forward Lunges Both;15 reps   Forward Lunges Limitations on floor   Side Lunges Both;15 reps   Side Lunges Limitations on floor   Hip Abduction Both;15 reps  Isometric 5" holds   Lateral Step Up  Both;15 reps;Hand Hold: 1;Step Height: 6"   Forward Step Up 15 reps;Hand Hold: 1;Step Height: 6";Both   Other Standing Knee Exercises sit to stands 1x15 with L LE staggered backwards    Knee/Hip Exercises: Seated   Sit to Sand 15 reps;without UE support  slow   Knee/Hip Exercises: Supine   Bridges 20 reps   Bridges Limitations on ball    Knee Extension PROM;3 sets   Knee Extension Limitations 5 minutes 4#; PROM 3 x 20"   Knee/Hip Exercises: Sidelying   Hip ABduction Strengthening;Left;20 reps   Hip ABduction Limitations 4#            PT Short Term Goals - 04/26/15 1411    PT SHORT TERM GOAL #1   Title I HEP   Time 2   Period Weeks   Status Achieved   PT SHORT TERM GOAL #2   Title Pt to be I in ambulation with rolling walker for 200 ft    Time 3   Period Weeks   Status Achieved   PT SHORT TERM GOAL #3   Title Pt to begin advance HEP ( closted chai)   Time 3   Period Weeks   Status Achieved           PT Long Term Goals - 04/26/15 1412    PT LONG TERM GOAL #1  Title Pt to be ambulating with a cane   Time 4   Period Weeks   Status Achieved   PT LONG TERM GOAL #2   Title Pt strength to be at least 4+/5 to be able to go up and down steps in a reciprocal manner.    Baseline Pt strength is not at 4+/5 with all mm but is able to go up and down steps in a reciprocal manner.    Time 6   Period Weeks   Status Achieved   PT LONG TERM GOAL #3   Time 6   Period Weeks   Status Achieved   PT LONG TERM GOAL #4   Title Pt to work out in the yard for 45 minutes without increased pain    Time 8   Period Weeks   Status --  Pt has not worked in her yard   PT [place] #5   Title Pt to be able to SLS for 10 seconds to reduce risk of falling    Time 8   Period Weeks   Status Partially Met  able to complete when she is not tired                Plan - 05/01/15 1431    Clinical Impression Statement Session focus on improving functional strengthening  primarily gluteal musculature.  Progressed challange with exercises and added new isometric gluteal medius strengthening exercises with minimal cueing for form and technique.  Pt entered dept ambulating with no AD, minimal cueing to improve gait mechanics to equalize stance phase and full extension with Lt knee.  Pt tolerated well with prolonged PROM with 4# x 5 minutes and manual PROM.  No reports of increased pain through session.     PT Next Visit Plan Continue with step ups with higher step height, deep lunges with return, PROM for improved knee extension, isometric hip abduction against the wall        Problem List There are no active problems to display for this patient.  [address] LPTA; [place]  Aldona Lento 05/01/2015, 3:45 PM  [place], [place], [address] Phone: [phone]   Fax:  [phone]  Name: Carol Gutierrez MRN: [id number] Date of Birth: 1935/10/09

## 2015-05-02 ENCOUNTER — Ambulatory Visit (HOSPITAL_COMMUNITY): Payer: Medicare Other

## 2015-05-02 ENCOUNTER — Ambulatory Visit (HOSPITAL_COMMUNITY): Payer: Medicare Other | Admitting: Physical Therapy

## 2015-05-02 ENCOUNTER — Encounter (HOSPITAL_COMMUNITY): Payer: Self-pay

## 2015-05-02 DIAGNOSIS — M25612 Stiffness of left shoulder, not elsewhere classified: Secondary | ICD-10-CM | POA: Diagnosis not present

## 2015-05-02 DIAGNOSIS — M6289 Other specified disorders of muscle: Secondary | ICD-10-CM

## 2015-05-02 DIAGNOSIS — R29898 Other symptoms and signs involving the musculoskeletal system: Secondary | ICD-10-CM

## 2015-05-02 DIAGNOSIS — M6281 Muscle weakness (generalized): Secondary | ICD-10-CM

## 2015-05-02 DIAGNOSIS — S82142G Displaced bicondylar fracture of left tibia, subsequent encounter for closed fracture with delayed healing: Secondary | ICD-10-CM | POA: Diagnosis not present

## 2015-05-02 DIAGNOSIS — M25562 Pain in left knee: Secondary | ICD-10-CM | POA: Diagnosis not present

## 2015-05-02 DIAGNOSIS — M629 Disorder of muscle, unspecified: Secondary | ICD-10-CM

## 2015-05-02 DIAGNOSIS — R269 Unspecified abnormalities of gait and mobility: Secondary | ICD-10-CM | POA: Diagnosis not present

## 2015-05-02 NOTE — Therapy (Signed)
[facility] [place], [place], [address] Phone: [phone]   Fax:  [phone]  Physical Therapy Treatment  Patient Details  Name: Carol Gutierrez MRN: [id number] Date of Birth: 07-Jan-1936 No Data Recorded  Encounter Date: 05/02/2015      PT End of Session - 05/02/15 1548    Visit Number 29   Number of Visits 38   Date for PT Re-Evaluation 05/26/15   Authorization Type medicare    Authorization Time Period Authorized 01/25/2015-03/27/2015   Authorization - Visit Number 11   Authorization - Number of Visits 58   PT Start Time 2778   PT Stop Time 1430  computer error at front desk- wrong person check in and this patient was not checked in; could not extend time due to arrival of next patient    PT Time Calculation (min) 35 min   Activity Tolerance Patient tolerated treatment well   Behavior During Therapy [facility] for tasks assessed/performed      Past Medical History  Diagnosis Date  . Hypertension   . Arthritis     hands and back    Past Surgical History  Procedure Laterality Date  . Joint replacement  2008    left hip  . Colonoscopy N/A 09/08/2012    Procedure: COLONOSCOPY;  Surgeon: Rogene Houston, MD;  [facility];  Service: Endoscopy;  Laterality: N/A;  730-moved to 830 Ann to notify pt  . Total hip arthroplasty Left Left Hip Replacement    There were no vitals filed for this visit.  Visit Diagnosis:  Abnormal gait  Weakness of left leg  Pain in joint involving left lower leg  Tibial plateau fracture, left, closed, with delayed healing, subsequent encounter      Subjective Assessment - 05/02/15 1357    Subjective Patient arrives very pleasant and doing very well today    Pertinent History CVA 12/10/2014; HTN , osteroporosis,    Currently in Pain? No/denies                         [facility] Adult PT Treatment/Exercise - 05/02/15 0001    Knee/Hip Exercises: Stretches   Active Hamstring Stretch 3 reps;30 seconds   Active Hamstring Stretch Limitations 12 inch box    Gastroc Stretch 3 reps;30 seconds;Both   Gastroc Stretch Limitations slantboard    Knee/Hip Exercises: Aerobic   Elliptical 1.5 minutes on elliptical with supervision   very fatigued    Knee/Hip Exercises: Standing   Forward Lunges Both;15 reps   Forward Lunges Limitations on floor   Side Lunges Both;15 reps   Side Lunges Limitations on floor   Lateral Step Up Both;1 set;15 reps   Lateral Step Up Limitations 6 inch step    Forward Step Up Both;1 set;15 reps   Forward Step Up Limitations 6 inch box    Functional Squat --   Functional Squat Limitations --   Rocker Board Limitations x20AP and lateral intermittent U HHA    Other Standing Knee Exercises 3D hip excursions 1x10                PT Education - 05/02/15 1547    Education provided Yes   Education Details education on getting onto and off of elliptical safely    Person(s) Educated Patient   Methods Explanation   Comprehension Verbalized understanding;Returned demonstration;Need further instruction          PT Short Term Goals -  04/26/15 1411    PT SHORT TERM GOAL #1   Title I HEP   Time 2   Period Weeks   Status Achieved   PT SHORT TERM GOAL #2   Title Pt to be I in ambulation with rolling walker for 200 ft    Time 3   Period Weeks   Status Achieved   PT SHORT TERM GOAL #3   Title Pt to begin advance HEP ( closted chai)   Time 3   Period Weeks   Status Achieved           PT Long Term Goals - 04/26/15 1412    PT LONG TERM GOAL #1   Title Pt to be ambulating with a cane   Time 4   Period Weeks   Status Achieved   PT LONG TERM GOAL #2   Title Pt strength to be at least 4+/5 to be able to go up and down steps in a reciprocal manner.    Baseline Pt strength is not at 4+/5 with all mm but is able to go up and down steps in a reciprocal manner.    Time 6   Period Weeks   Status Achieved   PT LONG  TERM GOAL #3   Time 6   Period Weeks   Status Achieved   PT LONG TERM GOAL #4   Title Pt to work out in the yard for 45 minutes without increased pain    Time 8   Period Weeks   Status --  Pt has not worked in her yard   PT [place] #5   Title Pt to be able to SLS for 10 seconds to reduce risk of falling    Time 8   Period Weeks   Status Partially Met  able to complete when she is not tired                Plan - 05/02/15 1549    Clinical Impression Statement Focused on functional strength today wtih good performance by patient hwoever she did fatigue very quickly during exercise. Also re-introduced elliptical work today with cues and education regarding safe mount/dismount however patient only able to work elliptical for 90 seconds before becoming very fatigued and needing to stop. Recommend more safely training mounting/dismounting elliptical  moving forward so patient can begin wroking on her machine at home to address poor activity tolerance.    Pt will benefit from skilled therapeutic intervention in order to improve on the following deficits Abnormal gait;Decreased activity tolerance;Decreased balance;Decreased strength;Difficulty walking;Pain;Impaired perceived functional ability   Rehab Potential Good   PT Frequency 2x / week   PT Duration 4 weeks   PT Treatment/Interventions ADLs/Self Care Home Management;DME Instruction;Gait training;Stair training;Functional mobility training;Therapeutic activities;Therapeutic exercise;Balance training;Neuromuscular re-education;Patient/family education;Manual techniques   PT Next Visit Plan Continue with step ups with higher step height, deep lunges with return, PROM for improved knee extension, isometric hip abduction against the wall. Re-address mount/dismount elliptical.    PT Home Exercise Plan Pt will add Tband calmshells to bridging.    Consulted and Agree with Plan of Care Patient        Problem List There are no  active problems to display for this patient.   Deniece Ree PT, DPT [place] [address] [place], [place], [address] Phone: [phone]   Fax:  [phone]  Name: Carol Gutierrez MRN: [id number] Date of Birth: 1935/10/01

## 2015-05-02 NOTE — Therapy (Signed)
[place], [place], [address] Phone: [phone]   Fax:  [phone]  Occupational Therapy Treatment  Patient Details  Name: Carol Gutierrez MRN: [id number] Date of Birth: Sep 08, 1935 Referring Provider: Driscilla Moats  Encounter Date: 05/02/2015      OT End of Session - 05/02/15 1559    Visit Number 27   Number of Visits 36   Date for OT Re-Evaluation 05/25/15   Authorization Type 1-Medicare/Medicare A & B;  2- [employer]   Authorization Time Period before 35th visit   Authorization - Visit Number 27   Authorization - Number of Visits 35   OT Start Time 1430   OT Stop Time 1515   OT Time Calculation (min) 45 min   Activity Tolerance Patient tolerated treatment well   Behavior During Therapy [facility] for tasks assessed/performed      Past Medical History  Diagnosis Date  . Hypertension   . Arthritis     hands and back    Past Surgical History  Procedure Laterality Date  . Joint replacement  2008    left hip  . Colonoscopy N/A 09/08/2012    Procedure: COLONOSCOPY;  Surgeon: Rogene Houston, MD;  [facility];  Service: Endoscopy;  Laterality: N/A;  730-moved to 830 Ann to notify pt  . Total hip arthroplasty Left Left Hip Replacement    There were no vitals filed for this visit.  Visit Diagnosis:  Stiffness of shoulder joint, left  Muscle weakness of left arm  Tight fascia      Subjective Assessment - 05/02/15 1454    Subjective  S: My arm was really sore yesterday.    Currently in Pain? No/denies            [facility] OT Assessment - 05/02/15 1454    Assessment   Diagnosis left proximal humerus fracture   Precautions   Precautions None   Precaution Comments Progress as tolerated. Begin strengthening as able. Modalities PRN                  OT Treatments/Exercises (OP) - 05/02/15 1445    Exercises   Exercises Shoulder   Shoulder Exercises: Supine   Protraction PROM;5  reps;Strengthening;10 reps   Protraction Weight (lbs) 2   Horizontal ABduction PROM;5 reps;Strengthening;10 reps   Horizontal ABduction Weight (lbs) 2   External Rotation Strengthening;10 reps  P/ROM not completed due to increased pain when stretching   External Rotation Weight (lbs) 2   Internal Rotation Strengthening;10 reps  P/ROM not completed due to increased when stretching   Internal Rotation Weight (lbs) 2   Flexion PROM;5 reps;Strengthening;10 reps   Shoulder Flexion Weight (lbs) 2   Shoulder Exercises: Seated   Protraction AROM;10 reps   Horizontal ABduction AROM;10 reps   External Rotation AROM;10 reps   Internal Rotation AROM;10 reps   Flexion AROM;10 reps   Abduction AROM;10 reps   Shoulder Exercises: Standing   Extension Theraband;12 reps   Theraband Level (Shoulder Extension) Level 2 (Red)   Row Theraband;12 reps   Theraband Level (Shoulder Row) Level 2 (Red)   Retraction Theraband;12 reps   Theraband Level (Shoulder Retraction) Level 2 (Red)   Shoulder Exercises: ROM/Strengthening   Proximal Shoulder Strengthening, Supine 10X with 2#   Proximal Shoulder Strengthening, Seated 10X with no rest breaks                  OT Short Term Goals - 05/02/15 1601  OT SHORT TERM GOAL #1   Title Pt will be educated on and independent in HEP.    Time 3   Period Weeks   OT SHORT TERM GOAL #2   Title Pt will decrease pain to 3/10 in LUE.    Time 3   Period Weeks   OT SHORT TERM GOAL #3   Title Pt will decrease fascial restrictions from max to mod amount in LUE.    Time 3   Period Weeks   OT SHORT TERM GOAL #4   Title Pt will increase AROM to [facility] to increase ability to donn shirts without compensatory strategies.    Time 3   Period Weeks   Status On-going   OT SHORT TERM GOAL #5   Title Pt will increase strength to 3/5 to increase ability wash hair using BUE.    Time 3   Period Weeks           OT Long Term Goals - 04/23/15 1324    OT LONG TERM  GOAL #1   Title Pt will return to prior level of functioning and independence in ADL and leisure tasks.    Time 6   Period Weeks   Status On-going   OT LONG TERM GOAL #2   Title Pt will decrease pain to 1/10 or less during daily tasks.    Time 6   Period Weeks   Status On-going   OT LONG TERM GOAL #3   Title Pt will decrease fascial restrictions from mod to min amounts or less in LUE.    Time 6   Period Weeks   Status On-going   OT LONG TERM GOAL #4   Title Pt will increase AROM to WNL to increase ability to reach into overhead cabinets and lift her hair and tease it while styling.   Time 6   Period Weeks   Status On-going   OT LONG TERM GOAL #5   Title Pt will increase strength to 4/5 to increase ability to hold lightweight objects and pick up mattress while making bed.   Time 6   Period Weeks   Status On-going               Plan - 05/02/15 1559    Clinical Impression Statement A: Completed all supine exercises with 2# weight and seated exercises with A/ROM. Focus on keeping proper form with range available.    Plan P: Cont to work on increasing  A/ROM when sitting. Add ball on the wall and overhead lacing.         Problem List There are no active problems to display for this patient.   Ailene Ravel, OTR/L,CBIS  [phone]  05/02/2015, 4:02 PM  [place] [address] [place], [place], [address] Phone: [phone]   Fax:  [phone]  Name: Carol Gutierrez MRN: [id number] Date of Birth: 09/16/1935

## 2015-05-04 ENCOUNTER — Ambulatory Visit (HOSPITAL_COMMUNITY): Payer: Medicare Other

## 2015-05-04 ENCOUNTER — Ambulatory Visit (HOSPITAL_COMMUNITY): Payer: Medicare Other | Admitting: Occupational Therapy

## 2015-05-04 ENCOUNTER — Other Ambulatory Visit (HOSPITAL_COMMUNITY): Payer: Self-pay | Admitting: Internal Medicine

## 2015-05-04 DIAGNOSIS — M25612 Stiffness of left shoulder, not elsewhere classified: Secondary | ICD-10-CM | POA: Diagnosis not present

## 2015-05-04 DIAGNOSIS — S42202P Unspecified fracture of upper end of left humerus, subsequent encounter for fracture with malunion: Secondary | ICD-10-CM

## 2015-05-04 DIAGNOSIS — R29898 Other symptoms and signs involving the musculoskeletal system: Secondary | ICD-10-CM | POA: Diagnosis not present

## 2015-05-04 DIAGNOSIS — Z1231 Encounter for screening mammogram for malignant neoplasm of breast: Secondary | ICD-10-CM

## 2015-05-04 DIAGNOSIS — R269 Unspecified abnormalities of gait and mobility: Secondary | ICD-10-CM

## 2015-05-04 DIAGNOSIS — M25562 Pain in left knee: Secondary | ICD-10-CM

## 2015-05-04 DIAGNOSIS — M25512 Pain in left shoulder: Secondary | ICD-10-CM

## 2015-05-04 DIAGNOSIS — M629 Disorder of muscle, unspecified: Secondary | ICD-10-CM

## 2015-05-04 DIAGNOSIS — M6289 Other specified disorders of muscle: Secondary | ICD-10-CM

## 2015-05-04 DIAGNOSIS — M6281 Muscle weakness (generalized): Secondary | ICD-10-CM

## 2015-05-04 DIAGNOSIS — S82142G Displaced bicondylar fracture of left tibia, subsequent encounter for closed fracture with delayed healing: Secondary | ICD-10-CM | POA: Diagnosis not present

## 2015-05-04 NOTE — Therapy (Signed)
Carol Gutierrez, [place], [address] Phone: [phone]   Fax:  [phone]  Physical Therapy Treatment  Patient Details  Name: Carol Gutierrez MRN: [id number] Date of Birth: 1936-03-07 No Data Recorded  Encounter Date: 05/04/2015      PT End of Session - 05/04/15 1519    Visit Number 30   Number of Visits 38   Date for PT Re-Evaluation 05/26/15   Authorization Type medicare    Authorization Time Period Authorized 01/25/2015-03/27/2015   Authorization - Visit Number 29   Authorization - Number of Visits 37   PT Start Time 1518   PT Stop Time 1603   PT Time Calculation (min) 45 min   Equipment Utilized During Treatment Gait belt   Activity Tolerance Patient tolerated treatment well   Behavior During Therapy [facility] for tasks assessed/performed      Past Medical History  Diagnosis Date  . Hypertension   . Arthritis     hands and back    Past Surgical History  Procedure Laterality Date  . Joint replacement  2008    left hip  . Colonoscopy N/A 09/08/2012    Procedure: COLONOSCOPY;  Surgeon: Rogene Houston, MD;  [facility];  Service: Endoscopy;  Laterality: N/A;  730-moved to 830 Ann to notify pt  . Total hip arthroplasty Left Left Hip Replacement    There were no vitals filed for this visit.  Visit Diagnosis:  Abnormal gait  Weakness of left leg  Pain in joint involving left lower leg  Tibial plateau fracture, left, closed, with delayed healing, subsequent encounter  Pain in left shoulder  Proximal humerus fracture, left, with malunion, subsequent encounter      Subjective Assessment - 05/04/15 1518    Subjective PT stated she was feeling good today, no reports of pain.     Pertinent History CVA 12/10/2014; HTN , osteroporosis,    Patient Stated Goals to be able to walk again    Currently in Pain? No/denies              [facility] Adult PT Treatment/Exercise - 05/04/15 1520     Knee/Hip Exercises: Aerobic   Elliptical 3' with supervision getting on and off   Knee/Hip Exercises: Standing   Forward Lunges Both;15 reps   Forward Lunges Limitations on floor   Side Lunges Both;15 reps   Side Lunges Limitations on floor   Hip Abduction Both;15 reps  isometric   Lateral Step Up Both;15 reps;Step Height: 6";Hand Hold: 1   Forward Step Up Both;15 reps;Step Height: 6";Right;Hand Hold: 0;Left;Hand Hold: 1   Rocker Board 2 minutes   Rocker Board Limitations R/L and A/P 1 HHA   Other Standing Knee Exercises 3D hip excursions 1x10   Other Standing Knee Exercises Sidestepping with GTB 3RT                                                                                                               PT Short Term Goals -  04/26/15 1411    PT SHORT TERM GOAL #1   Title I HEP   Time 2   Period Weeks   Status Achieved   PT SHORT TERM GOAL #2   Title Pt to be I in ambulation with rolling walker for 200 ft    Time 3   Period Weeks   Status Achieved   PT SHORT TERM GOAL #3   Title Pt to begin advance HEP ( closted chai)   Time 3   Period Weeks   Status Achieved           PT Long Term Goals - 04/26/15 1412    PT LONG TERM GOAL #1   Title Pt to be ambulating with a cane   Time 4   Period Weeks   Status Achieved   PT LONG TERM GOAL #2   Title Pt strength to be at least 4+/5 to be able to go up and down steps in a reciprocal manner.    Baseline Pt strength is not at 4+/5 with all mm but is able to go up and down steps in a reciprocal manner.    Time 6   Period Weeks   Status Achieved   PT LONG TERM GOAL #3   Time 6   Period Weeks   Status Achieved   PT LONG TERM GOAL #4   Title Pt to work out in the yard for 45 minutes without increased pain    Time 8   Period Weeks   Status --  Pt has not worked in her yard   PT [place] #5   Title Pt to be able to SLS for 10 seconds to reduce risk of falling    Time 8   Period Weeks    Status Partially Met  able to complete when she is not tired                Plan - 05/04/15 1652    Clinical Impression Statement Session focus on functional strengthening, balance training and activity tolerance.  Pt progressing well with ability to add resistnace with abduction walking and improved ability to increase time to 3' on elliptical though was limited by fatigue following required seat rest break.  Reviewed safety mount/dismounting elliptical so pt. can begin working on her machine at home.  Encouraged pt to wait until she feels more confidenct and safer before beginning at home.   PT Next Visit Plan Continue with step ups with higher step height, deep lunges with return, PROM for improved knee extension, isometric hip abduction against the wall. Re-address mount/dismount elliptical.         Problem List There are no active problems to display for this patient.  [address] LPTA; [place]  Carol Gutierrez 05/04/2015, [place] [address] [place], [place], [address] Phone: [phone]   Fax:  [phone]  Name: Carol Gutierrez MRN: [id number] Date of Birth: 08-17-1935

## 2015-05-04 NOTE — Therapy (Signed)
[place] Gut[place], [place], [address] Phone: [phone]   Fax:  [phone]  Occupational Therapy Treatment  Patient Details  Name: Carol Gutierrez MRN: [id number] Date of Birth: [birth year] Referring Provider: Driscilla Moats  Encounter Date: 05/04/2015      OT End of Session - 05/04/15 1512    Visit Number 28   Number of Visits 36   Date for OT Re-Evaluation 05/25/15   Authorization Type 1-Medicare/Medicare A & B;  2- [employer]   Authorization Time Period before 35th visit   Authorization - Visit Number 28   Authorization - Number of Visits 35   OT Start Time 1432   OT Stop Time 1516   OT Time Calculation (min) 44 min   Activity Tolerance Patient tolerated treatment well   Behavior During Therapy [facility] for tasks assessed/performed      Past Medical History  Diagnosis Date  . Hypertension   . Arthritis     hands and back    Past Surgical History  Procedure Laterality Date  . Joint replacement  2008    left hip  . Colonoscopy N/A 09/08/2012    Procedure: COLONOSCOPY;  Surgeon: Rogene Houston, MD;  [facility];  Service: Endoscopy;  Laterality: N/A;  730-moved to 830 Ann to notify pt  . Total hip arthroplasty Left Left Hip Replacement    There were no vitals filed for this visit.  Visit Diagnosis:  Stiffness of shoulder joint, left  Muscle weakness of left arm  Tight fascia      Subjective Assessment - 05/04/15 1432    Subjective  S: I did my exercises this morning and am feeling good.    Currently in Pain? No/denies                      OT Treatments/Exercises (OP) - 05/04/15 1435    Exercises   Exercises Shoulder   Shoulder Exercises: Supine   Protraction PROM;5 reps;Strengthening;10 reps   Protraction Weight (lbs) 2   Horizontal ABduction PROM;5 reps;Strengthening;10 reps   Horizontal ABduction Weight (lbs) 2   External Rotation PROM;5 reps;Strengthening;10 reps   External Rotation Weight (lbs) 2   Internal Rotation PROM;5 reps;Strengthening;10 reps   Internal Rotation Weight (lbs) 2   Flexion PROM;5 reps;Strengthening;10 reps   Shoulder Flexion Weight (lbs) 2   Shoulder Exercises: Seated   Protraction AROM;10 reps   Horizontal ABduction AROM;10 reps   External Rotation AROM;10 reps   Internal Rotation AROM;10 reps   Flexion AROM;10 reps   Abduction AROM;10 reps   Shoulder Exercises: ROM/Strengthening   UBE (Upper Arm Bike) Level 1 3' forward 3' reverse   Over Head Lace 1'   Ball on Wall 1' flexion   Manual Therapy   Manual Therapy Myofascial release   Manual therapy comments manual therapy performed prior to therex   Myofascial Release Myofascial release to left upper arm, trapezius, and scapularis regions to decrease pain and fascial restrictions and increase joint range of motion.                   OT Short Term Goals - 05/02/15 1601    OT SHORT TERM GOAL #1   Title Pt will be educated on and independent in HEP.    Time 3   Period Weeks   OT SHORT TERM GOAL #2   Title Pt will decrease pain to 3/10 in LUE.    Time 3  Period Weeks   OT SHORT TERM GOAL #3   Title Pt will decrease fascial restrictions from max to mod amount in LUE.    Time 3   Period Weeks   OT SHORT TERM GOAL #4   Title Pt will increase AROM to [facility] to increase ability to donn shirts without compensatory strategies.    Time 3   Period Weeks   Status On-going   OT SHORT TERM GOAL #5   Title Pt will increase strength to 3/5 to increase ability wash hair using BUE.    Time 3   Period Weeks           OT Long Term Goals - 04/23/15 1324    OT LONG TERM GOAL #1   Title Pt will return to prior level of functioning and independence in ADL and leisure tasks.    Time 6   Period Weeks   Status On-going   OT LONG TERM GOAL #2   Title Pt will decrease pain to 1/10 or less during daily tasks.    Time 6   Period Weeks   Status On-going   OT LONG TERM  GOAL #3   Title Pt will decrease fascial restrictions from mod to min amounts or less in LUE.    Time 6   Period Weeks   Status On-going   OT LONG TERM GOAL #4   Title Pt will increase AROM to WNL to increase ability to reach into overhead cabinets and lift her hair and tease it while styling.   Time 6   Period Weeks   Status On-going   OT LONG TERM GOAL #5   Title Pt will increase strength to 4/5 to increase ability to hold lightweight objects and pick up mattress while making bed.   Time 6   Period Weeks   Status On-going               Plan - 05/04/15 1512    Clinical Impression Statement A: Added ball on wall, resumed overhead lacing and UBE. Pt completed supine and seated exercises with min cuing for form. No complaints of pain during session, minimal fatigue reported at end of session.    Plan P: Cont to work on increasing A/ROM during seated exercises, resume scapular theraband exercises.         Problem List There are no active problems to display for this patient.   [name], OTR/L  [phone]  05/04/2015, 4:13 PM  [place] [address] [place], [place], [address] Phone: [phone]   Fax:  [phone]  Name: Carol Gutierrez MRN: [id number] Date of Birth: [birth year]

## 2015-05-09 ENCOUNTER — Ambulatory Visit (HOSPITAL_COMMUNITY)
Admission: RE | Admit: 2015-05-09 | Discharge: 2015-05-09 | Disposition: A | Discharge status: Home / Self Care | Payer: Medicare Other | Source: Ambulatory Visit | Attending: Internal Medicine | Admitting: Internal Medicine

## 2015-05-09 ENCOUNTER — Ambulatory Visit (HOSPITAL_COMMUNITY): Payer: Medicare Other

## 2015-05-09 DIAGNOSIS — Z1231 Encounter for screening mammogram for malignant neoplasm of breast: Secondary | ICD-10-CM | POA: Insufficient documentation

## 2015-05-10 DIAGNOSIS — H40013 Open angle with borderline findings, low risk, bilateral: Secondary | ICD-10-CM | POA: Diagnosis not present

## 2015-05-10 DIAGNOSIS — H35033 Hypertensive retinopathy, bilateral: Secondary | ICD-10-CM | POA: Diagnosis not present

## 2015-05-10 DIAGNOSIS — H26492 Other secondary cataract, left eye: Secondary | ICD-10-CM | POA: Diagnosis not present

## 2015-05-10 DIAGNOSIS — Z961 Presence of intraocular lens: Secondary | ICD-10-CM | POA: Diagnosis not present

## 2015-05-11 ENCOUNTER — Ambulatory Visit (HOSPITAL_COMMUNITY): Payer: Medicare Other | Attending: General Practice

## 2015-05-11 DIAGNOSIS — R29898 Other symptoms and signs involving the musculoskeletal system: Secondary | ICD-10-CM | POA: Diagnosis not present

## 2015-05-11 DIAGNOSIS — M6281 Muscle weakness (generalized): Secondary | ICD-10-CM

## 2015-05-11 DIAGNOSIS — S82142G Displaced bicondylar fracture of left tibia, subsequent encounter for closed fracture with delayed healing: Secondary | ICD-10-CM | POA: Diagnosis not present

## 2015-05-11 DIAGNOSIS — S42202P Unspecified fracture of upper end of left humerus, subsequent encounter for fracture with malunion: Secondary | ICD-10-CM | POA: Diagnosis not present

## 2015-05-11 DIAGNOSIS — M25512 Pain in left shoulder: Secondary | ICD-10-CM | POA: Diagnosis not present

## 2015-05-11 DIAGNOSIS — M25562 Pain in left knee: Secondary | ICD-10-CM | POA: Diagnosis not present

## 2015-05-11 DIAGNOSIS — M25612 Stiffness of left shoulder, not elsewhere classified: Secondary | ICD-10-CM

## 2015-05-11 DIAGNOSIS — M629 Disorder of muscle, unspecified: Secondary | ICD-10-CM | POA: Insufficient documentation

## 2015-05-11 DIAGNOSIS — R269 Unspecified abnormalities of gait and mobility: Secondary | ICD-10-CM | POA: Diagnosis not present

## 2015-05-11 DIAGNOSIS — M6289 Other specified disorders of muscle: Secondary | ICD-10-CM

## 2015-05-11 DIAGNOSIS — X58XXXD Exposure to other specified factors, subsequent encounter: Secondary | ICD-10-CM | POA: Insufficient documentation

## 2015-05-11 NOTE — Therapy (Signed)
[place], [place], [address] Phone: [phone]   Fax:  [phone]  Occupational Therapy Treatment  Patient Details  Name: Carol Gutierrez MRN: [id number] Date of Birth: 11-Dec-1935 Referring Provider: Driscilla Moats  Encounter Date: 05/11/2015      OT End of Session - 05/11/15 1611    Visit Number 29   Number of Visits 36   Date for OT Re-Evaluation 05/25/15   Authorization Type 1-Medicare/Medicare A & B;  2- [employer]   Authorization Time Period before 35th visit   Authorization - Visit Number 29   Authorization - Number of Visits 35   OT Start Time [id number]   OT Stop Time 1600   OT Time Calculation (min) 45 min   Activity Tolerance Patient tolerated treatment well   Behavior During Therapy [facility] for tasks assessed/performed      Past Medical History  Diagnosis Date  . Hypertension   . Arthritis     hands and back    Past Surgical History  Procedure Laterality Date  . Joint replacement  2008    left hip  . Colonoscopy N/A 09/08/2012    Procedure: COLONOSCOPY;  Surgeon: Rogene Houston, MD;  [facility];  Service: Endoscopy;  Laterality: N/A;  730-moved to 830 Ann to notify pt  . Total hip arthroplasty Left Left Hip Replacement    There were no vitals filed for this visit.  Visit Diagnosis:  Stiffness of shoulder joint, left  Muscle weakness of left arm  Tight fascia                    OT Treatments/Exercises (OP) - 05/11/15 1546    Exercises   Exercises Shoulder   Shoulder Exercises: Supine   Protraction PROM;5 reps;Strengthening;12 reps   Protraction Weight (lbs) 2   Horizontal ABduction PROM;5 reps;Strengthening;12 reps   Horizontal ABduction Weight (lbs) 2   External Rotation PROM;5 reps;Strengthening;12 reps   External Rotation Weight (lbs) 2   Internal Rotation PROM;5 reps;Strengthening;12 reps   Internal Rotation Weight (lbs) 2   Flexion PROM;5  reps;Strengthening;12 reps   Shoulder Flexion Weight (lbs) 2   Shoulder Exercises: Seated   Protraction AROM;12 reps   Horizontal ABduction AROM;12 reps   External Rotation AROM;12 reps   Internal Rotation AROM;12 reps   Flexion AROM;12 reps   Abduction AROM;12 reps   Shoulder Exercises: Standing   Extension Theraband;12 reps   Theraband Level (Shoulder Extension) Level 4 (Blue)   Row Theraband;12 reps   Theraband Level (Shoulder Row) Level 4 (Blue)   Retraction Theraband;12 reps   Theraband Level (Shoulder Retraction) Level 4 (Blue)   Shoulder Exercises: Stretch   Wall Stretch - Flexion 3 reps;10 seconds   Wall Stretch - ABduction 3 reps;10 seconds   Manual Therapy   Manual Therapy Myofascial release;Muscle Energy Technique   Manual therapy comments manual therapy performed prior to therex   Myofascial Release Myofascial release to left upper arm, trapezius, and scapularis regions to decrease pain and fascial restrictions and increase joint range of motion.    Muscle Energy Technique Muscle energy technique to left UE to decrease muscle spasm and increase ROM.                 OT Education - 05/11/15 1610    Education provided Yes   Education Details Pt was given 2 week free trial to [employer].   Person(s) Educated Patient   Methods Handout;Explanation  Comprehension Verbalized understanding          OT Short Term Goals - 05/02/15 1601    OT SHORT TERM GOAL #1   Title Pt will be educated on and independent in HEP.    Time 3   Period Weeks   OT SHORT TERM GOAL #2   Title Pt will decrease pain to 3/10 in LUE.    Time 3   Period Weeks   OT SHORT TERM GOAL #3   Title Pt will decrease fascial restrictions from max to mod amount in LUE.    Time 3   Period Weeks   OT SHORT TERM GOAL #4   Title Pt will increase AROM to [facility] to increase ability to donn shirts without compensatory strategies.    Time 3   Period Weeks   Status On-going   OT SHORT TERM GOAL #5    Title Pt will increase strength to 3/5 to increase ability wash hair using BUE.    Time 3   Period Weeks           OT Long Term Goals - 04/23/15 1324    OT LONG TERM GOAL #1   Title Pt will return to prior level of functioning and independence in ADL and leisure tasks.    Time 6   Period Weeks   Status On-going   OT LONG TERM GOAL #2   Title Pt will decrease pain to 1/10 or less during daily tasks.    Time 6   Period Weeks   Status On-going   OT LONG TERM GOAL #3   Title Pt will decrease fascial restrictions from mod to min amounts or less in LUE.    Time 6   Period Weeks   Status On-going   OT LONG TERM GOAL #4   Title Pt will increase AROM to WNL to increase ability to reach into overhead cabinets and lift her hair and tease it while styling.   Time 6   Period Weeks   Status On-going   OT LONG TERM GOAL #5   Title Pt will increase strength to 4/5 to increase ability to hold lightweight objects and pick up mattress while making bed.   Time 6   Period Weeks   Status On-going               Plan - 05/11/15 1611    Clinical Impression Statement A: Increased supine strengthening to 12 repetitions and increased theraband exercises to blue. Pt required VC for form and technique. Continue to have limited ROM during passive stretching.    Plan P: Continue with ball on the wall. Add Cybex row and press.        Problem List There are no active problems to display for this patient.   Ailene Ravel, OTR/L,CBIS  [phone]  05/11/2015, 4:14 PM  [place] [address] [place], [place], [address] Phone: [phone]   Fax:  [phone]  Name: Carol Gutierrez MRN: [id number] Date of Birth: October 25, 1935

## 2015-05-16 ENCOUNTER — Encounter (HOSPITAL_COMMUNITY): Payer: Self-pay

## 2015-05-16 ENCOUNTER — Ambulatory Visit (HOSPITAL_COMMUNITY): Payer: Medicare Other | Admitting: Physical Therapy

## 2015-05-16 ENCOUNTER — Ambulatory Visit (HOSPITAL_COMMUNITY): Payer: Medicare Other

## 2015-05-16 DIAGNOSIS — R29898 Other symptoms and signs involving the musculoskeletal system: Secondary | ICD-10-CM

## 2015-05-16 DIAGNOSIS — M6281 Muscle weakness (generalized): Secondary | ICD-10-CM

## 2015-05-16 DIAGNOSIS — S82142G Displaced bicondylar fracture of left tibia, subsequent encounter for closed fracture with delayed healing: Secondary | ICD-10-CM

## 2015-05-16 DIAGNOSIS — R269 Unspecified abnormalities of gait and mobility: Secondary | ICD-10-CM

## 2015-05-16 DIAGNOSIS — M25612 Stiffness of left shoulder, not elsewhere classified: Secondary | ICD-10-CM

## 2015-05-16 DIAGNOSIS — M25562 Pain in left knee: Secondary | ICD-10-CM | POA: Diagnosis not present

## 2015-05-16 DIAGNOSIS — M629 Disorder of muscle, unspecified: Secondary | ICD-10-CM | POA: Diagnosis not present

## 2015-05-16 NOTE — Therapy (Signed)
[place], [place], [address] Phone: [phone]   Fax:  [phone]  Occupational Therapy Treatment  Patient Details  Name: Carol Gutierrez MRN: [id number] Date of Birth: 05-09-1935 Referring Provider: Driscilla Moats  Encounter Date: 05/16/2015      OT End of Session - 05/16/15 1439    Visit Number 30   Number of Visits 36   Date for OT Re-Evaluation 05/25/15   Authorization Type 1-Medicare/Medicare A & B;  2- [employer]   Authorization Time Period before 35th visit   Authorization - Visit Number 30   Authorization - Number of Visits 52   OT Start Time 1355  Therapist running late with evaluation prior to this appt   OT Stop Time 1430   OT Time Calculation (min) 35 min   Activity Tolerance Patient tolerated treatment well   Behavior During Therapy [facility] for tasks assessed/performed      Past Medical History  Diagnosis Date  . Hypertension   . Arthritis     hands and back    Past Surgical History  Procedure Laterality Date  . Joint replacement  2008    left hip  . Colonoscopy N/A 09/08/2012    Procedure: COLONOSCOPY;  Surgeon: Rogene Houston, MD;  [facility];  Service: Endoscopy;  Laterality: N/A;  730-moved to 830 Ann to notify pt  . Total hip arthroplasty Left Left Hip Replacement    There were no vitals filed for this visit.  Visit Diagnosis:  Stiffness of shoulder joint, left  Muscle weakness of left arm      Subjective Assessment - 05/16/15 1405    Subjective  S: I did my exercises this morning so I tried to loosen up.    Currently in Pain? No/denies            [facility] OT Assessment - 05/16/15 1406    Assessment   Diagnosis left proximal humerus fracture   Precautions   Precautions None   Precaution Comments Progress as tolerated. Begin strengthening as able. Modalities PRN                  OT Treatments/Exercises (OP) - 05/16/15 1406    Exercises   Exercises  Shoulder   Shoulder Exercises: Supine   Protraction Strengthening;12 reps   Protraction Weight (lbs) 2   Horizontal ABduction Strengthening;12 reps   Horizontal ABduction Weight (lbs) 2   External Rotation Strengthening;12 reps   External Rotation Weight (lbs) 2   Internal Rotation Strengthening;12 reps   Internal Rotation Weight (lbs) 2   Flexion Strengthening;12 reps   Shoulder Flexion Weight (lbs) 2   Shoulder Exercises: Seated   Protraction AROM;12 reps   Horizontal ABduction AROM;12 reps   External Rotation AROM;12 reps   Internal Rotation AROM;12 reps   Flexion AROM;12 reps   Abduction AROM;12 reps   Shoulder Exercises: ROM/Strengthening   Cybex Press 1 plate;10 reps   Cybex Row 1 plate;10 reps   Proximal Shoulder Strengthening, Supine 12X with 2# with rest breaks   Ball on Wall 1' flexion 1' abduction green ball                  OT Short Term Goals - 05/02/15 1601    OT SHORT TERM GOAL #1   Title Pt will be educated on and independent in HEP.    Time 3   Period Weeks   OT SHORT TERM GOAL #2   Title Pt  will decrease pain to 3/10 in LUE.    Time 3   Period Weeks   OT SHORT TERM GOAL #3   Title Pt will decrease fascial restrictions from max to mod amount in LUE.    Time 3   Period Weeks   OT SHORT TERM GOAL #4   Title Pt will increase AROM to [facility] to increase ability to donn shirts without compensatory strategies.    Time 3   Period Weeks   Status On-going   OT SHORT TERM GOAL #5   Title Pt will increase strength to 3/5 to increase ability wash hair using BUE.    Time 3   Period Weeks           OT Long Term Goals - 04/23/15 1324    OT LONG TERM GOAL #1   Title Pt will return to prior level of functioning and independence in ADL and leisure tasks.    Time 6   Period Weeks   Status On-going   OT LONG TERM GOAL #2   Title Pt will decrease pain to 1/10 or less during daily tasks.    Time 6   Period Weeks   Status On-going   OT LONG TERM GOAL  #3   Title Pt will decrease fascial restrictions from mod to min amounts or less in LUE.    Time 6   Period Weeks   Status On-going   OT LONG TERM GOAL #4   Title Pt will increase AROM to WNL to increase ability to reach into overhead cabinets and lift her hair and tease it while styling.   Time 6   Period Weeks   Status On-going   OT LONG TERM GOAL #5   Title Pt will increase strength to 4/5 to increase ability to hold lightweight objects and pick up mattress while making bed.   Time 6   Period Weeks   Status On-going               Plan - 05/16/15 1439    Clinical Impression Statement A: Added cybex row and press and added abduction to ball on the wall. patient required VC to maintain proper from during ball on the wall in abduction.   Plan P: Add functional reaching activitiy (cones or clothepins) to focus on shoulder mobility during flexion and abduction.        Problem List There are no active problems to display for this patient.   Ailene Ravel, OTR/L,CBIS  [phone]  05/16/2015, 2:43 PM  [place] [address] [place], [place], [address] Phone: [phone]   Fax:  [phone]  Name: Carol Gutierrez MRN: [id number] Date of Birth: Dec 09, 1935

## 2015-05-16 NOTE — Therapy (Signed)
[place], [place], [address] Phone: [phone]   Fax:  [phone]  Physical Therapy Treatment  Patient Details  Name: Carol Gutierrez MRN: [id number] Date of Birth: 1935-10-26 No Data Recorded  Encounter Date: 05/16/2015      PT End of Session - 05/16/15 1522    Visit Number 31   Number of Visits 38   Date for PT Re-Evaluation 05/26/15   Authorization Type medicare    Authorization Time Period Authorized 01/25/2015-03/27/2015   Authorization - Visit Number 31   Authorization - Number of Visits 37   PT Start Time 1432   PT Stop Time 1518   PT Time Calculation (min) 46 min   Equipment Utilized During Treatment Gait belt   Activity Tolerance Patient tolerated treatment well   Behavior During Therapy [facility] for tasks assessed/performed      Past Medical History  Diagnosis Date  . Hypertension   . Arthritis     hands and back    Past Surgical History  Procedure Laterality Date  . Joint replacement  2008    left hip  . Colonoscopy N/A 09/08/2012    Procedure: COLONOSCOPY;  Surgeon: Rogene Houston, MD;  [facility];  Service: Endoscopy;  Laterality: N/A;  730-moved to 830 Ann to notify pt  . Total hip arthroplasty Left Left Hip Replacement    There were no vitals filed for this visit.  Visit Diagnosis:  Weakness of left leg  Abnormal gait  Pain in joint involving left lower leg  Tibial plateau fracture, left, closed, with delayed healing, subsequent encounter      Subjective Assessment - 05/16/15 1433    Subjective No pain reported.  States she's been using the 5# weight on her knee and thinks it is helping.    Currently in Pain? No/denies                         [facility] Adult PT Treatment/Exercise - 05/16/15 1438    Knee/Hip Exercises: Standing   Forward Lunges Both;15 reps   Forward Lunges Limitations on floor   Side Lunges Both;15 reps   Side Lunges  Limitations on floor   Hip Abduction Both;20 reps   Lateral Step Up Both;15 reps;Step Height: 6";Hand Hold: 1   Forward Step Up Both;15 reps;Step Height: 6";Right;Hand Hold: 0;Left;Hand Hold: 1   Step Down Both;10 reps;Step Height: 4";Hand Hold: 1   Rocker Board 2 minutes   Rocker Board Limitations R/L and A/P 1 HHA   Other Standing Knee Exercises Sidestepping with GTB 3RT                  PT Short Term Goals - 04/26/15 1411    PT SHORT TERM GOAL #1   Title I HEP   Time 2   Period Weeks   Status Achieved   PT SHORT TERM GOAL #2   Title Pt to be I in ambulation with rolling walker for 200 ft    Time 3   Period Weeks   Status Achieved   PT SHORT TERM GOAL #3   Title Pt to begin advance HEP ( closted chai)   Time 3   Period Weeks   Status Achieved           PT Long Term Goals - 04/26/15 1412    PT LONG TERM GOAL #1   Title Pt to be ambulating with a cane  Time 4   Period Weeks   Status Achieved   PT LONG TERM GOAL #2   Title Pt strength to be at least 4+/5 to be able to go up and down steps in a reciprocal manner.    Baseline Pt strength is not at 4+/5 with all mm but is able to go up and down steps in a reciprocal manner.    Time 6   Period Weeks   Status Achieved   PT LONG TERM GOAL #3   Time 6   Period Weeks   Status Achieved   PT LONG TERM GOAL #4   Title Pt to work out in the yard for 45 minutes without increased pain    Time 8   Period Weeks   Status --  Pt has not worked in her yard   PT [place] #5   Title Pt to be able to SLS for 10 seconds to reduce risk of falling    Time 8   Period Weeks   Status Partially Met  able to complete when she is not tired                Plan - 05/16/15 1523    Clinical Impression Statement  Continued focus on strength and balance today.   Able to increase reps with addition of forward step down actvitiy.  Pt with good control, only requiring cues with lunges.  Pt required 2 short breaks  during session today.  No LOB or instability wtih actiivities noted.     PT Next Visit Plan Continue with deeper lunges and resume elliptical next session.  Time allowed, complete PROM for improved knee extension.  Progress dynamic balance activities.         Problem List There are no active problems to display for this patient.   Teena Irani, PTA/CLT [phone] 05/16/2015, 3:37 PM  [place] [address] [place], [place], [address] Phone: [phone]   Fax:  [phone]  Name: Carol Gutierrez MRN: [id number] Date of Birth: 1936/04/27

## 2015-05-18 ENCOUNTER — Encounter (HOSPITAL_COMMUNITY): Payer: Self-pay | Admitting: Occupational Therapy

## 2015-05-18 ENCOUNTER — Ambulatory Visit (HOSPITAL_COMMUNITY): Payer: Medicare Other | Admitting: Occupational Therapy

## 2015-05-18 DIAGNOSIS — M25512 Pain in left shoulder: Secondary | ICD-10-CM

## 2015-05-18 DIAGNOSIS — S82142G Displaced bicondylar fracture of left tibia, subsequent encounter for closed fracture with delayed healing: Secondary | ICD-10-CM | POA: Diagnosis not present

## 2015-05-18 DIAGNOSIS — R269 Unspecified abnormalities of gait and mobility: Secondary | ICD-10-CM | POA: Diagnosis not present

## 2015-05-18 DIAGNOSIS — M25562 Pain in left knee: Secondary | ICD-10-CM | POA: Diagnosis not present

## 2015-05-18 DIAGNOSIS — M25612 Stiffness of left shoulder, not elsewhere classified: Secondary | ICD-10-CM

## 2015-05-18 DIAGNOSIS — M6281 Muscle weakness (generalized): Secondary | ICD-10-CM | POA: Diagnosis not present

## 2015-05-18 DIAGNOSIS — M629 Disorder of muscle, unspecified: Secondary | ICD-10-CM | POA: Diagnosis not present

## 2015-05-18 DIAGNOSIS — M6289 Other specified disorders of muscle: Secondary | ICD-10-CM

## 2015-05-18 NOTE — Therapy (Signed)
[place], [place], [address] Phone: [phone]   Fax:  [phone]  Occupational Therapy Treatment  Patient Details  Name: Carol Gutierrez MRN: [id number] Date of Birth: 1935-08-02 Referring Provider: Driscilla Moats  Encounter Date: 05/18/2015      OT End of Session - 05/18/15 1615    Visit Number 31   Number of Visits 36   Date for OT Re-Evaluation 05/25/15   Authorization Type 1-Medicare/Medicare A & B;  2- A[employer]   Authorization Time Period before 35th visit   Authorization - Visit Number 31   Authorization - Number of Visits 35   OT Start Time [id number]   OT Stop Time 1516   OT Time Calculation (min) 43 min   Activity Tolerance Patient tolerated treatment well   Behavior During Therapy [facility] for tasks assessed/performed      Past Medical History  Diagnosis Date  . Hypertension   . Arthritis     hands and back    Past Surgical History  Procedure Laterality Date  . Joint replacement  2008    left hip  . Colonoscopy N/A 09/08/2012    Procedure: COLONOSCOPY;  Surgeon: Rogene Houston, MD;  [facility];  Service: Endoscopy;  Laterality: N/A;  730-moved to 830 Ann to notify pt  . Total hip arthroplasty Left Left Hip Replacement    There were no vitals filed for this visit.  Visit Diagnosis:  Stiffness of shoulder joint, left  Muscle weakness of left arm  Tight fascia  Pain in left shoulder      Subjective Assessment - 05/18/15 1435    Subjective  S: This is a hard one but I'm going to get it. (horizontal abduction & adduction)   Currently in Pain? No/denies            [facility] OT Assessment - 05/18/15 1434    Assessment   Diagnosis left proximal humerus fracture   Precautions   Precautions None   Precaution Comments Progress as tolerated. Begin strengthening as able. Modalities PRN                  OT Treatments/Exercises (OP) - 05/18/15 1435    Exercises   Exercises Shoulder   Shoulder Exercises: Supine   Protraction Strengthening;12 reps   Protraction Weight (lbs) 2   Horizontal ABduction Strengthening;12 reps   Horizontal ABduction Weight (lbs) 2   External Rotation Strengthening;12 reps   External Rotation Weight (lbs) 2   Internal Rotation Strengthening;12 reps   Internal Rotation Weight (lbs) 2   Flexion Strengthening;12 reps   Shoulder Flexion Weight (lbs) 2   Shoulder Exercises: Standing   Extension Theraband;12 reps   Theraband Level (Shoulder Extension) Level 4 (Blue)   Row Theraband;12 reps   Theraband Level (Shoulder Row) Level 4 (Blue)   Retraction Theraband;12 reps   Theraband Level (Shoulder Retraction) Level 4 (Blue)   Shoulder Exercises: ROM/Strengthening   Cybex Press 1 plate;10 reps   Cybex Row 1 plate;10 reps   Functional Reaching Activities   High Level Pt completed functional reaching activity with resistive clothespins. Pt was ablet o place clothespins to the 18 inch mark on the vertical pole   Manual Therapy   Manual Therapy Myofascial release;Muscle Energy Technique   Manual therapy comments manual therapy performed prior to therex   Myofascial Release Myofascial release to left upper arm, trapezius, and scapularis regions to decrease pain and fascial restrictions and increase joint range  of motion.                   OT Short Term Goals - 05/02/15 1601    OT SHORT TERM GOAL #1   Title Pt will be educated on and independent in HEP.    Time 3   Period Weeks   OT SHORT TERM GOAL #2   Title Pt will decrease pain to 3/10 in LUE.    Time 3   Period Weeks   OT SHORT TERM GOAL #3   Title Pt will decrease fascial restrictions from max to mod amount in LUE.    Time 3   Period Weeks   OT SHORT TERM GOAL #4   Title Pt will increase AROM to [facility] to increase ability to donn shirts without compensatory strategies.    Time 3   Period Weeks   Status On-going   OT SHORT TERM GOAL #5   Title Pt will  increase strength to 3/5 to increase ability wash hair using BUE.    Time 3   Period Weeks           OT Long Term Goals - 04/23/15 1324    OT LONG TERM GOAL #1   Title Pt will return to prior level of functioning and independence in ADL and leisure tasks.    Time 6   Period Weeks   Status On-going   OT LONG TERM GOAL #2   Title Pt will decrease pain to 1/10 or less during daily tasks.    Time 6   Period Weeks   Status On-going   OT LONG TERM GOAL #3   Title Pt will decrease fascial restrictions from mod to min amounts or less in LUE.    Time 6   Period Weeks   Status On-going   OT LONG TERM GOAL #4   Title Pt will increase AROM to WNL to increase ability to reach into overhead cabinets and lift her hair and tease it while styling.   Time 6   Period Weeks   Status On-going   OT LONG TERM GOAL #5   Title Pt will increase strength to 4/5 to increase ability to hold lightweight objects and pick up mattress while making bed.   Time 6   Period Weeks   Status On-going               Plan - 05/18/15 1616    Clinical Impression Statement A: Added functional reaching task with resistive clothespins. Resumed scapular theraband exercises. Pt required verbal cuing for form during scapular theraband exercises.    Plan P: Add functional reaching-putting cones onto shelves of cabinet (shoulder and overhead level)        Problem List There are no active problems to display for this patient.   [name], OTR/L  [phone]  05/18/2015, 4:17 PM  [place] [address] [place], [place], [address] Phone: [phone]   Fax:  [phone]  Name: Carol Gutierrez MRN: [id number] Date of Birth: Apr 05, 1936

## 2015-05-22 ENCOUNTER — Ambulatory Visit (HOSPITAL_COMMUNITY): Payer: Medicare Other | Admitting: Occupational Therapy

## 2015-05-22 ENCOUNTER — Ambulatory Visit (HOSPITAL_COMMUNITY): Payer: Medicare Other

## 2015-05-22 ENCOUNTER — Encounter (HOSPITAL_COMMUNITY): Payer: Self-pay | Admitting: Occupational Therapy

## 2015-05-22 DIAGNOSIS — M25512 Pain in left shoulder: Secondary | ICD-10-CM

## 2015-05-22 DIAGNOSIS — M25612 Stiffness of left shoulder, not elsewhere classified: Secondary | ICD-10-CM

## 2015-05-22 DIAGNOSIS — M25562 Pain in left knee: Secondary | ICD-10-CM | POA: Diagnosis not present

## 2015-05-22 DIAGNOSIS — R29898 Other symptoms and signs involving the musculoskeletal system: Secondary | ICD-10-CM

## 2015-05-22 DIAGNOSIS — S82142G Displaced bicondylar fracture of left tibia, subsequent encounter for closed fracture with delayed healing: Secondary | ICD-10-CM | POA: Diagnosis not present

## 2015-05-22 DIAGNOSIS — R269 Unspecified abnormalities of gait and mobility: Secondary | ICD-10-CM

## 2015-05-22 DIAGNOSIS — S42202P Unspecified fracture of upper end of left humerus, subsequent encounter for fracture with malunion: Secondary | ICD-10-CM

## 2015-05-22 DIAGNOSIS — M6289 Other specified disorders of muscle: Secondary | ICD-10-CM

## 2015-05-22 DIAGNOSIS — M6281 Muscle weakness (generalized): Secondary | ICD-10-CM | POA: Diagnosis not present

## 2015-05-22 DIAGNOSIS — M629 Disorder of muscle, unspecified: Secondary | ICD-10-CM

## 2015-05-22 NOTE — Therapy (Signed)
[place], [place], [address] Phone: [phone]   Fax:  [phone]  Physical Therapy Treatment  Patient Details  Name: Carol Gutierrez MRN: [id number] Date of Birth: Nov 03, 1935 No Data Recorded  Encounter Date: 05/22/2015      PT End of Session - 05/22/15 1443    Visit Number 32   Number of Visits 38   Date for PT Re-Evaluation 05/26/15   Authorization Type medicare    Authorization Time Period Authorized 01/25/2015-03/27/2015   Authorization - Visit Number 37   Authorization - Number of Visits 37   PT Start Time 1432   PT Stop Time 1519   PT Time Calculation (min) 47 min   Equipment Utilized During Treatment Gait belt   Activity Tolerance Patient tolerated treatment well   Behavior During Therapy [facility] for tasks assessed/performed      Past Medical History  Diagnosis Date  . Hypertension   . Arthritis     hands and back    Past Surgical History  Procedure Laterality Date  . Joint replacement  2008    left hip  . Colonoscopy N/A 09/08/2012    Procedure: COLONOSCOPY;  Surgeon: Rogene Houston, MD;  [facility];  Service: Endoscopy;  Laterality: N/A;  730-moved to 830 Ann to notify pt  . Total hip arthroplasty Left Left Hip Replacement    There were no vitals filed for this visit.  Visit Diagnosis:  Weakness of left leg  Abnormal gait  Pain in joint involving left lower leg  Tibial plateau fracture, left, closed, with delayed healing, subsequent encounter  Proximal humerus fracture, left, with malunion, subsequent encounter      Subjective Assessment - 05/22/15 1433    Subjective No pain today, feels most difficulty with activity tolerance with standing.     Pertinent History CVA 12/10/2014; HTN , osteroporosis,    Patient Stated Goals to be able to walk again    Currently in Pain? No/denies             [facility] Adult PT Treatment/Exercise - 05/22/15 1445    Knee/Hip  Exercises: Aerobic   Elliptical 5' L1   Knee/Hip Exercises: Standing   Forward Lunges Both;15 reps   Forward Lunges Limitations on floor   Side Lunges Both;15 reps   Side Lunges Limitations on floor   Lateral Step Up Both;15 reps;Step Height: 6";Hand Hold: 1   Forward Step Up Both;15 reps;Hand Hold: 1;Step Height: 6"   Step Down Both;10 reps;Hand Hold: 1;Step Height: 6"   Functional Squat 15 reps   Rocker Board 2 minutes   Rocker Board Limitations R/L and A/P 1 HHA   SLS Lt 9", Rt 17" max of 3   SLS with Vectors 5" x 5 bilateral   Other Standing Knee Exercises Sidestepping with GTB 3RT   KNee Exercises: Supine   PROM 3x for Lt knee extension    AROM lacking 9 degrees extension                                                                              PT Short Term Goals - 04/26/15 1411    PT SHORT  TERM GOAL #1   Title I HEP   Time 2   Period Weeks   Status Achieved   PT SHORT TERM GOAL #2   Title Pt to be I in ambulation with rolling walker for 200 ft    Time 3   Period Weeks   Status Achieved   PT SHORT TERM GOAL #3   Title Pt to begin advance HEP ( closted chai)   Time 3   Period Weeks   Status Achieved           PT Long Term Goals - 04/26/15 1412    PT LONG TERM GOAL #1   Title Pt to be ambulating with a cane   Time 4   Period Weeks   Status Achieved   PT LONG TERM GOAL #2   Title Pt strength to be at least 4+/5 to be able to go up and down steps in a reciprocal manner.    Baseline Pt strength is not at 4+/5 with all mm but is able to go up and down steps in a reciprocal manner.    Time 6   Period Weeks   Status Achieved   PT LONG TERM GOAL #3   Time 6   Period Weeks   Status Achieved   PT LONG TERM GOAL #4   Title Pt to work out in the yard for 45 minutes without increased pain    Time 8   Period Weeks   Status --  Pt has not worked in her yard   PT [place] #5   Title Pt to be able to SLS for 10 seconds to  reduce risk of falling    Time 8   Period Weeks   Status Partially Met  able to complete when she is not tired                Plan - 05/22/15 1522    Clinical Impression Statement Continued session focus on functional strengthening and balance.  Able to increase height with step down training foe eccentric quad control, decreased reps due to increased demand.  Pt improving activity tolerance with 1 seated rest break required through session.  Improved tolerance with PROM for Lt knee  extension.  AROM 9 degrees lacking full extension.  Pt limited by fatoigue at end of session, increased time to 5 minutes on elliptical.  No reports of pain.   PT Next Visit Plan Continue with current PT POC for functional strengthening and balance.  Continue with deeper lunges, elliptical, PROM for knee extension.  Progress dynamic balance activities.        Problem List There are no active problems to display for this patient.  [address], LPTA; [place]  Aldona Lento 05/22/2015, 3:35 PM  [place], [place], [address] Phone: [phone]   Fax:  [phone]  Name: Carol Gutierrez MRN: [id number] Date of Birth: [birth year]

## 2015-05-22 NOTE — Therapy (Signed)
[place], [place], [address] Phone: [phone]   Fax:  [phone]  Occupational Therapy Treatment  Patient Details  Name: Carol Gutierrez MRN: [id number] Date of Birth: 07/19/1935 Referring Provider: Driscilla Moats  Encounter Date: 05/22/2015      OT End of Session - 05/22/15 1441    Visit Number 32   Number of Visits 36   Date for OT Re-Evaluation 05/25/15   Authorization Type 1-Medicare/Medicare A & B;  2- [employer]   Authorization Time Period before 35th visit   Authorization - Visit Number 74   Authorization - Number of Visits 35   OT Start Time 1351   OT Stop Time 1430   OT Time Calculation (min) 39 min   Activity Tolerance Patient tolerated treatment well   Behavior During Therapy [facility] for tasks assessed/performed      Past Medical History  Diagnosis Date  . Hypertension   . Arthritis     hands and back    Past Surgical History  Procedure Laterality Date  . Joint replacement  2008    left hip  . Colonoscopy N/A 09/08/2012    Procedure: COLONOSCOPY;  Surgeon: Rogene Houston, MD;  [facility];  Service: Endoscopy;  Laterality: N/A;  730-moved to 830 Ann to notify pt  . Total hip arthroplasty Left Left Hip Replacement    There were no vitals filed for this visit.  Visit Diagnosis:  Stiffness of shoulder joint, left  Muscle weakness of left arm  Tight fascia  Pain in left shoulder      Subjective Assessment - 05/22/15 1355    Subjective  S: I still can't reach up as high as I would like to be able to.    Currently in Pain? No/denies            [facility] OT Assessment - 05/22/15 1355    Assessment   Diagnosis left proximal humerus fracture   Precautions   Precautions None   Precaution Comments Progress as tolerated. Begin strengthening as able. Modalities PRN                  OT Treatments/Exercises (OP) - 05/22/15 1355    Exercises   Exercises Shoulder   Shoulder Exercises: Supine   Protraction Strengthening;12 reps   Protraction Weight (lbs) 2   Horizontal ABduction Strengthening;12 reps   Horizontal ABduction Weight (lbs) 2   External Rotation Strengthening;12 reps   External Rotation Weight (lbs) 2   Internal Rotation Strengthening;12 reps   Internal Rotation Weight (lbs) 2   Flexion Strengthening;12 reps   Shoulder Flexion Weight (lbs) 2   Shoulder Exercises: ROM/Strengthening   Cybex Press 1.5 plate;15 reps   Cybex Row 1.5 plate;15 reps   Functional Reaching Activities   High Level Pt completed functional reaching task, placing cones on a shelf at shoulder height and overhead. Pt used LUE to place and remove cones, placed 10 cones with min difficulty at shoulder level and mod difficulty on overhead shelves.    Manual Therapy   Manual Therapy Myofascial release;Muscle Energy Technique   Manual therapy comments manual therapy performed prior to therex   Myofascial Release Myofascial release to left upper arm, trapezius, and scapularis regions to decrease pain and fascial restrictions and increase joint range of motion.                   OT Short Term Goals - 05/02/15 1601  OT SHORT TERM GOAL #1   Title Pt will be educated on and independent in HEP.    Time 3   Period Weeks   OT SHORT TERM GOAL #2   Title Pt will decrease pain to 3/10 in LUE.    Time 3   Period Weeks   OT SHORT TERM GOAL #3   Title Pt will decrease fascial restrictions from max to mod amount in LUE.    Time 3   Period Weeks   OT SHORT TERM GOAL #4   Title Pt will increase AROM to [facility] to increase ability to donn shirts without compensatory strategies.    Time 3   Period Weeks   Status On-going   OT SHORT TERM GOAL #5   Title Pt will increase strength to 3/5 to increase ability wash hair using BUE.    Time 3   Period Weeks           OT Long Term Goals - 04/23/15 1324    OT LONG TERM GOAL #1   Title Pt will return to prior level of  functioning and independence in ADL and leisure tasks.    Time 6   Period Weeks   Status On-going   OT LONG TERM GOAL #2   Title Pt will decrease pain to 1/10 or less during daily tasks.    Time 6   Period Weeks   Status On-going   OT LONG TERM GOAL #3   Title Pt will decrease fascial restrictions from mod to min amounts or less in LUE.    Time 6   Period Weeks   Status On-going   OT LONG TERM GOAL #4   Title Pt will increase AROM to WNL to increase ability to reach into overhead cabinets and lift her hair and tease it while styling.   Time 6   Period Weeks   Status On-going   OT LONG TERM GOAL #5   Title Pt will increase strength to 4/5 to increase ability to hold lightweight objects and pick up mattress while making bed.   Time 6   Period Weeks   Status On-going               Plan - 05/22/15 1441    Clinical Impression Statement A: Added functional reaching task placing and removing cones on overhead shelves. Increased cybex row/press to 1.5# plate with 15 repetitions. Discussed reassessment and possible discharge next session with pt.    Plan P: Reassessment, G-code, determine if additional therapy is warranted or if pt is ready for discharge with HEP.         Problem List There are no active problems to display for this patient.   [name], OTR/L  [phone]  05/22/2015, 2:44 PM  [place] [address] [place], [place], [address] Phone: [phone]   Fax:  [phone]  Name: ALLISSIA LONSKI MRN: [id number] Date of Birth: [birth year]

## 2015-05-24 ENCOUNTER — Encounter (HOSPITAL_COMMUNITY): Payer: Medicare Other

## 2015-05-24 ENCOUNTER — Ambulatory Visit (HOSPITAL_COMMUNITY): Payer: Medicare Other

## 2015-05-24 DIAGNOSIS — R29898 Other symptoms and signs involving the musculoskeletal system: Secondary | ICD-10-CM

## 2015-05-24 DIAGNOSIS — M629 Disorder of muscle, unspecified: Secondary | ICD-10-CM | POA: Diagnosis not present

## 2015-05-24 DIAGNOSIS — M6281 Muscle weakness (generalized): Secondary | ICD-10-CM | POA: Diagnosis not present

## 2015-05-24 DIAGNOSIS — S82142G Displaced bicondylar fracture of left tibia, subsequent encounter for closed fracture with delayed healing: Secondary | ICD-10-CM

## 2015-05-24 DIAGNOSIS — M25562 Pain in left knee: Secondary | ICD-10-CM | POA: Diagnosis not present

## 2015-05-24 DIAGNOSIS — R269 Unspecified abnormalities of gait and mobility: Secondary | ICD-10-CM | POA: Diagnosis not present

## 2015-05-24 DIAGNOSIS — M25612 Stiffness of left shoulder, not elsewhere classified: Secondary | ICD-10-CM | POA: Diagnosis not present

## 2015-05-24 NOTE — Therapy (Signed)
[place], [place], [address] Phone: [phone]   Fax:  [phone]  Physical Therapy Treatment  Patient Details  Name: Carol Gutierrez MRN: [id number] Date of Birth: 09/11/1935 No Data Recorded  Encounter Date: 05/24/2015      PT End of Session - 05/24/15 1525    Visit Number 33   Number of Visits 38   Date for PT Re-Evaluation 05/26/15   Authorization Type medicare    Authorization Time Period Authorized 01/25/2015-03/27/2015   Authorization - Visit Number 27   Authorization - Number of Visits 37   PT Start Time 1520   PT Stop Time 1610   PT Time Calculation (min) 50 min   Equipment Utilized During Treatment Gait belt   Activity Tolerance Patient tolerated treatment well   Behavior During Therapy [facility] for tasks assessed/performed      Past Medical History  Diagnosis Date  . Hypertension   . Arthritis     hands and back    Past Surgical History  Procedure Laterality Date  . Joint replacement  2008    left hip  . Colonoscopy N/A 09/08/2012    Procedure: COLONOSCOPY;  Surgeon: Rogene Houston, MD;  [facility];  Service: Endoscopy;  Laterality: N/A;  730-moved to 830 Ann to notify pt  . Total hip arthroplasty Left Left Hip Replacement    There were no vitals filed for this visit.  Visit Diagnosis:  Weakness of left leg  Abnormal gait  Pain in joint involving left lower leg  Tibial plateau fracture, left, closed, with delayed healing, subsequent encounter      Subjective Assessment - 05/24/15 1523    Subjective Pt stated she is feeling good today, reports main difficulty she continues to have is lifting objects and moving slow with stairs.     Pertinent History CVA 12/10/2014; HTN , osteroporosis,    Patient Stated Goals to be able to walk again    Currently in Pain? No/denies             [facility] Adult PT Treatment/Exercise - 05/24/15 0001    Knee/Hip Exercises: Aerobic   Elliptical 5' L1   Knee/Hip Exercises: Standing   Forward Lunges Both;15 reps   Forward Lunges Limitations on floor   Side Lunges Both;15 reps   Side Lunges Limitations on floor   Lateral Step Up Both;15 reps;Step Height: 6";Hand Hold: 1   Functional Squat 10 reps;Other (comment);Limitations   Functional Squat Limitations proper lifting 8# box from 12in step with tactile and verbal cueing for form   Stairs 7in x 3RT 1 HHR cueing for reciprocal pattern and to reduce circumduction ascending   Rocker Board 2 minutes   Rocker Board Limitations R/L and A/P no HHA   SLS Rt 20", Lt 8" max of 3   SLS with Vectors 5" x 5 bilateral             Balance Exercises - 05/24/15 1540    Balance Exercises: Standing   Tandem Stance Foam/compliant surface;3 reps;30 secs           PT Short Term Goals - 04/26/15 1411    PT SHORT TERM GOAL #1   Title I HEP   Time 2   Period Weeks   Status Achieved   PT SHORT TERM GOAL #2   Title Pt to be I in ambulation with rolling walker for 200 ft    Time 3   Period  Weeks   Status Achieved   PT SHORT TERM GOAL #3   Title Pt to begin advance HEP ( closted chai)   Time 3   Period Weeks   Status Achieved           PT Long Term Goals - 04/26/15 1412    PT LONG TERM GOAL #1   Title Pt to be ambulating with a cane   Time 4   Period Weeks   Status Achieved   PT LONG TERM GOAL #2   Title Pt strength to be at least 4+/5 to be able to go up and down steps in a reciprocal manner.    Baseline Pt strength is not at 4+/5 with all mm but is able to go up and down steps in a reciprocal manner.    Time 6   Period Weeks   Status Achieved   PT LONG TERM GOAL #3   Time 6   Period Weeks   Status Achieved   PT LONG TERM GOAL #4   Title Pt to work out in the yard for 45 minutes without increased pain    Time 8   Period Weeks   Status --  Pt has not worked in her yard   PT [place] #5   Title Pt to be able to SLS for 10 seconds to reduce risk of  falling    Time 8   Period Weeks   Status Partially Met  able to complete when she is not tired                Plan - 05/24/15 1545    Clinical Impression Statement Added proper lifting activities following reports of difficutly to improve functional gluteal strengthening with proper form, tacile and verbal cueing to improve form.  Session focus on improving functional strengthening and balance.  Resumed stair training to improve confidence with stairs, verbal cueing to improve Lt LE flexion to reduce circumduciton ascending and cueing to reduce trunk rotation descending.  Ended session with elliptical unsupervised for activity tolerance, no charge.  Pt reports she has plans to begin [facility] tomorrow.     PT Next Visit Plan Continue with current PT POC for functional strengthening and balance.  Continue with deeper lunges, elliptical, PROM for knee extension.  Progress dynamic balance activities.        Problem List There are no active problems to display for this patient. [address] LPTA; [place]   Aldona Lento 05/24/2015, 5:41 PM  [place], [place], [address] Phone: [phone]   Fax:  [phone]  Name: Carol Gutierrez MRN: [id number] Date of Birth: [birth year]

## 2015-05-25 ENCOUNTER — Encounter (HOSPITAL_COMMUNITY): Payer: Self-pay

## 2015-05-25 ENCOUNTER — Ambulatory Visit (HOSPITAL_COMMUNITY): Payer: Medicare Other

## 2015-05-25 DIAGNOSIS — M629 Disorder of muscle, unspecified: Secondary | ICD-10-CM | POA: Diagnosis not present

## 2015-05-25 DIAGNOSIS — M6281 Muscle weakness (generalized): Secondary | ICD-10-CM | POA: Diagnosis not present

## 2015-05-25 DIAGNOSIS — M25512 Pain in left shoulder: Secondary | ICD-10-CM

## 2015-05-25 DIAGNOSIS — S82142G Displaced bicondylar fracture of left tibia, subsequent encounter for closed fracture with delayed healing: Secondary | ICD-10-CM | POA: Diagnosis not present

## 2015-05-25 DIAGNOSIS — M25562 Pain in left knee: Secondary | ICD-10-CM | POA: Diagnosis not present

## 2015-05-25 DIAGNOSIS — M6289 Other specified disorders of muscle: Secondary | ICD-10-CM

## 2015-05-25 DIAGNOSIS — R269 Unspecified abnormalities of gait and mobility: Secondary | ICD-10-CM | POA: Diagnosis not present

## 2015-05-25 DIAGNOSIS — M25612 Stiffness of left shoulder, not elsewhere classified: Secondary | ICD-10-CM | POA: Diagnosis not present

## 2015-05-25 NOTE — Patient Instructions (Signed)
  Flexibility: Corner Stretch   Standing in corner with hands just above shoulder level and feet ____ inches from corner, lean forward until a comfortable stretch is felt across chest. Hold _10___ seconds. Repeat __3__ times per set. Do ____ sets per session. Do ____ sessions per day.   Flexors Stretch, Standing   Stand near wall and slide arm up, with palm facing  wall, by leaning toward wall. Hold _10__ seconds.  Repeat _3__ times per session. Do ___ sessions per day.  Wall walk abduction  Standing with the wall on your involved side, place forearm on the wall.  Walk your arm up the wall as shown in picture. Hold for 10 seconds. Repeat 3 times.   Strengthening: Chest Pull - Resisted   Hold Theraband in front of body with hands about shoulder width a part. Pull band a part and back together slowly. Repeat ____ times. Complete ____ set(s) per session.. Repeat ____ session(s) per day.   Resisted External Rotation: in Neutral - Bilateral   Sit or stand, tubing in both hands, elbows at sides, bent to 90, forearms forward. Pinch shoulder blades together and rotate forearms out. Keep elbows at sides. Repeat ____ times per set. Do ____ sets per session. Do ____ sessions per day.

## 2015-05-25 NOTE — Therapy (Signed)
[place], [place], [address] Phone: [phone]   Fax:  [phone]  Occupational Therapy Treatment and reassessment  Patient Details  Name: Carol Gutierrez MRN: [id number] Date of Birth: Oct 10, 1935 Referring Provider: Driscilla Moats  Encounter Date: 05/25/2015      OT End of Session - 05/25/15 1406    Visit Number 33   Number of Visits 36   Authorization Type 1-Medicare/Medicare A & B;  2- [employer]   Authorization Time Period before 35th visit   Authorization - Visit Number 33   Authorization - Number of Visits 35   OT Start Time 5993   OT Stop Time 1020   OT Time Calculation (min) 45 min   Activity Tolerance Patient tolerated treatment well   Behavior During Therapy [facility] for tasks assessed/performed      Past Medical History  Diagnosis Date  . Hypertension   . Arthritis     hands and back    Past Surgical History  Procedure Laterality Date  . Joint replacement  2008    left hip  . Colonoscopy N/A 09/08/2012    Procedure: COLONOSCOPY;  Surgeon: Rogene Houston, MD;  [facility];  Service: Endoscopy;  Laterality: N/A;  730-moved to 830 Ann to notify pt  . Total hip arthroplasty Left Left Hip Replacement    There were no vitals filed for this visit.  Visit Diagnosis:  Stiffness of shoulder joint, left  Muscle weakness of left arm  Tight fascia  Pain in left shoulder      Subjective Assessment - 05/25/15 1403    Subjective  S: I know I still have to work on my arm and it'll be a long process.    Currently in Pain? Yes   Pain Score 3    Pain Location Shoulder   Pain Orientation Left   Pain Descriptors / Indicators Sore   Pain Type Acute pain            [facility] OT Assessment - 05/25/15 0946    Assessment   Diagnosis left proximal humerus fracture   Precautions   Precautions None   Precaution Comments Progress as tolerated. Begin strengthening as able. Modalities PRN   AROM    Overall AROM Comments Assessed in seated, er/IR adducted    AROM Assessment Site Shoulder   Right/Left Shoulder Left   Left Shoulder Flexion 100 Degrees  previous; 95   Left Shoulder ABduction 91 Degrees  previous: 65   Left Shoulder Internal Rotation 90 Degrees  previous: 90   Left Shoulder External Rotation 45 Degrees  previous: 40   PROM   Overall PROM Comments Assessed in supine, er/IR adducted   PROM Assessment Site Shoulder   Right/Left Shoulder Left   Left Shoulder Flexion 160 Degrees  previous: 160   Left Shoulder ABduction 180 Degrees  previous: 175   Left Shoulder Internal Rotation 90 Degrees  previous: 90   Left Shoulder External Rotation 75 Degrees  previous: 70   Strength   Overall Strength Comments Assessed seated. IR/er adducted   Strength Assessment Site Shoulder   Right/Left Shoulder Left   Left Shoulder Flexion 3+/5  previous: 3+/5   Left Shoulder ABduction 3+/5  previous: 3+/5   Left Shoulder Internal Rotation 3+/5  previous: 3+/5   Left Shoulder External Rotation 3+/5  previous: 3+/5                  OT Treatments/Exercises (OP) - 05/25/15  1404    Exercises   Exercises Shoulder   Shoulder Exercises: Supine   Protraction PROM;5 reps   Horizontal ABduction PROM;5 reps   External Rotation PROM;5 reps   Internal Rotation PROM;5 reps   Flexion PROM;5 reps   Shoulder Exercises: Standing   Horizontal ABduction Theraband;10 reps   Theraband Level (Shoulder Horizontal ABduction) Level 2 (Red)   Internal Rotation Theraband;10 reps   Theraband Level (Shoulder Internal Rotation) Level 2 (Red)   Manual Therapy   Manual Therapy Myofascial release;Muscle Energy Technique   Manual therapy comments manual therapy performed prior to therex   Myofascial Release Myofascial release to left upper arm, trapezius, and scapularis regions to decrease pain and fascial restrictions and increase joint range of motion.    Muscle Energy Technique Muscle  energy technique to left UE to decrease muscle spasm and increase ROM.              OT Education - 05/25/15 1405    Education provided Yes   Education Details shoulder stretches, theraband strengthening exercises   Person(s) Educated Patient   Methods Explanation;Demonstration;Handout;Verbal cues   Comprehension Verbalized understanding;Returned demonstration          OT Short Term Goals - 05/25/15 0958    OT SHORT TERM GOAL #1   Title Pt will be educated on and independent in HEP.    Time 3   Period Weeks   OT SHORT TERM GOAL #2   Title Pt will decrease pain to 3/10 in LUE.    Time 3   Period Weeks   OT SHORT TERM GOAL #3   Title Pt will decrease fascial restrictions from max to mod amount in LUE.    Time 3   Period Weeks   OT SHORT TERM GOAL #4   Title Pt will increase AROM to [facility] to increase ability to donn shirts without compensatory strategies.    Time 3   Period Weeks   Status On-going   OT SHORT TERM GOAL #5   Title Pt will increase strength to 3/5 to increase ability wash hair using BUE.    Time 3   Period Weeks           OT Long Term Goals - 05/25/15 2119    OT LONG TERM GOAL #1   Title Pt will return to prior level of functioning and independence in ADL and leisure tasks.    Time 6   Period Weeks   Status Achieved   OT LONG TERM GOAL #2   Title Pt will decrease pain to 1/10 or less during daily tasks.    Time 6   Period Weeks   Status Not Met   OT LONG TERM GOAL #3   Title Pt will decrease fascial restrictions from mod to min amounts or less in LUE.    Time 6   Period Weeks   Status Achieved   OT LONG TERM GOAL #4   Title Pt will increase AROM to WNL to increase ability to reach into overhead cabinets and lift her hair and tease it while styling.   Time 6   Period Weeks   Status Not Met   OT LONG TERM GOAL #5   Title Pt will increase strength to 4/5 to increase ability to hold lightweight objects and pick up mattress while making bed.    Time 6   Period Weeks   Status Not Met  Plan - 06/02/2015 1406    Clinical Impression Statement A: Reassessment completed this date. patient has met 4/5 short term goals and 2/5 long term goals. Patient reports that she feels she is able to complete HEP at home to continue working work ROM and strengthening. Pt has an 11am appt at the [facility] and plans on joining. HEP updated.    Plan P: D/C from therapy with HEP.          G-Codes - Jun 02, 2015 1408    Functional Assessment Tool Used FOTO score: 61/100 (39% impaired)    Functional Limitation Carrying, moving and handling objects   Carrying, Moving and Handling Objects Goal Status (H3125) At least 20 percent but less than 40 percent impaired, limited or restricted   Carrying, Moving and Handling Objects Discharge Status [phone]) At least 20 percent but less than 40 percent impaired, limited or restricted     OCCUPATIONAL THERAPY DISCHARGE SUMMARY  Visits from Start of Care: 33  Current functional level related to goals / functional outcomes: See above   Remaining deficits: Patient reports that she is able to complete all tasks with modifications as she does not have full ROM which causes difficulties when reaching overhead into cabinet or teasing hair.    Education / Equipment: Shoulder stretches and theraband strengthening.  Plan: Patient agrees to discharge.  Patient goals were partially met. Patient is being discharged due to meeting the stated rehab goals.  ?????       Problem List There are no active problems to display for this patient.   Ailene Ravel, OTR/L,CBIS  [phone]  Jun 02, 2015, 2:09 PM  [place] [address] [place], [place], [address] Phone: [phone]   Fax:  [phone]  Name: YARI SZELIGA MRN: [id number] Date of Birth: 03-Jun-1935

## 2015-05-29 ENCOUNTER — Ambulatory Visit (HOSPITAL_COMMUNITY): Payer: Medicare Other | Admitting: Physical Therapy

## 2015-05-29 ENCOUNTER — Encounter (HOSPITAL_COMMUNITY): Payer: Medicare Other | Admitting: Occupational Therapy

## 2015-05-29 DIAGNOSIS — M6281 Muscle weakness (generalized): Secondary | ICD-10-CM | POA: Diagnosis not present

## 2015-05-29 DIAGNOSIS — M25562 Pain in left knee: Secondary | ICD-10-CM | POA: Diagnosis not present

## 2015-05-29 DIAGNOSIS — R269 Unspecified abnormalities of gait and mobility: Secondary | ICD-10-CM

## 2015-05-29 DIAGNOSIS — S42202P Unspecified fracture of upper end of left humerus, subsequent encounter for fracture with malunion: Secondary | ICD-10-CM

## 2015-05-29 DIAGNOSIS — M629 Disorder of muscle, unspecified: Secondary | ICD-10-CM | POA: Diagnosis not present

## 2015-05-29 DIAGNOSIS — S82142G Displaced bicondylar fracture of left tibia, subsequent encounter for closed fracture with delayed healing: Secondary | ICD-10-CM

## 2015-05-29 DIAGNOSIS — M25612 Stiffness of left shoulder, not elsewhere classified: Secondary | ICD-10-CM | POA: Diagnosis not present

## 2015-05-29 NOTE — Therapy (Signed)
[place], [place], [address] Phone: [phone]   Fax:  [phone]  Physical Therapy Treatment  Patient Details  Name: Carol Gutierrez MRN: [id number] Date of Birth: 03/11/1936 No Data Recorded  Encounter Date: 05/29/2015      PT End of Session - 05/29/15 1538    Visit Number 34   Number of Visits 38   Date for PT Re-Evaluation 05/26/15   Authorization Type medicare    Authorization Time Period Authorized 04/04/2015-06/04/2015   Authorization - Visit Number 38   Authorization - Number of Visits 37   PT Start Time 1440   PT Stop Time 1525   PT Time Calculation (min) 45 min   Equipment Utilized During Treatment Gait belt   Activity Tolerance Patient tolerated treatment well   Behavior During Therapy [facility] for tasks assessed/performed      Past Medical History  Diagnosis Date  . Hypertension   . Arthritis     hands and back    Past Surgical History  Procedure Laterality Date  . Joint replacement  2008    left hip  . Colonoscopy N/A 09/08/2012    Procedure: COLONOSCOPY;  Surgeon: Rogene Houston, MD;  [facility];  Service: Endoscopy;  Laterality: N/A;  730-moved to 830 Ann to notify pt  . Total hip arthroplasty Left Left Hip Replacement    There were no vitals filed for this visit.  Visit Diagnosis:  Abnormal gait  Pain in joint involving left lower leg  Tibial plateau fracture, left, closed, with delayed healing, subsequent encounter  Proximal humerus fracture, left, with malunion, subsequent encounter      Subjective Assessment - 05/29/15 1546    Subjective Pt states she went to the [facility] Friday and it went really well.  STates she feels she will be able to transition to the [facility] independently.  Pt reports no major difficulties at home and is eager for Spring to begin working with her flowers.    Currently in Pain? No/denies                         [facility] Adult PT  Treatment/Exercise - 05/29/15 0001    Knee/Hip Exercises: Standing   Forward Lunges Both;15 reps   Forward Lunges Limitations on floor   Side Lunges Both;15 reps   Side Lunges Limitations on floor   Functional Squat 10 reps;Other (comment);Limitations   Functional Squat Limitations proper lifting 8# box from 12in step with tactile and verbal cueing for form   Gait Training without AD X 275 feet             Balance Exercises - 05/29/15 1445    Balance Exercises: Standing   Tandem Stance Foam/compliant surface;30 secs;2 reps   Tandem Gait Foam/compliant surface;2 reps   Retro Gait Foam/compliant surface;2 reps   Sidestepping Foam/compliant support;2 reps   Cone Rotation Limitations on airex pad transferring cones from 14" box and 6" box to COW             PT Short Term Goals - 04/26/15 1411    PT SHORT TERM GOAL #1   Title I HEP   Time 2   Period Weeks   Status Achieved   PT SHORT TERM GOAL #2   Title Pt to be I in ambulation with rolling walker for 200 ft    Time 3   Period Weeks   Status Achieved  PT SHORT TERM GOAL #3   Title Pt to begin advance HEP ( closted chai)   Time 3   Period Weeks   Status Achieved           PT Long Term Goals - 04/26/15 1412    PT LONG TERM GOAL #1   Title Pt to be ambulating with a cane   Time 4   Period Weeks   Status Achieved   PT LONG TERM GOAL #2   Title Pt strength to be at least 4+/5 to be able to go up and down steps in a reciprocal manner.    Baseline Pt strength is not at 4+/5 with all mm but is able to go up and down steps in a reciprocal manner.    Time 6   Period Weeks   Status Achieved   PT LONG TERM GOAL #3   Time 6   Period Weeks   Status Achieved   PT LONG TERM GOAL #4   Title Pt to work out in the yard for 45 minutes without increased pain    Time 8   Period Weeks   Status --  Pt has not worked in her yard   PT [place] #5   Title Pt to be able to SLS for 10 seconds to reduce risk of  falling    Time 8   Period Weeks   Status Partially Met  able to complete when she is not tired                Plan - 05/29/15 1540    Clinical Impression Statement Continued to focus on improving balance and functional actvities to increase safety at home. Pt required cues to complete cone transfer actvitiy in correct squatting form when going to lower surfaces.  Pt overall has improved stability with minimal LOB and able to self correct without assistance.  Pt declined to elliptical at end of session.  Plans on returning to [facility] again this week.  Returns to [place] 1/30.     PT Next Visit Plan Re-evaluate next session.         Problem List There are no active problems to display for this patient.   Teena Irani, PTA/CLT [phone]  05/29/2015, 3:49 PM  [place] [address] [place], [place], [address] Phone: [phone]   Fax:  [phone]  Name: Carol Gutierrez MRN: [id number] Date of Birth: 04/21/1936

## 2015-05-31 ENCOUNTER — Encounter (HOSPITAL_COMMUNITY): Payer: Medicare Other | Admitting: Occupational Therapy

## 2015-05-31 ENCOUNTER — Ambulatory Visit (HOSPITAL_COMMUNITY): Payer: Medicare Other | Admitting: Physical Therapy

## 2015-05-31 DIAGNOSIS — M6281 Muscle weakness (generalized): Secondary | ICD-10-CM | POA: Diagnosis not present

## 2015-05-31 DIAGNOSIS — R269 Unspecified abnormalities of gait and mobility: Secondary | ICD-10-CM | POA: Diagnosis not present

## 2015-05-31 DIAGNOSIS — M629 Disorder of muscle, unspecified: Secondary | ICD-10-CM | POA: Diagnosis not present

## 2015-05-31 DIAGNOSIS — R29898 Other symptoms and signs involving the musculoskeletal system: Secondary | ICD-10-CM

## 2015-05-31 DIAGNOSIS — M25562 Pain in left knee: Secondary | ICD-10-CM | POA: Diagnosis not present

## 2015-05-31 DIAGNOSIS — S82142G Displaced bicondylar fracture of left tibia, subsequent encounter for closed fracture with delayed healing: Secondary | ICD-10-CM | POA: Diagnosis not present

## 2015-05-31 DIAGNOSIS — M25612 Stiffness of left shoulder, not elsewhere classified: Secondary | ICD-10-CM | POA: Diagnosis not present

## 2015-05-31 NOTE — Therapy (Signed)
[place], [place], [address] Phone: [phone]   Fax:  [phone]  Physical Therapy Treatment  Patient Details  Name: Carol Gutierrez MRN: [id number] Date of Birth: [birth year] No Data Recorded  Encounter Date: 05/31/2015      PT End of Session - 05/31/15 1501    Visit Number 35   Number of Visits 35   Authorization Type medicare    Authorization Time Period Authorized 04/04/2015-06/04/2015   Authorization - Visit Number 51   Authorization - Number of Visits 35   PT Start Time 1434   PT Stop Time 1459   PT Time Calculation (min) 25 min   Activity Tolerance Patient tolerated treatment well   Behavior During Therapy [facility] for tasks assessed/performed      Past Medical History  Diagnosis Date  . Hypertension   . Arthritis     hands and back    Past Surgical History  Procedure Laterality Date  . Joint replacement  2008    left hip  . Colonoscopy N/A 09/08/2012    Procedure: COLONOSCOPY;  Surgeon: Rogene Houston, MD;  [facility];  Service: Endoscopy;  Laterality: N/A;  730-moved to 830 Ann to notify pt  . Total hip arthroplasty Left Left Hip Replacement    There were no vitals filed for this visit.  Visit Diagnosis:  Abnormal gait  Pain in joint involving left lower leg  Tibial plateau fracture, left, closed, with delayed healing, subsequent encounter  Weakness of left leg      Subjective Assessment - 05/31/15 1459    Subjective Pt states that she went to the [facility] today an is going to join the silver sneakers.  States that she does not use her cane the majority of the time.    How long can you sit comfortably? no problem;   How long can you stand comfortably? an hour was 45 mintues a month ago    How long can you walk comfortably? able to walk unlimited with a cane now was 30 minutes    Currently in Pain? No/denies            [facility] PT Assessment - 05/31/15 0001    Observation/Other Assessments   Focus on Therapeutic Outcomes (FOTO)  71   Single Leg Stance   Comments Rt 26 was 15 Lt 21 was 10    Sit to Stand   Comments 5 sit to stand 11.11 was 11.62 normal for age 80    Strength   Left Hip Flexion 5/5   Left Hip Extension 4/5  was 4-/5    Left Hip ABduction 4-/5  was 3+/5    Left Hip ADduction 5/5   Left Knee Flexion 5/5   Left Knee Extension 5/5   Left Ankle Dorsiflexion 4/5  ws 4/5                              PT Education - 05/31/15 1500    Education provided Yes   Education Details reviewed what mm are still weak and what exercises to do for these specific mm.  The importance of continuing to work on balance everyday for the rest of her life.    Person(s) Educated Patient   Methods Explanation   Comprehension Verbalized understanding          PT Short Term Goals - 05/31/15 1453    PT SHORT  TERM GOAL #1   Title I HEP   Time 2   Period Weeks   Status Achieved   PT SHORT TERM GOAL #2   Title Pt to be I in ambulation with rolling walker for 200 ft    Time 3   Period Weeks   Status Achieved   PT SHORT TERM GOAL #3   Title Pt to begin advance HEP ( closted chai)   Time 3   Period Weeks   Status Achieved           PT Long Term Goals - Jun 09, 2015 1453    PT LONG TERM GOAL #1   Title Pt to be ambulating with a cane   Time 4   Period Weeks   Status Achieved   PT LONG TERM GOAL #2   Title Pt strength to be at least 4+/5 to be able to go up and down steps in a reciprocal manner.    Time 6   Period Weeks   Status Achieved   PT LONG TERM GOAL #3   Title Pt to be walking in her home without an assistive device    Time 6   Period Weeks   Status Achieved   PT LONG TERM GOAL #4   Title Pt to work out in the yard for 45 minutes without increased pain    Baseline weather has not permitted    Time 8   Period Weeks   Status Deferred   PT LONG TERM GOAL #5   Title Pt to be able to SLS for 10 seconds  to reduce risk of falling    Time 8   Period Weeks   Status Partially Met               Plan - 09-Jun-2015 1502    Clinical Impression Statement Pt reassessed she continues to improve in both her mm strength as well as balance.  Both patient and therapist agree that patient is at the point where she can work on these deficits at the [facility] on her own.   PT Next Visit Plan Pt to be discharged to  home exercise program.           G-Codes - 06-09-2015 1506    Functional Assessment Tool Used foto   Functional Limitation Mobility: Walking and moving around   Mobility: Walking and Moving Around Goal Status [phone]) At least 20 percent but less than 40 percent impaired, limited or restricted   Mobility: Walking and Moving Around Discharge Status [phone]) At least 20 percent but less than 40 percent impaired, limited or restricted      Problem List There are no active problems to display for this patient.  Rayetta Humphrey, PT CLT [phone] 06/09/2015, 3:07 PM  [place], [place], [address] Phone: [phone]   Fax:  [phone]  Name: Carol Gutierrez MRN: [id number] Date of Birth: Jun 11, 1935   PHYSICAL THERAPY DISCHARGE SUMMARY  Visits from Start of Care: 35  Current functional level related to goals / functional outcomes: See above   Remaining deficits: See above   Education / Equipment: HEP  Plan: Patient agrees to discharge.  Patient goals were met. Patient is being discharged due to meeting the stated rehab goals.  ?????       Rayetta Humphrey, [place] CLT [phone]

## 2015-06-07 DIAGNOSIS — S42212D Unspecified displaced fracture of surgical neck of left humerus, subsequent encounter for fracture with routine healing: Secondary | ICD-10-CM | POA: Diagnosis not present

## 2015-06-07 DIAGNOSIS — S82142D Displaced bicondylar fracture of left tibia, subsequent encounter for closed fracture with routine healing: Secondary | ICD-10-CM | POA: Diagnosis not present

## 2015-06-07 DIAGNOSIS — S42292P Other displaced fracture of upper end of left humerus, subsequent encounter for fracture with malunion: Secondary | ICD-10-CM | POA: Diagnosis not present

## 2015-06-07 DIAGNOSIS — Z4789 Encounter for other orthopedic aftercare: Secondary | ICD-10-CM | POA: Diagnosis not present

## 2015-06-07 DIAGNOSIS — Z7982 Long term (current) use of aspirin: Secondary | ICD-10-CM | POA: Diagnosis not present

## 2015-06-07 DIAGNOSIS — S42202P Unspecified fracture of upper end of left humerus, subsequent encounter for fracture with malunion: Secondary | ICD-10-CM | POA: Diagnosis not present

## 2015-07-09 ENCOUNTER — Encounter (HOSPITAL_COMMUNITY): Payer: Self-pay

## 2015-07-23 DIAGNOSIS — Z79899 Other long term (current) drug therapy: Secondary | ICD-10-CM | POA: Diagnosis not present

## 2015-07-23 DIAGNOSIS — I10 Essential (primary) hypertension: Secondary | ICD-10-CM | POA: Diagnosis not present

## 2015-07-23 DIAGNOSIS — E785 Hyperlipidemia, unspecified: Secondary | ICD-10-CM | POA: Diagnosis not present

## 2015-07-30 DIAGNOSIS — Z6827 Body mass index (BMI) 27.0-27.9, adult: Secondary | ICD-10-CM | POA: Diagnosis not present

## 2015-07-30 DIAGNOSIS — D509 Iron deficiency anemia, unspecified: Secondary | ICD-10-CM | POA: Diagnosis not present

## 2015-07-30 DIAGNOSIS — I10 Essential (primary) hypertension: Secondary | ICD-10-CM | POA: Diagnosis not present

## 2015-07-30 DIAGNOSIS — I639 Cerebral infarction, unspecified: Secondary | ICD-10-CM | POA: Diagnosis not present

## 2015-10-18 ENCOUNTER — Encounter (HOSPITAL_COMMUNITY): Payer: Self-pay | Admitting: Emergency Medicine

## 2015-10-18 ENCOUNTER — Emergency Department (HOSPITAL_COMMUNITY): Payer: Medicare Other

## 2015-10-18 ENCOUNTER — Emergency Department (HOSPITAL_COMMUNITY)
Admission: EM | Admit: 2015-10-18 | Discharge: 2015-10-18 | Disposition: A | Discharge status: Home / Self Care | Payer: Medicare Other | Attending: Emergency Medicine | Admitting: Emergency Medicine

## 2015-10-18 DIAGNOSIS — S62102A Fracture of unspecified carpal bone, left wrist, initial encounter for closed fracture: Secondary | ICD-10-CM

## 2015-10-18 DIAGNOSIS — M79642 Pain in left hand: Secondary | ICD-10-CM | POA: Diagnosis not present

## 2015-10-18 DIAGNOSIS — I10 Essential (primary) hypertension: Secondary | ICD-10-CM | POA: Diagnosis not present

## 2015-10-18 DIAGNOSIS — Y929 Unspecified place or not applicable: Secondary | ICD-10-CM | POA: Insufficient documentation

## 2015-10-18 DIAGNOSIS — S52615A Nondisplaced fracture of left ulna styloid process, initial encounter for closed fracture: Secondary | ICD-10-CM | POA: Insufficient documentation

## 2015-10-18 DIAGNOSIS — M4690 Unspecified inflammatory spondylopathy, site unspecified: Secondary | ICD-10-CM | POA: Insufficient documentation

## 2015-10-18 DIAGNOSIS — S52502A Unspecified fracture of the lower end of left radius, initial encounter for closed fracture: Secondary | ICD-10-CM | POA: Insufficient documentation

## 2015-10-18 DIAGNOSIS — W07XXXA Fall from chair, initial encounter: Secondary | ICD-10-CM | POA: Insufficient documentation

## 2015-10-18 DIAGNOSIS — M25462 Effusion, left knee: Secondary | ICD-10-CM | POA: Diagnosis not present

## 2015-10-18 DIAGNOSIS — Y939 Activity, unspecified: Secondary | ICD-10-CM | POA: Insufficient documentation

## 2015-10-18 DIAGNOSIS — Z7982 Long term (current) use of aspirin: Secondary | ICD-10-CM | POA: Insufficient documentation

## 2015-10-18 DIAGNOSIS — Y999 Unspecified external cause status: Secondary | ICD-10-CM | POA: Diagnosis not present

## 2015-10-18 DIAGNOSIS — M19049 Primary osteoarthritis, unspecified hand: Secondary | ICD-10-CM | POA: Insufficient documentation

## 2015-10-18 DIAGNOSIS — Z79899 Other long term (current) drug therapy: Secondary | ICD-10-CM | POA: Insufficient documentation

## 2015-10-18 DIAGNOSIS — M25512 Pain in left shoulder: Secondary | ICD-10-CM | POA: Diagnosis not present

## 2015-10-18 DIAGNOSIS — S4992XA Unspecified injury of left shoulder and upper arm, initial encounter: Secondary | ICD-10-CM | POA: Diagnosis not present

## 2015-10-18 DIAGNOSIS — S52622A Torus fracture of lower end of left ulna, initial encounter for closed fracture: Secondary | ICD-10-CM | POA: Diagnosis not present

## 2015-10-18 DIAGNOSIS — S6992XA Unspecified injury of left wrist, hand and finger(s), initial encounter: Secondary | ICD-10-CM | POA: Diagnosis present

## 2015-10-18 MED ORDER — HYDROCODONE-ACETAMINOPHEN 5-325 MG PO TABS: 1.0000 | ORAL_TABLET | ORAL | Status: DC | PRN

## 2015-10-18 NOTE — Discharge Instructions (Signed)
Wrist Fracture Follow-up with Dr. Aline Brochure on Monday or orthopedic doctor of your choice. Return to the ED if you develop worsening pain, numbness, tingling, fever or any other concern. A wrist fracture is a break or crack in one of the bones of your wrist. Your wrist is made up of eight small bones at the palm of your hand (carpal bones) and two long bones that make up your forearm (radius and ulna). CAUSES  A direct blow to the wrist.  Falling on an outstretched hand.  Trauma, such as a car accident or a fall. RISK FACTORS Risk factors for wrist fracture include:  Participating in contact and high-risk sports, such as skiing, biking, and ice skating.  Taking steroid medicines.  Smoking.  Being female.  Being Caucasian.  Drinking more than three alcoholic beverages per day.  Having low or lowered bone density (osteoporosis or osteopenia).  Age. Older adults have decreased bone density.  Women who have had menopause.  History of previous fractures. SIGNS AND SYMPTOMS Symptoms of wrist fractures include tenderness, bruising, and inflammation. Additionally, the wrist may hang in an odd position or appear deformed. DIAGNOSIS Diagnosis may include:  Physical exam.  X-ray. TREATMENT Treatment depends on many factors, including the nature and location of the fracture, your age, and your activity level. Treatment for wrist fracture can be nonsurgical or surgical. Nonsurgical Treatment A plaster cast or splint may be applied to your wrist if the bone is in a good position. If the fracture is not in good position, it may be necessary for your health care provider to realign it before applying a splint or cast. Usually, a cast or splint will be worn for several weeks. Surgical Treatment Sometimes the position of the bone is so far out of place that surgery is required to apply a device to hold it together as it heals. Depending on the fracture, there are a number of options for  holding the bone in place while it heals, such as a cast and metal pins. HOME CARE INSTRUCTIONS  Keep your injured wrist elevated and move your fingers as much as possible.  Do not put pressure on any part of your cast or splint. It may break.  Use a plastic bag to protect your cast or splint from water while bathing or showering. Do not lower your cast or splint into water.  Take medicines only as directed by your health care provider.  Keep your cast or splint clean and dry. If it becomes wet, damaged, or suddenly feels too tight, contact your health care provider right away.  Do not use any tobacco products including cigarettes, chewing tobacco, or electronic cigarettes. Tobacco can delay bone healing. If you need help quitting, ask your health care provider.  Keep all follow-up visits as directed by your health care provider. This is important.  Ask your health care provider if you should take supplements of calcium and vitamins C and D to promote bone healing. SEEK MEDICAL CARE IF:  Your cast or splint is damaged, breaks, or gets wet.  You have a fever.  You have chills.  You have continued severe pain or more swelling than you did before the cast was put on. SEEK IMMEDIATE MEDICAL CARE IF:  Your hand or fingernails on the injured arm turn blue or gray, or feel cold or numb.  You have decreased feeling in the fingers of your injured arm. MAKE SURE YOU:  Understand these instructions.  Will watch your condition.  Will  get help right away if you are not doing well or get worse.   This information is not intended to replace advice given to you by your health care provider. Make sure you discuss any questions you have with your health care provider.   Document Released: 01/29/2005 Document Revised: 01/10/2015 Document Reviewed: 05/09/2011 Elsevier Interactive Patient Education [employer].

## 2015-10-18 NOTE — ED Provider Notes (Addendum)
CSN: [id number]     Arrival date & time 10/18/15  1331 History   First MD Initiated Contact with Patient 10/18/15 1417     Chief Complaint  Patient presents with  . Wrist Pain     (Consider location/radiation/quality/duration/timing/severity/associated sxs/prior Treatment) HPI Comments: Patient reports falling out of a chair today and losing her balance. Is not certain how she fell but denies dizziness or passing out. Did not hit her head. He slipped out of the chair and injured her left wrist. There is pain, swelling and deformity. Denies hitting head or losing consciousness. Denies any other injury. No neck or back pain. No chest pain, abdominal pain or shortness of breath. Patient states she is still recovering from a left tibial plateau fracture and left humerus fracture last year that was repaired at [facility] Denies any injuries to these areas today. No numbness or tingling.  The history is provided by the patient.    Past Medical History  Diagnosis Date  . Hypertension   . Arthritis     hands and back   Past Surgical History  Procedure Laterality Date  . Joint replacement  2008    left hip  . Colonoscopy N/A 09/08/2012    Procedure: COLONOSCOPY;  Surgeon: Rogene Houston, MD;  Location[facility];  Service: Endoscopy;  Laterality: N/A;  730-moved to 830 Ann to notify pt  . Total hip arthroplasty Left Left Hip Replacement   Family History  Problem Relation Age of Onset  . Arthritis Mother   . Diabetes type II Father   . CAD Father   . Colon cancer Sister   . Hypertension Brother    Social History  Substance Use Topics  . Smoking status: Never Smoker   . Smokeless tobacco: None  . Alcohol Use: Yes     Comment: rare   OB History    No data available     Review of Systems  Constitutional: Negative for fever, activity change and appetite change.  HENT: Negative for congestion and rhinorrhea.   Respiratory: Negative for cough, chest tightness and shortness of  breath.   Cardiovascular: Negative for chest pain and leg swelling.  Gastrointestinal: Negative for nausea, vomiting and abdominal pain.  Genitourinary: Negative for dysuria and hematuria.  Musculoskeletal: Positive for myalgias and arthralgias. Negative for back pain and neck pain.  Skin: Negative for rash and wound.  Neurological: Negative for dizziness and headaches.  A complete 10 system review of systems was obtained and all systems are negative except as noted in the HPI and PMH.      Allergies  Review of patient's allergies indicates no known allergies.  Home Medications   Prior to Admission medications   Medication Sig Start Date End Date Taking? Authorizing Provider  aspirin EC 81 MG tablet Take 81 mg by mouth daily.   Yes Historical Provider, MD  atorvastatin (LIPITOR) 40 MG tablet Take 40 mg by mouth daily.   Yes Historical Provider, MD  calcium citrate (CALCITRATE - DOSED IN MG ELEMENTAL CALCIUM) 950 MG tablet Take 200 mg of elemental calcium by mouth daily.   Yes Historical Provider, MD  lisinopril (PRINIVIL,ZESTRIL) 10 MG tablet Take 10 mg by mouth daily.   Yes Historical Provider, MD  losartan (COZAAR) 50 MG tablet Take 50 mg by mouth daily.   Yes Historical Provider, MD  HYDROcodone-acetaminophen (NORCO/VICODIN) 5-325 MG tablet Take 1 tablet by mouth every 4 (four) hours as needed. 10/18/15   Ezequiel Essex, MD  BP 126/72 mmHg  Pulse 84  Temp(Src) 98.6 F (37 C) (Oral)  Resp 16  Ht 5\' 3"  (1.6 m)  Wt 150 lb (68.04 kg)  BMI 26.58 kg/m2  SpO2 100% Physical Exam  Constitutional: She is oriented to person, place, and time. She appears well-developed and well-nourished. No distress.  HENT:  Head: Normocephalic and atraumatic.  Mouth/Throat: Oropharynx is clear and moist. No oropharyngeal exudate.  Eyes: Conjunctivae and EOM are normal. Pupils are equal, round, and reactive to light.  Neck: Normal range of motion. Neck supple.  No C spine pain  Cardiovascular:  Normal rate, regular rhythm, normal heart sounds and intact distal pulses.   No murmur heard. Pulmonary/Chest: Effort normal and breath sounds normal. No respiratory distress. She exhibits no tenderness.  Abdominal: Soft. There is no tenderness. There is no rebound and no guarding.  Musculoskeletal: Normal range of motion. She exhibits edema and tenderness.  Swelling and deformity to L wrist.  Intact radial pulse.  Cardinal hand movements intact. FROM elbow, shoulder L knee nontender No proximal humerus fracture  No T or L spine tenderness  Neurological: She is alert and oriented to person, place, and time. No cranial nerve deficit. She exhibits normal muscle tone. Coordination normal.  No ataxia on finger to nose bilaterally. No pronator drift. 5/5 strength throughout. CN 2-12 intact.Equal grip strength. Sensation intact.   Skin: Skin is warm.  Psychiatric: She has a normal mood and affect. Her behavior is normal.  Nursing note and vitals reviewed.   ED Course  Procedures (including critical care time) Labs Review Labs Reviewed - No data to display  Imaging Review Dg Wrist Complete Left  10/18/2015  CLINICAL DATA:  [place] at home today, tried to catch herself with her LEFT hand, having pain and swelling LEFT wrist, known arthritis, initial encounter EXAM: LEFT WRIST - COMPLETE 3+ VIEW COMPARISON:  None FINDINGS: Diffuse osseous demineralization. Transverse metaphyseal fracture distal LEFT radius with mild dorsal displacement and dorsal tilt of the distal radial articular surface. No definite intra-articular extension. Nondisplaced ulnar styloid fracture. Degenerative changes at midcarpal joint, first CMC joint, and STT joint with joint space narrowing and minimal spur formation. No additional fracture or dislocation. Minimal chondrocalcinosis at the TFCC. Regional soft tissue swelling at LEFT wrist and distal forearm. IMPRESSION: Nondisplaced ulnar styloid fracture. Mildly displaced and  angulated distal LEFT radial metaphysis seal fracture. Osseous demineralization with scattered degenerative changes as above. Electronically Signed   By: Lavonia Dana M.D.   On: 10/18/2015 13:58   Dg Shoulder Left  10/18/2015  CLINICAL DATA:  Recent fall with shoulder pain, initial encounter EXAM: LEFT SHOULDER - 2+ VIEW COMPARISON:  11/30/2014 FINDINGS: There are changes in the proximal left humerus consistent with prior fracture and some healing. No definitive acute component is seen. The underlying bony thorax is within normal limits. IMPRESSION: Prior healed fracture in the left humerus. No definitive acute abnormality is seen. Electronically Signed   By: Inez Catalina M.D.   On: 10/18/2015 15:17   Dg Knee Complete 4 Views Left  10/18/2015  CLINICAL DATA:  Fall today, left knee pain, left shoulder pain EXAM: LEFT KNEE - COMPLETE 4+ VIEW COMPARISON:  11/30/2014 FINDINGS: Five views of the left knee submitted. No acute fracture or subluxation. There are postsurgical changes with a lateral metallic fixation plate and multiple fixation screws in proximal tibia. The alignment is preserved. Mild narrowing of joint space. There is narrowing of patellofemoral joint space. Small joint effusion. IMPRESSION: No acute  fracture or subluxation. Postsurgical changes and degenerative changes as described above. Small joint effusion. Electronically Signed   By: Lahoma Crocker M.D.   On: 10/18/2015 15:21   Dg Hand Complete Left  10/18/2015  CLINICAL DATA:  Fall today, left shoulder pain, left hand pain EXAM: LEFT HAND - COMPLETE 3+ VIEW COMPARISON:  None. FINDINGS: Three views of the left hand submitted. No acute fracture or subluxation. Extensive degenerative changes are noted distal interphalangeal joint first second third and fifth finger. Degenerative changes are noted proximal interphalangeal joint second third fourth and fifth finger. Degenerative changes are noted first carpometacarpal joint degenerative changes are  noted radiocarpal joint. There is nondisplaced fracture of the ulnar styloid. Degenerative changes are noted second and fourth metacarpophalangeal joint. IMPRESSION: No hand fracture or subluxation. Degenerative changes as described above. There is nondisplaced fracture of the ulnar styloid. Electronically Signed   By: Lahoma Crocker M.D.   On: 10/18/2015 15:19   I have personally reviewed and evaluated these images and lab results as part of my medical decision-making.   EKG Interpretation None      MDM   Final diagnoses:  Left wrist fracture, closed, initial encounter  Fall with L wrist injury.  Did not hit head or LOC. Neurovascularly intact. Patient not anticoagulant. Denies any head or neck trauma. No headache. Complains only of left wrist pain. No dizziness or lightheadedness.  X-ray shows ulnar styloid and radial fracture as above. Previous fractures are stable and healing.  D/w Dr. Aline Brochure who reviewed x-ray images. He states wrist should be splinted in this position and does not need any reduction or manipulation. She is neurovascularly intact. He recommends sugar tong splint and follow-up in the office on Monday.  Patient does not wish to see Dr. Aline Brochure. She wishes to see her orthopedist at [facility]. Discussed with patient that she should see Dr. Aline Brochure or her orthopedist on Monday. Return precautions discussed.   Ezequiel Essex, MD 10/18/15 [id number]  Ezequiel Essex, MD 10/18/15 2209

## 2015-10-18 NOTE — ED Notes (Addendum)
Pt reports falling today after losing balance, denies dizziness.  Pt went to sit down in chair that is a raised chair and when pt tried to slide over in chair pt slipped out of chair.  Pt has left arm/wrist pain.  Pt has deformity of left wrist, pulses and sensation intact. Pt denies loss of consciousness or hitting head. Ice given to pt.

## 2015-10-19 ENCOUNTER — Telehealth: Payer: Self-pay | Admitting: Orthopedic Surgery

## 2015-10-19 NOTE — Telephone Encounter (Signed)
-----   Message from Carole Civil, MD sent at 10/18/2015  3:44 PM EDT ----- Call sched appt for mon

## 2015-10-19 NOTE — Telephone Encounter (Signed)
Contacted patient to offer appointment following Emergency Room visit per Dr Ruthe Mannan message and per chart notes for problem of left wrist fracture.  There was no answer or answer machine at home ph# [phone].  Called designated contact, son Carol Gutierrez, cell# [phone], who relates that they are trying to schedule with orthopaedic surgeon in [place].  I relayed that we are happy to see her here.  States will call back if needs to schedule with our office.

## 2015-10-22 DIAGNOSIS — S52502A Unspecified fracture of the lower end of left radius, initial encounter for closed fracture: Secondary | ICD-10-CM | POA: Diagnosis not present

## 2015-10-22 DIAGNOSIS — Z7982 Long term (current) use of aspirin: Secondary | ICD-10-CM | POA: Diagnosis not present

## 2015-10-22 DIAGNOSIS — S52592A Other fractures of lower end of left radius, initial encounter for closed fracture: Secondary | ICD-10-CM | POA: Diagnosis not present

## 2015-10-22 DIAGNOSIS — S52615A Nondisplaced fracture of left ulna styloid process, initial encounter for closed fracture: Secondary | ICD-10-CM | POA: Diagnosis not present

## 2015-11-12 DIAGNOSIS — E782 Mixed hyperlipidemia: Secondary | ICD-10-CM | POA: Diagnosis not present

## 2015-11-12 DIAGNOSIS — S52502D Unspecified fracture of the lower end of left radius, subsequent encounter for closed fracture with routine healing: Secondary | ICD-10-CM | POA: Diagnosis not present

## 2015-11-12 DIAGNOSIS — Z7982 Long term (current) use of aspirin: Secondary | ICD-10-CM | POA: Diagnosis not present

## 2015-11-12 DIAGNOSIS — Z79899 Other long term (current) drug therapy: Secondary | ICD-10-CM | POA: Diagnosis not present

## 2015-11-12 DIAGNOSIS — Z8673 Personal history of transient ischemic attack (TIA), and cerebral infarction without residual deficits: Secondary | ICD-10-CM | POA: Diagnosis not present

## 2015-11-12 DIAGNOSIS — Z888 Allergy status to other drugs, medicaments and biological substances status: Secondary | ICD-10-CM | POA: Diagnosis not present

## 2015-11-12 DIAGNOSIS — I10 Essential (primary) hypertension: Secondary | ICD-10-CM | POA: Diagnosis not present

## 2015-11-12 DIAGNOSIS — S52612D Displaced fracture of left ulna styloid process, subsequent encounter for closed fracture with routine healing: Secondary | ICD-10-CM | POA: Diagnosis not present

## 2015-11-19 DIAGNOSIS — M810 Age-related osteoporosis without current pathological fracture: Secondary | ICD-10-CM | POA: Diagnosis not present

## 2015-11-19 DIAGNOSIS — I10 Essential (primary) hypertension: Secondary | ICD-10-CM | POA: Diagnosis not present

## 2015-11-22 DIAGNOSIS — H40013 Open angle with borderline findings, low risk, bilateral: Secondary | ICD-10-CM | POA: Diagnosis not present

## 2015-11-22 DIAGNOSIS — H01003 Unspecified blepharitis right eye, unspecified eyelid: Secondary | ICD-10-CM | POA: Diagnosis not present

## 2015-11-22 DIAGNOSIS — H00025 Hordeolum internum left lower eyelid: Secondary | ICD-10-CM | POA: Diagnosis not present

## 2015-11-22 DIAGNOSIS — H04123 Dry eye syndrome of bilateral lacrimal glands: Secondary | ICD-10-CM | POA: Diagnosis not present

## 2015-12-03 DIAGNOSIS — I10 Essential (primary) hypertension: Secondary | ICD-10-CM | POA: Diagnosis not present

## 2015-12-03 DIAGNOSIS — E784 Other hyperlipidemia: Secondary | ICD-10-CM | POA: Diagnosis not present

## 2015-12-03 DIAGNOSIS — S59202D Unspecified physeal fracture of lower end of radius, left arm, subsequent encounter for fracture with routine healing: Secondary | ICD-10-CM | POA: Diagnosis not present

## 2015-12-03 DIAGNOSIS — Z79899 Other long term (current) drug therapy: Secondary | ICD-10-CM | POA: Diagnosis not present

## 2015-12-03 DIAGNOSIS — Z7982 Long term (current) use of aspirin: Secondary | ICD-10-CM | POA: Diagnosis not present

## 2015-12-03 DIAGNOSIS — Z888 Allergy status to other drugs, medicaments and biological substances status: Secondary | ICD-10-CM | POA: Diagnosis not present

## 2015-12-03 DIAGNOSIS — S52612D Displaced fracture of left ulna styloid process, subsequent encounter for closed fracture with routine healing: Secondary | ICD-10-CM | POA: Diagnosis not present

## 2015-12-03 DIAGNOSIS — S52592D Other fractures of lower end of left radius, subsequent encounter for closed fracture with routine healing: Secondary | ICD-10-CM | POA: Diagnosis not present

## 2015-12-03 DIAGNOSIS — Z8673 Personal history of transient ischemic attack (TIA), and cerebral infarction without residual deficits: Secondary | ICD-10-CM | POA: Diagnosis not present

## 2016-01-14 DIAGNOSIS — E782 Mixed hyperlipidemia: Secondary | ICD-10-CM | POA: Diagnosis not present

## 2016-01-14 DIAGNOSIS — S52502D Unspecified fracture of the lower end of left radius, subsequent encounter for closed fracture with routine healing: Secondary | ICD-10-CM | POA: Diagnosis not present

## 2016-01-14 DIAGNOSIS — S52612D Displaced fracture of left ulna styloid process, subsequent encounter for closed fracture with routine healing: Secondary | ICD-10-CM | POA: Diagnosis not present

## 2016-01-14 DIAGNOSIS — S52552D Other extraarticular fracture of lower end of left radius, subsequent encounter for closed fracture with routine healing: Secondary | ICD-10-CM | POA: Diagnosis not present

## 2016-01-14 DIAGNOSIS — Z7902 Long term (current) use of antithrombotics/antiplatelets: Secondary | ICD-10-CM | POA: Diagnosis not present

## 2016-01-14 DIAGNOSIS — M85822 Other specified disorders of bone density and structure, left upper arm: Secondary | ICD-10-CM | POA: Diagnosis not present

## 2016-01-14 DIAGNOSIS — Z7982 Long term (current) use of aspirin: Secondary | ICD-10-CM | POA: Diagnosis not present

## 2016-01-14 DIAGNOSIS — Z8673 Personal history of transient ischemic attack (TIA), and cerebral infarction without residual deficits: Secondary | ICD-10-CM | POA: Diagnosis not present

## 2016-01-14 DIAGNOSIS — I10 Essential (primary) hypertension: Secondary | ICD-10-CM | POA: Diagnosis not present

## 2016-01-14 DIAGNOSIS — Z888 Allergy status to other drugs, medicaments and biological substances status: Secondary | ICD-10-CM | POA: Diagnosis not present

## 2016-01-14 DIAGNOSIS — Z79899 Other long term (current) drug therapy: Secondary | ICD-10-CM | POA: Diagnosis not present

## 2016-05-02 DIAGNOSIS — M810 Age-related osteoporosis without current pathological fracture: Secondary | ICD-10-CM | POA: Diagnosis not present

## 2016-05-02 DIAGNOSIS — I10 Essential (primary) hypertension: Secondary | ICD-10-CM | POA: Diagnosis not present

## 2016-05-02 DIAGNOSIS — Z79899 Other long term (current) drug therapy: Secondary | ICD-10-CM | POA: Diagnosis not present

## 2016-05-06 DIAGNOSIS — Z6829 Body mass index (BMI) 29.0-29.9, adult: Secondary | ICD-10-CM | POA: Diagnosis not present

## 2016-05-06 DIAGNOSIS — Z23 Encounter for immunization: Secondary | ICD-10-CM | POA: Diagnosis not present

## 2016-05-06 DIAGNOSIS — Z8673 Personal history of transient ischemic attack (TIA), and cerebral infarction without residual deficits: Secondary | ICD-10-CM | POA: Diagnosis not present

## 2016-05-06 DIAGNOSIS — I10 Essential (primary) hypertension: Secondary | ICD-10-CM | POA: Diagnosis not present

## 2016-05-06 DIAGNOSIS — E785 Hyperlipidemia, unspecified: Secondary | ICD-10-CM | POA: Diagnosis not present

## 2016-05-09 ENCOUNTER — Other Ambulatory Visit (HOSPITAL_COMMUNITY): Payer: Self-pay | Admitting: Internal Medicine

## 2016-05-09 DIAGNOSIS — Z1231 Encounter for screening mammogram for malignant neoplasm of breast: Secondary | ICD-10-CM

## 2016-05-12 ENCOUNTER — Ambulatory Visit (HOSPITAL_COMMUNITY)
Admission: RE | Admit: 2016-05-12 | Discharge: 2016-05-12 | Disposition: A | Discharge status: Home / Self Care | Payer: Medicare Other | Source: Ambulatory Visit | Attending: Internal Medicine | Admitting: Internal Medicine

## 2016-05-12 DIAGNOSIS — Z1231 Encounter for screening mammogram for malignant neoplasm of breast: Secondary | ICD-10-CM | POA: Diagnosis not present

## 2016-05-13 DIAGNOSIS — H26492 Other secondary cataract, left eye: Secondary | ICD-10-CM | POA: Diagnosis not present

## 2016-05-13 DIAGNOSIS — Z961 Presence of intraocular lens: Secondary | ICD-10-CM | POA: Diagnosis not present

## 2016-05-13 DIAGNOSIS — H04123 Dry eye syndrome of bilateral lacrimal glands: Secondary | ICD-10-CM | POA: Diagnosis not present

## 2016-05-13 DIAGNOSIS — H40013 Open angle with borderline findings, low risk, bilateral: Secondary | ICD-10-CM | POA: Diagnosis not present

## 2016-06-13 IMAGING — DX DG HIP (WITH OR WITHOUT PELVIS) 2-3V*L*
3 series · 3 of 3 positions shown · non-contrast
Comparison: None.

CLINICAL DATA: Status post fall today with left hip pain

EXAM:
DG HIP (WITH OR WITHOUT PELVIS) 2-3V LEFT

[pelvis ap]
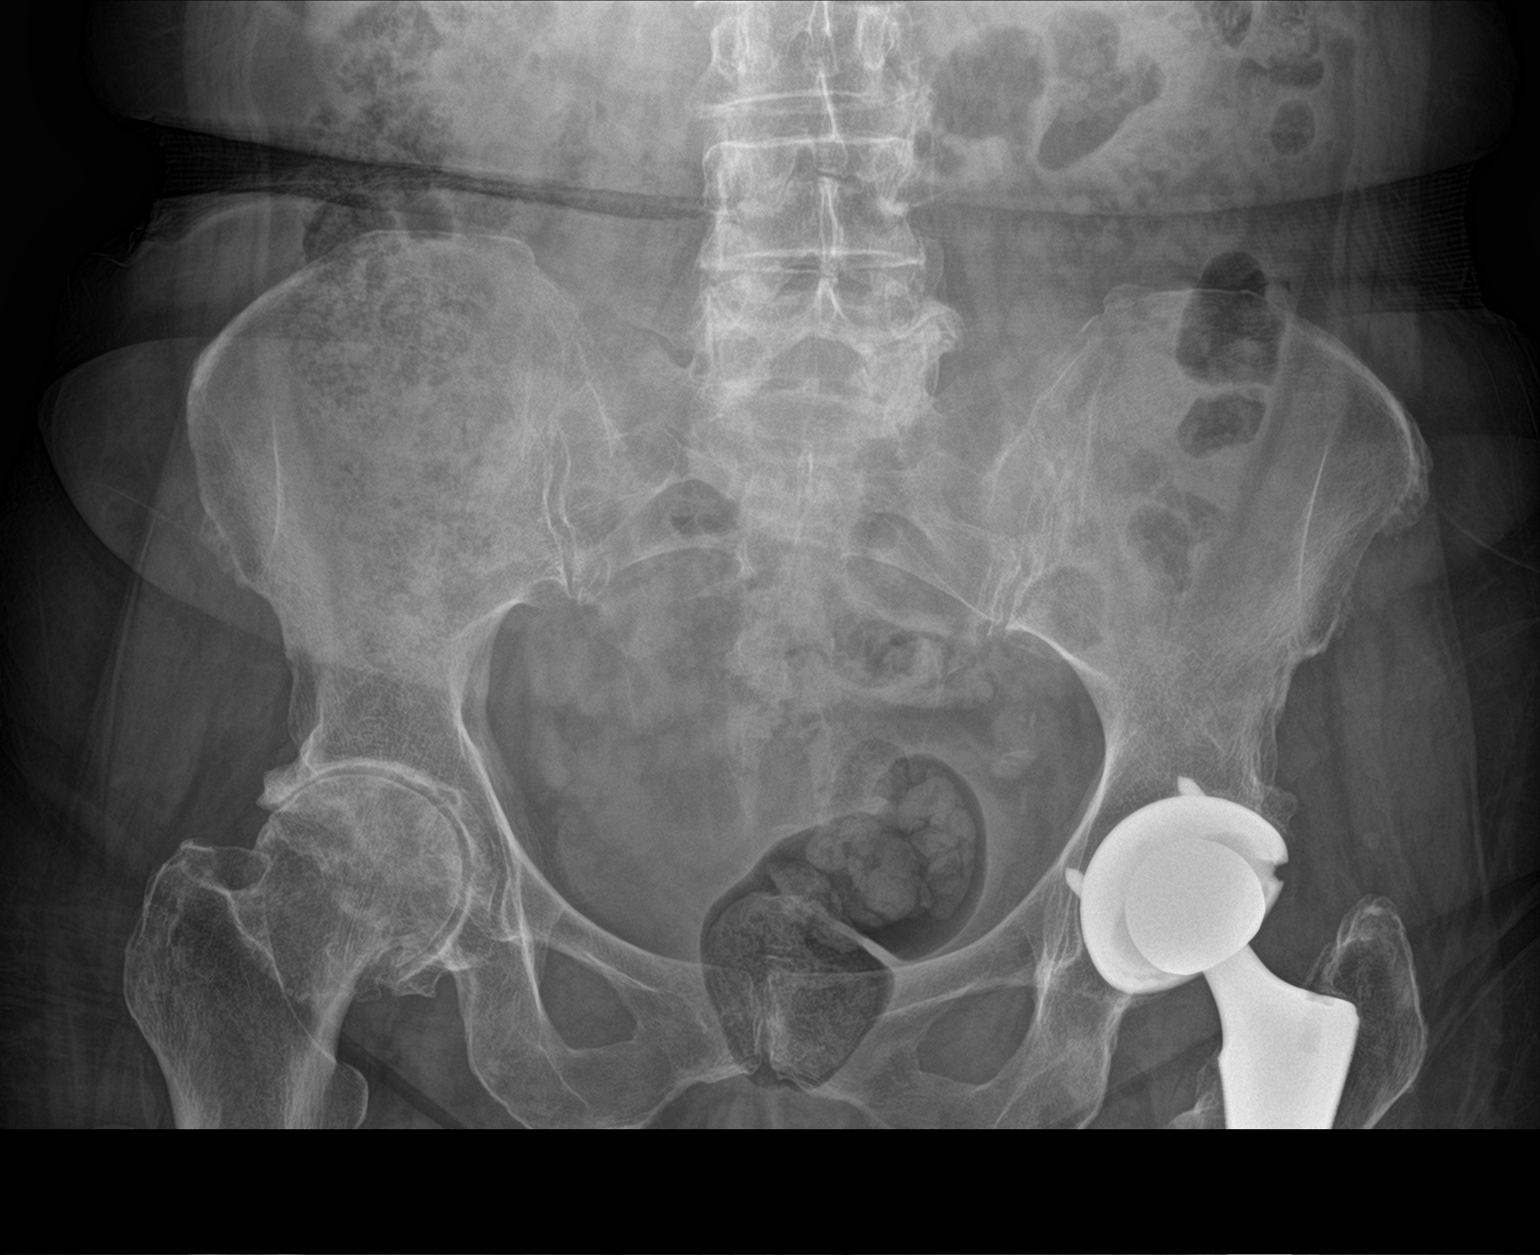

[hip ap]
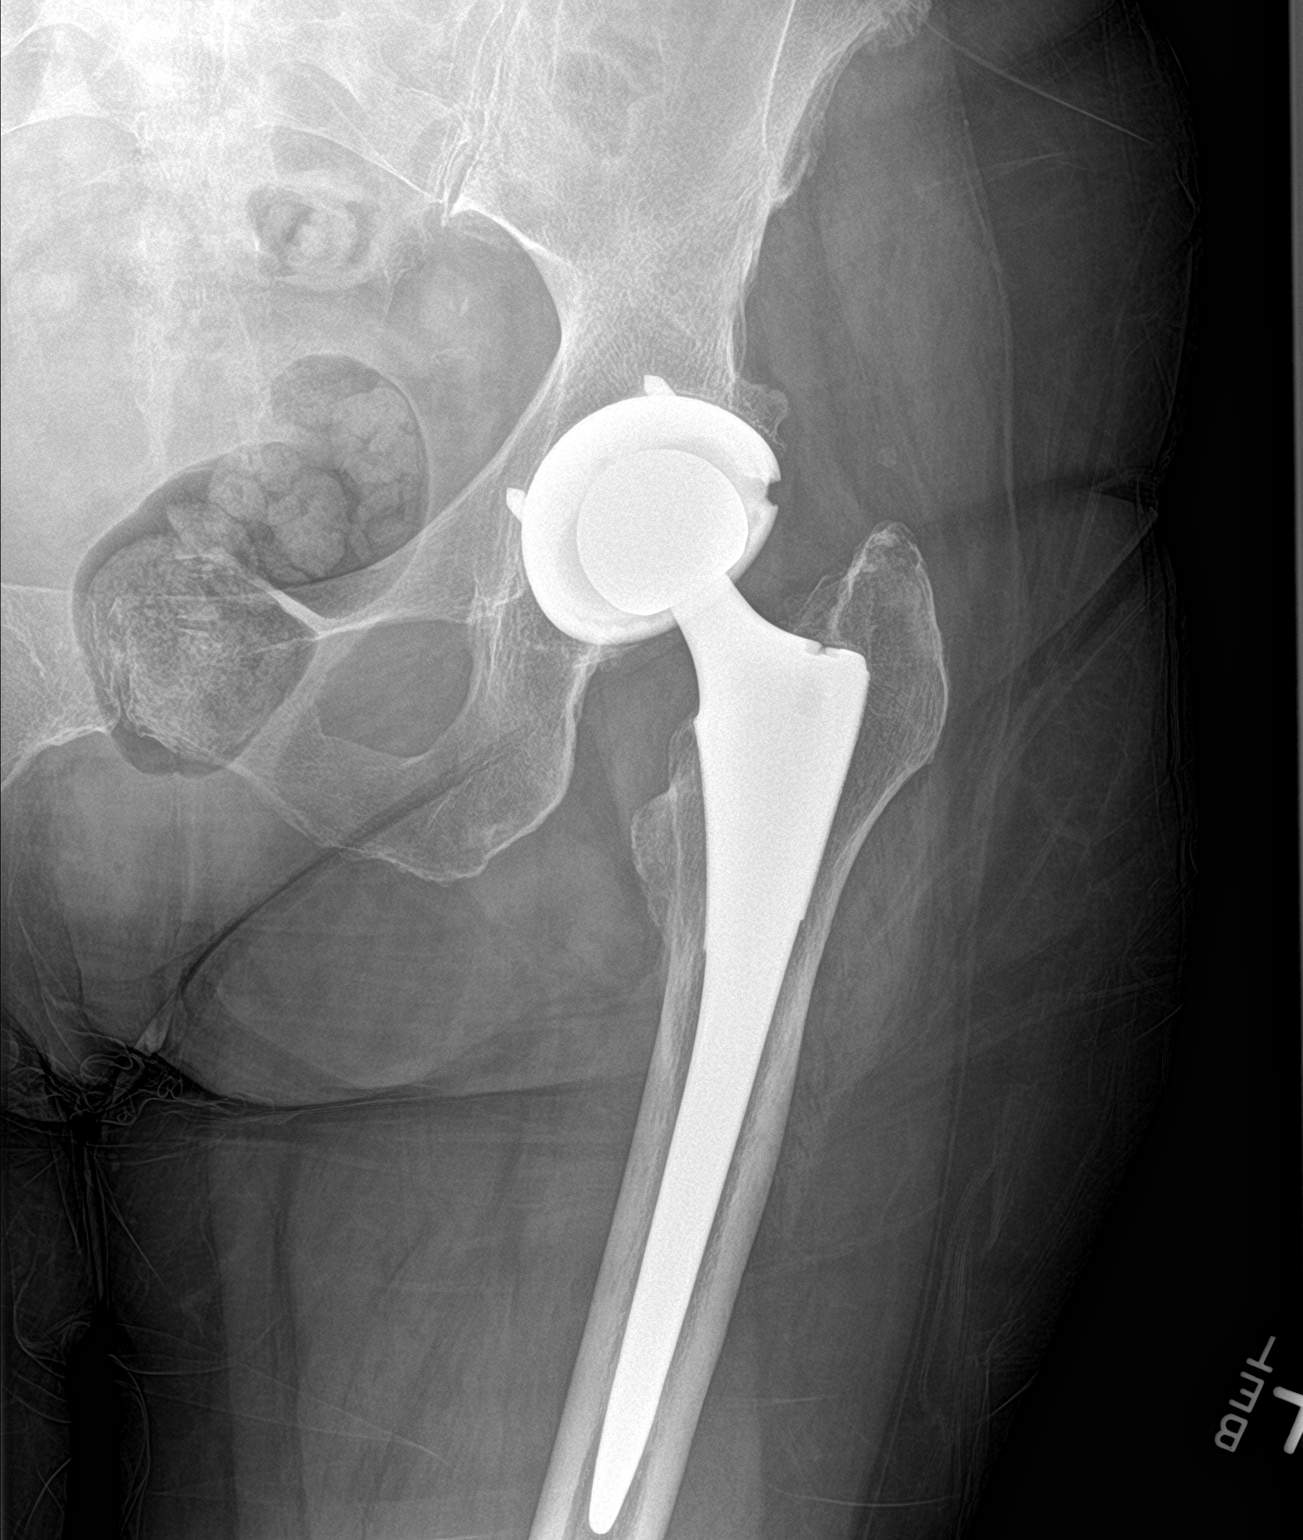

[hip lat]
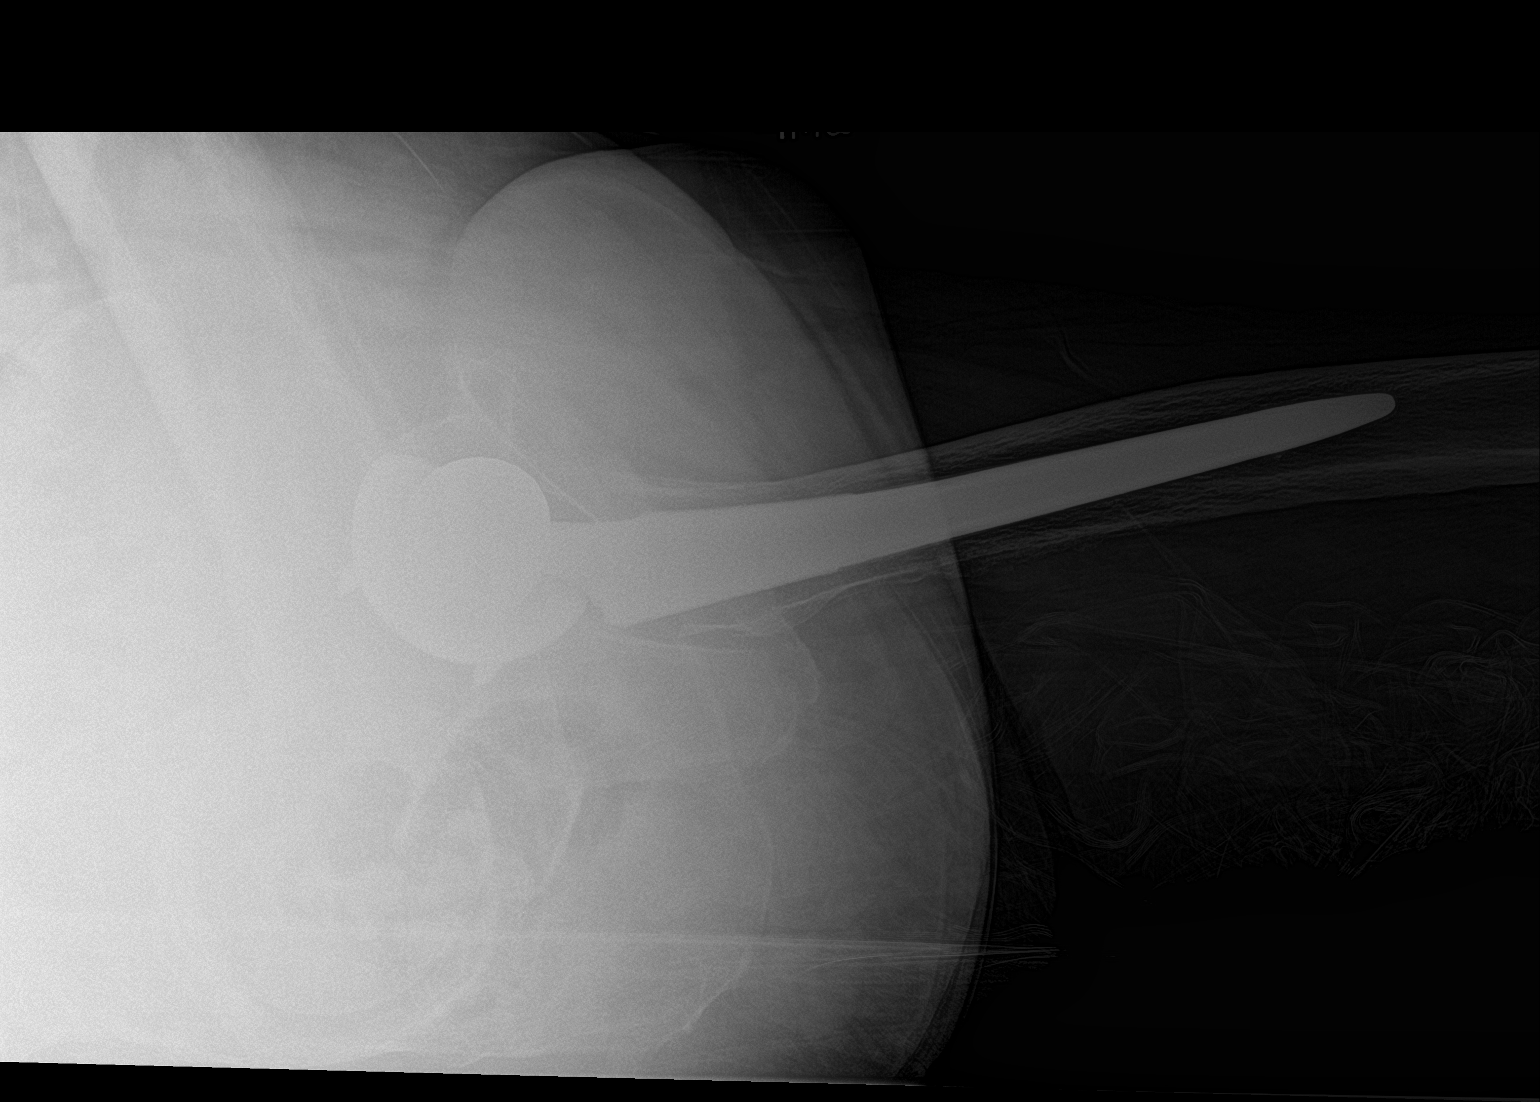

[3 of 3 positions shown; findings below may reference images not displayed]

FINDINGS: There is no evidence of hip fracture or dislocation. Left hip
replacement is identified. There are degenerative joint changes of
right hip with marked narrowed joint space and osteophyte formation.
IMPRESSION: No acute fracture or dislocation.

## 2016-06-13 IMAGING — DX DG TIBIA/FIBULA 2V*L*
2 series · 2 of 2 positions shown · non-contrast
Comparison: None.

CLINICAL DATA: Status post fall today with left knee pain

EXAM:
LEFT TIBIA AND FIBULA - 2 VIEW

[tibia ap]
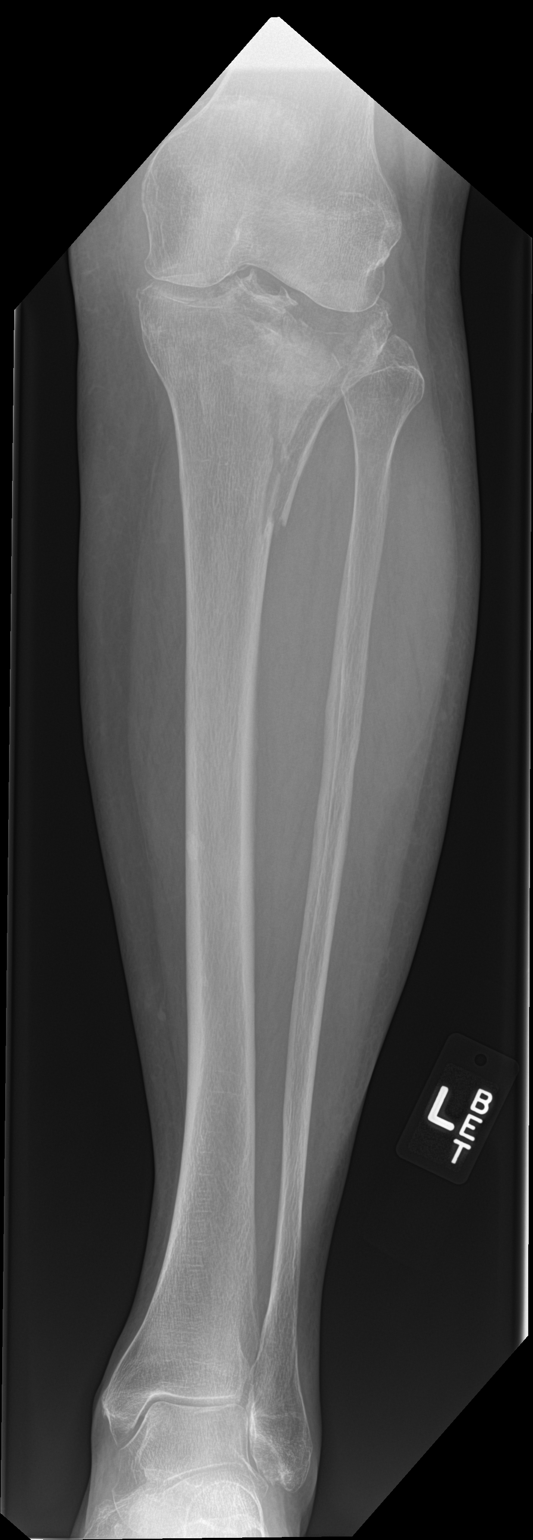

[tibia lat]
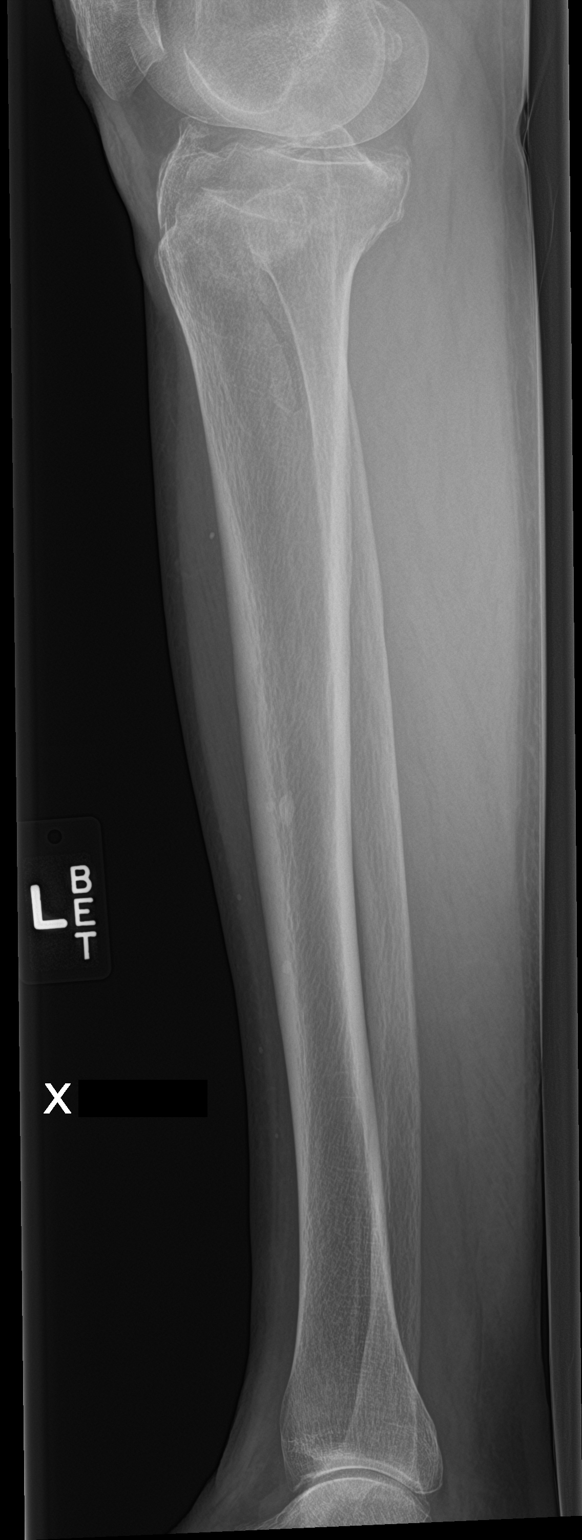

[2 of 2 positions shown; findings below may reference images not displayed]

FINDINGS: There is comminuted intra-articular displaced fracture of the
proximal medial tibia. There is fracture of the distal fibula.
IMPRESSION: Comminuted displaced intra-articular fracture of the proximal medial
tibia.

Fracture of distal fibula.

## 2016-06-13 IMAGING — DX DG HUMERUS 2V *L*
2 series · 2 of 2 positions shown · non-contrast
Comparison: None.

CLINICAL DATA: Tripped and fell onto a sidewalk today.

EXAM:
LEFT HUMERUS - 2+ VIEW

[humerus ap]
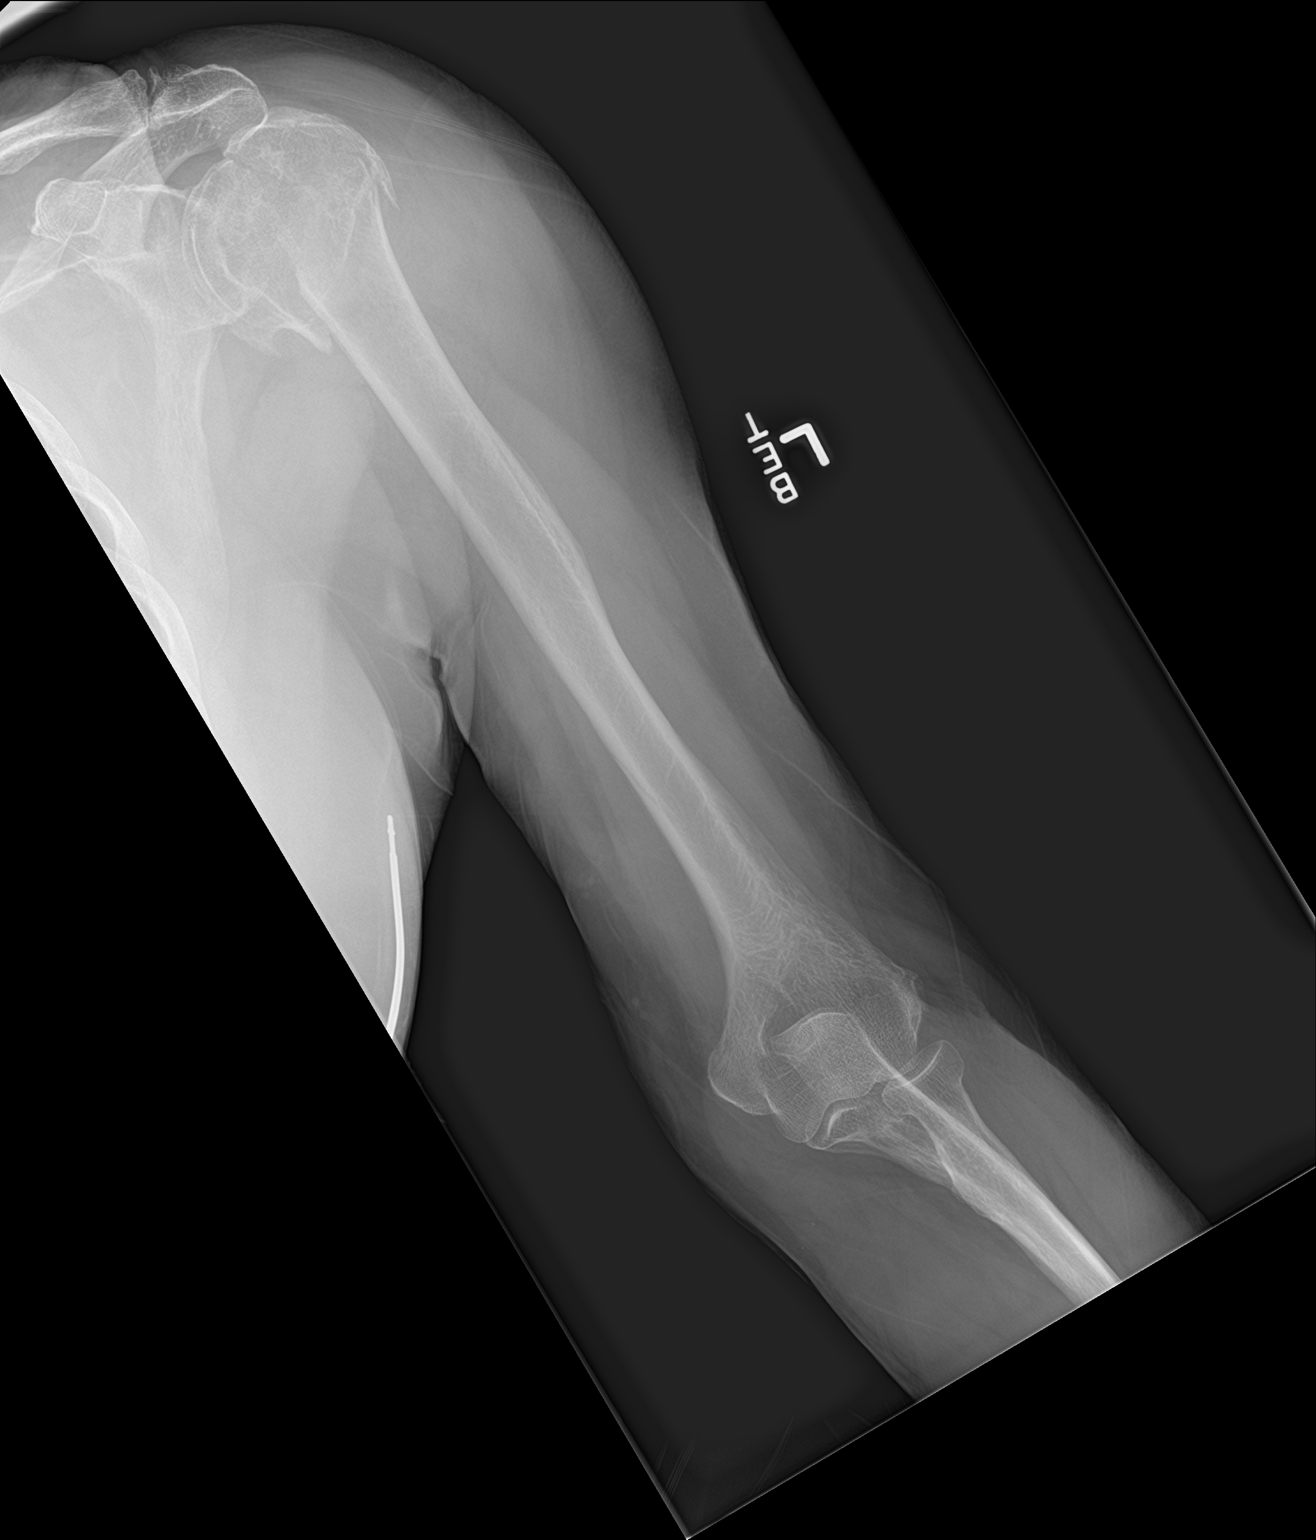

[humerus lat]
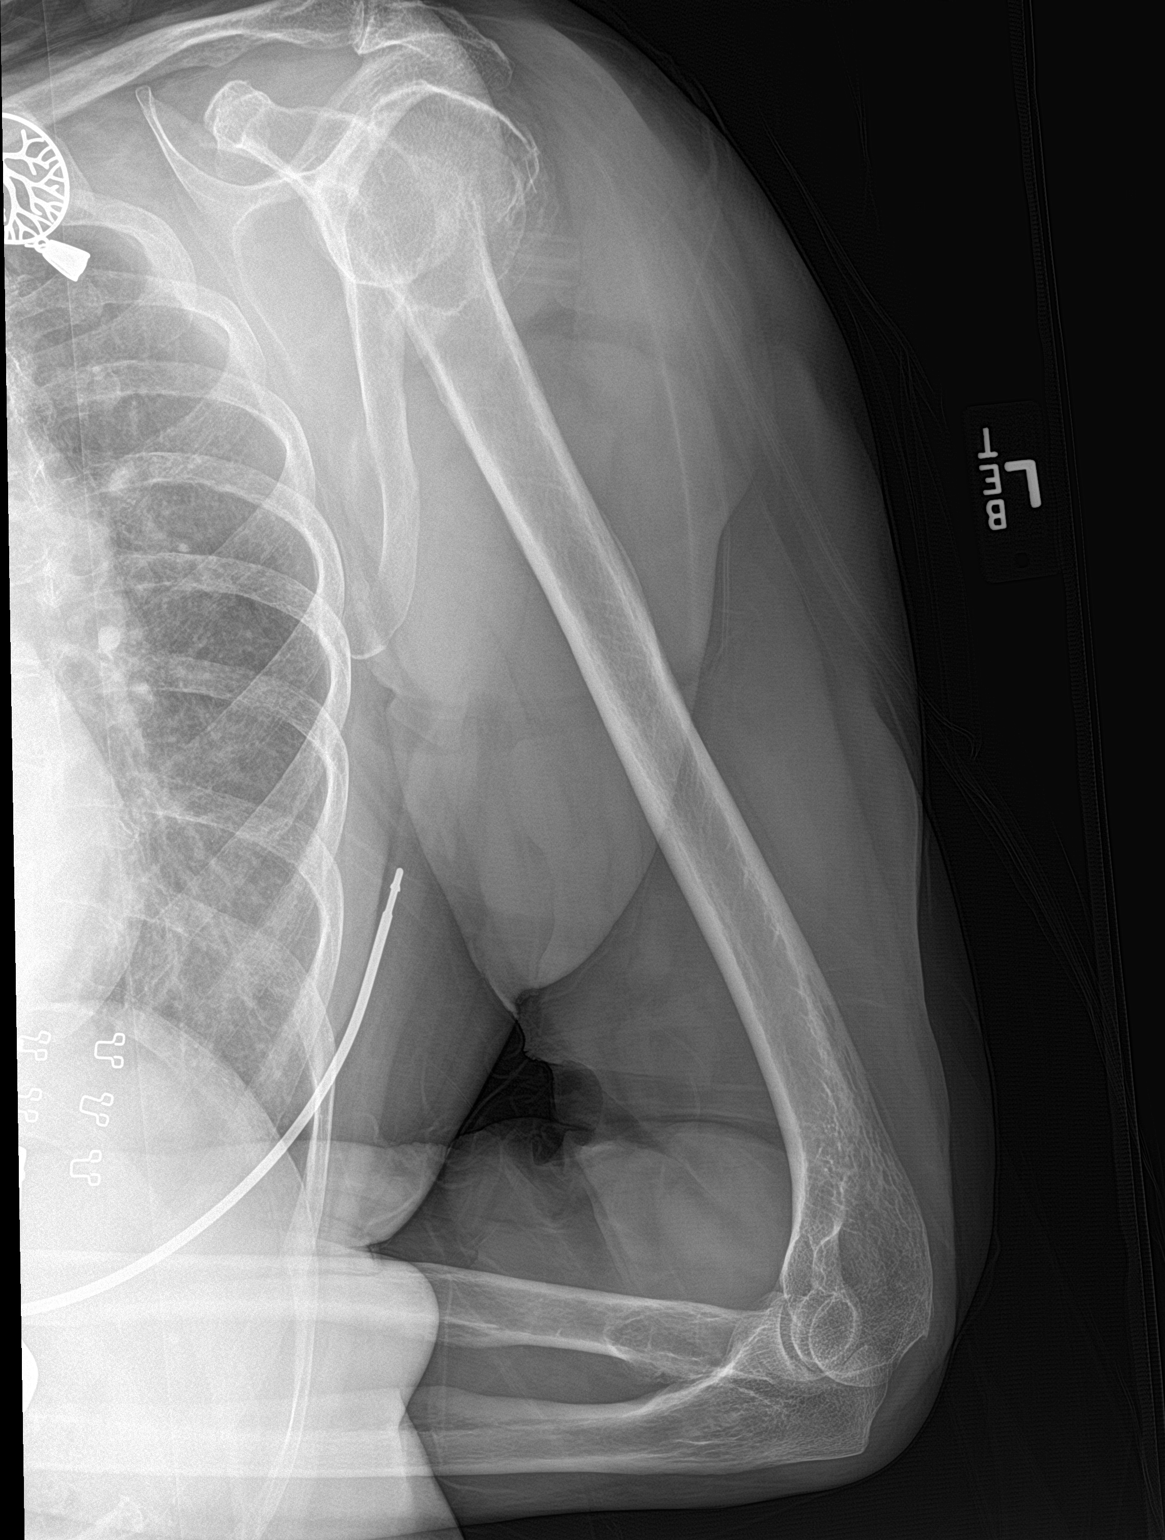

[2 of 2 positions shown; findings below may reference images not displayed]

FINDINGS: There is a comminuted proximal left humeral fracture, comprised of
an oblique transverse component across the surgical neck and
vertical components across the greater tuberosity and also up to the
articular surface where there is a mild step-off. There is no
dislocation. There is no bone lesion or bony destruction to suggest
a pathologic basis for the fracture. There is mild impaction and
slight displacement about the fractures.
IMPRESSION: Comminuted proximal left humeral fracture.  No dislocation.

## 2016-07-21 DIAGNOSIS — M1611 Unilateral primary osteoarthritis, right hip: Secondary | ICD-10-CM | POA: Diagnosis not present

## 2016-07-21 DIAGNOSIS — M25551 Pain in right hip: Secondary | ICD-10-CM | POA: Diagnosis not present

## 2016-07-21 DIAGNOSIS — M7061 Trochanteric bursitis, right hip: Secondary | ICD-10-CM | POA: Diagnosis not present

## 2016-07-21 DIAGNOSIS — Z96641 Presence of right artificial hip joint: Secondary | ICD-10-CM | POA: Diagnosis not present

## 2016-07-21 DIAGNOSIS — Z8673 Personal history of transient ischemic attack (TIA), and cerebral infarction without residual deficits: Secondary | ICD-10-CM | POA: Diagnosis not present

## 2016-10-21 DIAGNOSIS — H6990 Unspecified Eustachian tube disorder, unspecified ear: Secondary | ICD-10-CM | POA: Diagnosis not present

## 2016-10-22 DIAGNOSIS — Z96642 Presence of left artificial hip joint: Secondary | ICD-10-CM | POA: Diagnosis not present

## 2016-10-22 DIAGNOSIS — M25551 Pain in right hip: Secondary | ICD-10-CM | POA: Diagnosis not present

## 2016-10-22 DIAGNOSIS — M1611 Unilateral primary osteoarthritis, right hip: Secondary | ICD-10-CM | POA: Diagnosis not present

## 2016-10-29 DIAGNOSIS — H26492 Other secondary cataract, left eye: Secondary | ICD-10-CM | POA: Diagnosis not present

## 2016-10-29 DIAGNOSIS — H524 Presbyopia: Secondary | ICD-10-CM | POA: Diagnosis not present

## 2016-10-29 DIAGNOSIS — H40013 Open angle with borderline findings, low risk, bilateral: Secondary | ICD-10-CM | POA: Diagnosis not present

## 2016-11-11 ENCOUNTER — Ambulatory Visit (HOSPITAL_COMMUNITY): Payer: Medicare Other | Admitting: Physical Therapy

## 2016-11-17 DIAGNOSIS — M1611 Unilateral primary osteoarthritis, right hip: Secondary | ICD-10-CM | POA: Diagnosis not present

## 2016-11-18 ENCOUNTER — Ambulatory Visit (HOSPITAL_COMMUNITY): Payer: Medicare Other | Attending: Student | Admitting: Physical Therapy

## 2016-11-18 DIAGNOSIS — R269 Unspecified abnormalities of gait and mobility: Secondary | ICD-10-CM | POA: Insufficient documentation

## 2016-11-18 DIAGNOSIS — R262 Difficulty in walking, not elsewhere classified: Secondary | ICD-10-CM | POA: Insufficient documentation

## 2016-11-18 DIAGNOSIS — M25551 Pain in right hip: Secondary | ICD-10-CM | POA: Insufficient documentation

## 2016-11-18 DIAGNOSIS — M6281 Muscle weakness (generalized): Secondary | ICD-10-CM | POA: Insufficient documentation

## 2016-11-18 NOTE — Therapy (Signed)
[facility] [place], [place], [address] Phone: [phone]   Fax:  [phone]  Physical Therapy Evaluation  Patient Details  Name: Carol Gutierrez MRN: [id number] Date of Birth: 08/03/1935 Referring Provider: Kathe Gutierrez  Encounter Date: 11/18/2016      PT End of Session - 11/18/16 1234    Visit Number 1   Number of Visits 13   Date for PT Re-Evaluation 12/18/16   Authorization Type medicare   Authorization - Visit Number 1   Authorization - Number of Visits 13   PT Start Time 1000   PT Stop Time 1030   PT Time Calculation (min) 30 min   Activity Tolerance Patient tolerated treatment well   Behavior During Therapy [facility] for tasks assessed/performed      Past Medical History:  Diagnosis Date  . Arthritis    hands and back  . Hypertension     Past Surgical History:  Procedure Laterality Date  . COLONOSCOPY N/A 09/08/2012   Procedure: COLONOSCOPY;  Surgeon: Carol Houston, MD;  [facility];  Service: Endoscopy;  Laterality: N/A;  730-moved to [address] to notify pt  . JOINT REPLACEMENT  2008   left hip  . TOTAL HIP ARTHROPLASTY Left Left Hip Replacement    There were no vitals filed for this visit.       Subjective Assessment - 11/18/16 0959    Subjective Pt had a fall on 11/30/2014 and fx her Lt tibial platau and Lt humerus after this she had significant Rt hip pain.  She is now scheduled for a THR on July 26th.  It will be a posterior approach.  Pt  is able to verbalize her precautions and she has a Secondary school teacher. and a commode    Pertinent History Fx LT tibial platau ; Lt humeral fx.   How long can you stand comfortably? immediate    How long can you walk comfortably? has increased pain upon the first step    Currently in Pain? Yes   Pain Score 10-Worst pain ever   Pain Location Hip   Pain Orientation Right   Pain Descriptors / Indicators Aching   Pain Type Chronic pain   Pain Onset More than a  month ago   Pain Frequency Constant   Aggravating Factors  activity    Pain Relieving Factors sitting    Effect of Pain on Daily Activities increases             [facility] PT Assessment - 11/18/16 0001      Assessment   Medical Diagnosis pre op for Rt THR   Referring Provider Carol Gutierrez   Onset Date/Surgical Date 11/27/16  surgery date    Prior Therapy yes but not for this episode      Precautions   Precautions Posterior Hip  will have      Restrictions   Weight Bearing Restrictions Yes     Balance Screen   Has the patient fallen in the past 6 months No   Has the patient had a decrease in activity level because of a fear of falling?  Yes   Is the patient reluctant to leave their home because of a fear of falling?  No     Home Social worker Private residence   Home Access Ramped entrance     Prior Function   Level of Independence Independent   Leisure pictures, playing instruments  Cognition   Overall Cognitive Status Within Functional Limits for tasks assessed     Functional Tests   Functional tests Single leg stance;Sit to Stand     Single Leg Stance   Comments B LE 10 seconds each      Sit to Stand   Comments unable without UE assist      ROM / Strength   AROM / PROM / Strength Strength     Strength   Overall Strength Within functional limits for tasks performed   Strength Assessment Site Hip;Knee;Ankle   Right/Left Hip Right;Left   Right Hip Extension 3/5   Right Hip ABduction 5/5   Left Hip Extension 3/5   Left Hip ABduction 4/5   Right/Left Knee Right;Left   Right Knee Flexion 3/5   Left Knee Flexion 3+/5            Objective measurements completed on examination: See above findings.    Bridges x 5  hip abduction x 5  prone hip extension x 5               PT Education - 11/18/16 1233    Education provided Yes   Education Details exercises to start today ; precautions for after surgery, ways to  keep house safe    Person(s) Educated Patient   Methods Explanation   Comprehension Verbalized understanding          PT Short Term Goals - 11/18/16 1612      PT SHORT TERM GOAL #1   Title Pt to be able to state her hip precautions to prevent dislocation of her right THR   Time 1   Period Weeks   Status New     PT SHORT TERM GOAL #2   Title Pt to state that her pain in her right hip  is no greater than a 4/10 to allow pt to walk with least assitive device for 15 minutes to complete short shopping trips.   Time 3   Period Weeks   Status New     PT SHORT TERM GOAL #3   Title Pt Rt LE strength to be improved 1/2 grade to allow pt to come from sit to stand with one hand assist    Time 3   Period Weeks   Status New     PT SHORT TERM GOAL #4   Title Pt to be able to single leg stance for 15 seconds on both LE to reduce risk of falls    Time 3   Period Weeks   Status New           PT Long Term Goals - 11/18/16 1615      PT LONG TERM GOAL #1   Title Pt to state that her pain in her right hip  is never greater than a 2/10 to allow pt to walk with no  assistve device for at least 23minutes to be able to complete shopping .Marland Kitchen   Time 6   Period Weeks   Status New     PT LONG TERM GOAL #2   Title Pt to be able to stand for 30 mintues to be able to make a meal    Time 6   Period Weeks   Status New     PT LONG TERM GOAL #3   Title Pt to be able to single leg stance on both legs for 30 seconds to be confident in walking on uneven terrain.    Time  6   Period Weeks   Status New     PT LONG TERM GOAL #4   Title Pt mm strength in her right leg to be increased one grade to allow pt to go up and down steps in a reciprocal manner.    Time 4   Period Weeks   Status New                Plan - 11/18/16 1235    Clinical Impression Statement Carol Gutierrez arrives today prior to her Rt posterior total hip replacement with an order for one pre op and 12 post op visits.   Carol Gutierrez will be having her surgery on 11/27/2016.  At this time we reviewed her precautions, spoke about making the house safe and I gave her some HEP to begin to strengthen her hip and improve her balance.  Carol Gutierrez will be returning after her THR and will need continued gait training to achieve ambulation without an assistive device, strengthening and balance activity to allow her to return to her previous functional level without pain.    History and Personal Factors relevant to plan of care: Hx of falls    Clinical Presentation Stable   Clinical Decision Making Moderate   Rehab Potential Good   PT Frequency 2x / week   PT Duration 6 weeks   PT Treatment/Interventions ADLs/Self Care Home Management;Patient/family education;Therapeutic exercise;Therapeutic activities;Stair training;Gait training;Functional mobility training;Manual techniques   PT Next Visit Plan Reassess due to surgery    PT Home Exercise Plan hip abduction and extension as well as SLS    Consulted and Agree with Plan of Care Patient      Patient will benefit from skilled therapeutic intervention in order to improve the following deficits and impairments:  Decreased activity tolerance, Decreased balance, Difficulty walking, Decreased range of motion, Decreased strength, Pain  Visit Diagnosis: Muscle weakness (generalized) - Plan: PT plan of care cert/re-cert  Difficulty in walking, not elsewhere classified - Plan: PT plan of care cert/re-cert  Pain in right hip - Plan: PT plan of care cert/re-cert      G-Codes - 85/88/50 1619    Functional Assessment Tool Used (Outpatient Only) clincal judgement:  subjective , strength, pain,    Functional Limitation Mobility: Walking and moving around   Mobility: Walking and Moving Around Current Status (Y7741) At least 40 percent but less than 60 percent impaired, limited or restricted   Mobility: Walking and Moving Around Goal Status (O8786) At least 20 percent but less than  40 percent impaired, limited or restricted       Problem List There are no active problems to display for this patient.  Rayetta Humphrey, PT CLT [phone] 11/18/2016, 4:27 PM  [facility] [address] [place], [place], [address] Phone: [phone]   Fax:  [phone]  Name: Carol Gutierrez MRN: [id number] Date of Birth: 07-13-1935

## 2016-11-18 NOTE — Patient Instructions (Addendum)
1:  sidelying hip abduction  2:  Prone hip extension  3:  Chair pushups  4:  SLS

## 2016-11-27 DIAGNOSIS — I10 Essential (primary) hypertension: Secondary | ICD-10-CM | POA: Diagnosis not present

## 2016-11-27 DIAGNOSIS — E785 Hyperlipidemia, unspecified: Secondary | ICD-10-CM | POA: Diagnosis not present

## 2016-11-27 DIAGNOSIS — M1611 Unilateral primary osteoarthritis, right hip: Secondary | ICD-10-CM | POA: Diagnosis not present

## 2016-11-27 DIAGNOSIS — M25551 Pain in right hip: Secondary | ICD-10-CM | POA: Diagnosis not present

## 2016-11-27 DIAGNOSIS — Z96643 Presence of artificial hip joint, bilateral: Secondary | ICD-10-CM | POA: Diagnosis not present

## 2016-11-27 DIAGNOSIS — Z8673 Personal history of transient ischemic attack (TIA), and cerebral infarction without residual deficits: Secondary | ICD-10-CM | POA: Diagnosis not present

## 2016-11-27 DIAGNOSIS — G8918 Other acute postprocedural pain: Secondary | ICD-10-CM | POA: Diagnosis not present

## 2016-11-27 DIAGNOSIS — Z471 Aftercare following joint replacement surgery: Secondary | ICD-10-CM | POA: Diagnosis not present

## 2016-12-01 ENCOUNTER — Ambulatory Visit (HOSPITAL_COMMUNITY): Payer: Medicare Other | Admitting: Physical Therapy

## 2016-12-01 DIAGNOSIS — M25551 Pain in right hip: Secondary | ICD-10-CM | POA: Diagnosis not present

## 2016-12-01 DIAGNOSIS — M6281 Muscle weakness (generalized): Secondary | ICD-10-CM | POA: Diagnosis not present

## 2016-12-01 DIAGNOSIS — R262 Difficulty in walking, not elsewhere classified: Secondary | ICD-10-CM | POA: Diagnosis not present

## 2016-12-01 DIAGNOSIS — R269 Unspecified abnormalities of gait and mobility: Secondary | ICD-10-CM | POA: Diagnosis not present

## 2016-12-01 NOTE — Therapy (Signed)
[facility] [address] [place], [place], [address] Phone: [phone]   Fax:  [phone]  Physical Therapy Treatment (Re-assess post surgery)  Patient Details  Name: Carol Gutierrez MRN: [id number] Date of Birth: April 11, 1936 Referring Provider: Kathe Mariner  Encounter Date: 12/01/2016      PT End of Session - 12/01/16 1645    Visit Number 2   Number of Visits 13   Date for PT Re-Evaluation 12/18/16   Authorization Type medicare   Authorization - Visit Number 2   Authorization - Number of Visits 13   PT Start Time 2542   PT Stop Time 1643   PT Time Calculation (min) 38 min   Activity Tolerance Patient tolerated treatment well   Behavior During Therapy [facility] for tasks assessed/performed      Past Medical History:  Diagnosis Date  . Arthritis    hands and back  . Hypertension     Past Surgical History:  Procedure Laterality Date  . COLONOSCOPY N/A 09/08/2012   Procedure: COLONOSCOPY;  Surgeon: Rogene Houston, MD;  [facility];  Service: Endoscopy;  Laterality: N/A;  730-moved to [address] to notify pt  . JOINT REPLACEMENT  2008   left hip  . TOTAL HIP ARTHROPLASTY Left Left Hip Replacement    There were no vitals filed for this visit.      Subjective Assessment - 12/01/16 1608    Subjective Patient returns stating that she is feeling better after surgery on 11/27/16; the surgeon used an anterior approach. She reports that she continues to have some pain post-surgically; she is now using a walker, she would like to get rid of this. She does not feel very concerned about her balance.    Pertinent History Fx LT tibial platau ; Lt humeral fx.   Currently in Pain? Yes   Pain Score 4    Pain Location Hip   Pain Orientation Right   Pain Descriptors / Indicators Aching   Pain Type Chronic pain   Pain Radiating Towards none    Pain Onset More than a month ago   Pain Frequency Constant   Aggravating Factors   sitting    Pain Relieving Factors standing    Effect of Pain on Daily Activities minor             [facility] PT Assessment - 12/01/16 0001      Strength   Right Hip Flexion 3/5   Left Hip Flexion 4/5   Right Knee Flexion 4+/5   Right Knee Extension 5/5   Left Knee Flexion 4+/5   Left Knee Extension 5/5   Right Ankle Dorsiflexion 5/5   Left Ankle Dorsiflexion 5/5     Ambulation/Gait   Gait Comments flexed at hips, reduced step lengths/stance times, proximal weakness     High Level Balance   High Level Balance Comments TUG 30 seconds with walker                      [facility] Adult PT Treatment/Exercise - 12/01/16 0001      Exercises   Exercises Knee/Hip     Knee/Hip Exercises: Standing   Heel Raises Both;1 set;15 reps   Heel Raises Limitations heel and toe    Lateral Step Up Both;1 set;10 reps   Lateral Step Up Limitations 4 inch box U HHA    Forward Step Up Both;1 set;10 reps   Forward Step Up Limitations 4 inch box  U HHA      Knee/Hip Exercises: Seated   Sit to Sand 15 reps;without UE support     Knee/Hip Exercises: Supine   Bridges Both;1 set;15 reps     Knee/Hip Exercises: Sidelying   Hip ABduction 1 set;10 reps;Right   Hip ABduction Limitations sidelying pillow between LEs                 PT Education - 12/01/16 1644    Education provided Yes   Education Details hip precautions, POC moving forward, HEP updates including precautions for anterior hiop    Person(s) Educated Patient   Methods Explanation;Handout   Comprehension Verbalized understanding          PT Short Term Goals - 12/01/16 1619      PT SHORT TERM GOAL #1   Title Pt to be able to state her hip precautions to prevent dislocation of her right THR   Baseline 7/30- able to restate anterior hip precautions successfully; reports no other restrictions per MD    Time 1   Period Weeks   Status Achieved     PT SHORT TERM GOAL #2   Title Pt to state that her pain in her  right hip  is no greater than a 4/10 to allow pt to walk with least assitive device for 15 minutes to complete short shopping trips.   Baseline 7/30- pain is 4/10, patient reoprts susccess at shopping trip    Time 3   Period Weeks   Status Achieved     PT SHORT TERM GOAL #3   Title Pt Rt LE strength to be improved 1/2 grade to allow pt to come from sit to stand with one hand assist    Baseline 7/30- LE strength has improved, hip strength remains weak per functional assessment    Time 3   Period Weeks   Status Partially Met     PT SHORT TERM GOAL #4   Title Pt to be able to single leg stance for 15 seconds on both LE to reduce risk of falls    Baseline 7/30- DNT    Time 3   Period Weeks   Status On-going           PT Long Term Goals - 12/01/16 1622      PT LONG TERM GOAL #1   Title Pt to state that her pain in her right hip  is never greater than a 2/10 to allow pt to walk with no  assistve device for at least 97mnutes to be able to complete shopping ..Marland Kitchen  Baseline 7/30- ongoing    Time 6   Period Weeks   Status On-going     PT LONG TERM GOAL #2   Title Pt to be able to stand for 30 mintues to be able to make a meal    Baseline 7/30- reports able to stand with 30 min now without difficulty    Time 6   Period Weeks   Status Achieved     PT LONG TERM GOAL #3   Title Pt to be able to single leg stance on both legs for 30 seconds to be confident in walking on uneven terrain.    Baseline 7/30- DNT    Time 6   Period Weeks   Status On-going     PT LONG TERM GOAL #4   Title Pt mm strength in her right leg to be increased one grade to allow pt to go up  and down steps in a reciprocal manner.    Baseline 7/30-LE strength has improved but stairs remain a problem    Time 4   Period Weeks   Status On-going               Plan - 24-Dec-2016 1645    Clinical Impression Statement Re-assessment performed today follow surgery for anterior THR R LE. Patient reports much  improved comfort following her surgery, and demonstrates improved functional strength and activity tolerance as compared to initial evaluation. Per patient, MD states no precautions besides standard anterior hip. She was educated regarding her anterior hip precautions and demonstrates ongoing functional proximal muscle weakness, balance impairment, and difficulty with tasks such as stair navigation. Recommend continuation of skilled PT services to address functional deficits and assist in reaching optimal level of function following surgery.    Rehab Potential Good   PT Frequency 2x / week   PT Duration 6 weeks   PT Treatment/Interventions ADLs/Self Care Home Management;Patient/family education;Therapeutic exercise;Therapeutic activities;Stair training;Gait training;Functional mobility training;Manual techniques   PT Next Visit Plan functional strenghtening and balance; review anterior hip precuations    PT Home Exercise Plan hip abduction and extension as well as SLS; 12/25/2022: adjusted- bridges, sts no UEs, hip abduction with pillow between LEs    Consulted and Agree with Plan of Care Patient      Patient will benefit from skilled therapeutic intervention in order to improve the following deficits and impairments:  Decreased activity tolerance, Decreased balance, Difficulty walking, Decreased range of motion, Decreased strength, Pain  Visit Diagnosis: Muscle weakness (generalized)  Difficulty in walking, not elsewhere classified  Pain in right hip  Abnormal gait       G-Codes - 24-Dec-2016 1646    Functional Assessment Tool Used (Outpatient Only) clincal judgement:  subjective , strength, pain,    Functional Limitation Mobility: Walking and moving around   Mobility: Walking and Moving Around Current Status (W2574) At least 20 percent but less than 40 percent impaired, limited or restricted   Mobility: Walking and Moving Around Goal Status (V3552) At least 1 percent but less than 20 percent  impaired, limited or restricted      Problem List There are no active problems to display for this patient.   Deniece Ree PT, DPT [phone]  [facility] [address] [place], [place], [address] Phone: [phone]   Fax:  [phone]  Name: Carol Gutierrez MRN: [id number] Date of Birth: Dec 24, 1935

## 2016-12-01 NOTE — Patient Instructions (Signed)
   BRIDGING  While lying on your back, tighten your lower abdominals, squeeze your buttocks and then raise your buttocks off the floor/bed as creating a "Bridge" with your body.  Repeat 10-15 times, twice a day.    SLR abduction (pillow between your legs please)  Lie down on your side. Bend your bottom leg while keeping the top leg straight. Your top leg should be in line with your torso. Raise that leg up off the table and back down. Make sure that you don't lead the lift with your toe, but keep the outside of your foot flat and parallel with the ceiling. You should feel the muscle in your outer hip working.  Repeat 10 times each side, twice a day.    SIT TO STAND - NO SUPPORT (walker in front for safety)  Start by scooting close to the front of the chair.  Next, lean forward at your trunk and reach forward with your arms and rise to standing without using your hands to push off from the chair or other object.   Use your arms as a counter-balance by reaching forward when in sitting and lower them as you approach standing.   Repeat 10-15 times, twice a day.

## 2016-12-04 ENCOUNTER — Ambulatory Visit (HOSPITAL_COMMUNITY): Payer: Medicare Other | Attending: Student | Admitting: Physical Therapy

## 2016-12-04 DIAGNOSIS — M25551 Pain in right hip: Secondary | ICD-10-CM

## 2016-12-04 DIAGNOSIS — R262 Difficulty in walking, not elsewhere classified: Secondary | ICD-10-CM | POA: Diagnosis not present

## 2016-12-04 DIAGNOSIS — M6281 Muscle weakness (generalized): Secondary | ICD-10-CM

## 2016-12-04 DIAGNOSIS — R269 Unspecified abnormalities of gait and mobility: Secondary | ICD-10-CM

## 2016-12-04 NOTE — Therapy (Signed)
[facility] [address] [place], [place], [address] Phone: [phone]   Fax:  [phone]  Physical Therapy Treatment  Patient Details  Name: Carol Gutierrez MRN: [id number] Date of Birth: 06/20/1935 Referring Provider: Maxwell Lanfitt  Encounter Date: 12/04/2016      PT End of Session - 12/04/16 1205    Visit Number 3   Number of Visits 13   Date for PT Re-Evaluation 12/18/16   Authorization Type medicare   Authorization - Visit Number 3   Authorization - Number of Visits 13   PT Start Time 1036   PT Stop Time 1116   PT Time Calculation (min) 40 min   Activity Tolerance Patient tolerated treatment well   Behavior During Therapy WFL for tasks assessed/performed      Past Medical History:  Diagnosis Date  . Arthritis    hands and back  . Hypertension     Past Surgical History:  Procedure Laterality Date  . COLONOSCOPY N/A 09/08/2012   Procedure: COLONOSCOPY;  Surgeon: Najeeb U Rehman, MD;  [facility];  Service: Endoscopy;  Laterality: N/A;  730-moved to 830 Ann to notify pt  . JOINT REPLACEMENT  2008   left hip  . TOTAL HIP ARTHROPLASTY Left Left Hip Replacement    There were no vitals filed for this visit.      Subjective Assessment - 12/04/16 1044    Subjective Pt admits to not doing HEP except for seated LAQ.  Reports she is having 4/10 in her Rt hip.     Currently in Pain? Yes   Pain Score 4    Pain Location Hip   Pain Orientation Right   Pain Descriptors / Indicators Aching   Pain Type Chronic pain                         [facility] Adult PT Treatment/Exercise - 12/04/16 0001      Knee/Hip Exercises: Standing   Heel Raises Both;15 reps;1 set   Heel Raises Limitations heel and toe    Forward Lunges 10 reps   Forward Lunges Limitations onto 4" box no UE's   Lateral Step Up Both;10 reps;2 sets   Lateral Step Up Limitations 4 inch box U HHA    Forward Step Up Both;10 reps;2 sets   Forward Step Up Limitations 4 inch box U HHA    Step Down Both;10 reps;Hand Hold: 1;Step Height: 4"   Step Down Limitations 4 inch box U UE   SLS Lt: 12" Rt: 8" max of 3 no UE's   SLS with Vectors 5X3" each with 1 HHA     Knee/Hip Exercises: Seated   Sit to Sand 15 reps;without UE support                PT Education - 12/04/16 1204    Education provided Yes   Education Details hip precautions, HEP review and importance of compliance for optimal benefits   Person(s) Educated Patient   Methods Explanation   Comprehension Verbalized understanding          PT Short Term Goals - 12/01/16 1619      PT SHORT TERM GOAL #1   Title Pt to be able to state her hip precautions to prevent dislocation of her right THR   Baseline 7/30- able to restate anterior hip precautions successfully; reports no other restrictions per MD    Time 1   Period Weeks     Status Achieved     PT SHORT TERM GOAL #2   Title Pt to state that her pain in her right hip  is no greater than a 4/10 to allow pt to walk with least assitive device for 15 minutes to complete short shopping trips.   Baseline 7/30- pain is 4/10, patient reoprts susccess at shopping trip    Time 3   Period Weeks   Status Achieved     PT SHORT TERM GOAL #3   Title Pt Rt LE strength to be improved 1/2 grade to allow pt to come from sit to stand with one hand assist    Baseline 7/30- LE strength has improved, hip strength remains weak per functional assessment    Time 3   Period Weeks   Status Partially Met     PT SHORT TERM GOAL #4   Title Pt to be able to single leg stance for 15 seconds on both LE to reduce risk of falls    Baseline 7/30- DNT    Time 3   Period Weeks   Status On-going           PT Long Term Goals - 12/01/16 1622      PT LONG TERM GOAL #1   Title Pt to state that her pain in her right hip  is never greater than a 2/10 to allow pt to walk with no  assistve device for at least 22mnutes to be able to  complete shopping ..Marland Kitchen  Baseline 7/30- ongoing    Time 6   Period Weeks   Status On-going     PT LONG TERM GOAL #2   Title Pt to be able to stand for 30 mintues to be able to make a meal    Baseline 7/30- reports able to stand with 30 min now without difficulty    Time 6   Period Weeks   Status Achieved     PT LONG TERM GOAL #3   Title Pt to be able to single leg stance on both legs for 30 seconds to be confident in walking on uneven terrain.    Baseline 7/30- DNT    Time 6   Period Weeks   Status On-going     PT LONG TERM GOAL #4   Title Pt mm strength in her right leg to be increased one grade to allow pt to go up and down steps in a reciprocal manner.    Baseline 7/30-LE strength has improved but stairs remain a problem    Time 4   Period Weeks   Status On-going               Plan - 12/04/16 1205    Clinical Impression Statement Reviewed HEP and progressed functional strengthening exericses.  PT required encoruagement to attempt exercises with decreased UE asssist and would not attempt gait with SPC this session.  Pt was able to complete all activities with 1 UE only and with cues for form.  Pt will need additional encouragement to get out of "comfort zone".  No pain or increase in pain reported at end of session.    Rehab Potential Good   PT Frequency 2x / week   PT Duration 6 weeks   PT Treatment/Interventions ADLs/Self Care Home Management;Patient/family education;Therapeutic exercise;Therapeutic activities;Stair training;Gait training;Functional mobility training;Manual techniques   PT Next Visit Plan continue to progress functional strenghtening and balance   PT Home Exercise Plan hip abduction and extension as well as  SLS; 7/30: adjusted- bridges, sts no UEs, hip abduction with pillow between LEs    Consulted and Agree with Plan of Care Patient      Patient will benefit from skilled therapeutic intervention in order to improve the following deficits and  impairments:  Decreased activity tolerance, Decreased balance, Difficulty walking, Decreased range of motion, Decreased strength, Pain  Visit Diagnosis: Muscle weakness (generalized)  Difficulty in walking, not elsewhere classified  Pain in right hip  Abnormal gait     Problem List There are no active problems to display for this patient.  Teena Irani, PTA/CLT [phone]  Teena Irani 12/04/2016, 12:14 PM  [facility] [address] [place], [place], [address] Phone: [phone]   Fax:  [phone]  Name: Carol Gutierrez MRN: [id number] Date of Birth: 1935-09-07

## 2016-12-08 ENCOUNTER — Ambulatory Visit (HOSPITAL_COMMUNITY): Payer: Medicare Other | Admitting: Physical Therapy

## 2016-12-08 DIAGNOSIS — R262 Difficulty in walking, not elsewhere classified: Secondary | ICD-10-CM | POA: Diagnosis not present

## 2016-12-08 DIAGNOSIS — M6281 Muscle weakness (generalized): Secondary | ICD-10-CM | POA: Diagnosis not present

## 2016-12-08 DIAGNOSIS — R269 Unspecified abnormalities of gait and mobility: Secondary | ICD-10-CM

## 2016-12-08 DIAGNOSIS — M25551 Pain in right hip: Secondary | ICD-10-CM | POA: Diagnosis not present

## 2016-12-08 NOTE — Therapy (Signed)
[facility] [address] [place], [place], [address] Phone: [phone]   Fax:  [phone]  Physical Therapy Treatment  Patient Details  Name: Carol Gutierrez MRN: [id number] Date of Birth: Mar 18, 1936 Referring Provider: Kathe Mariner  Encounter Date: 12/08/2016      PT End of Session - 12/08/16 1045    Visit Number 4   Number of Visits 13   Date for PT Re-Evaluation 12/18/16   Authorization Type medicare   Authorization - Visit Number 4   Authorization - Number of Visits 13   PT Start Time 1033   PT Stop Time 1120   PT Time Calculation (min) 47 min   Activity Tolerance Patient tolerated treatment well   Behavior During Therapy [facility] for tasks assessed/performed      Past Medical History:  Diagnosis Date  . Arthritis    hands and back  . Hypertension     Past Surgical History:  Procedure Laterality Date  . COLONOSCOPY N/A 09/08/2012   Procedure: COLONOSCOPY;  Surgeon: Rogene Houston, MD;  [facility];  Service: Endoscopy;  Laterality: N/A;  730-moved to 830 [address] to notify pt  . JOINT REPLACEMENT  2008   left hip  . TOTAL HIP ARTHROPLASTY Left Left Hip Replacement    There were no vitals filed for this visit.      Subjective Assessment - 12/08/16 1038    Subjective Pt gives Rt femoral/hip pain 3/10 currently.  states shes she's having the most difficulty with getting comfortable to sleep at night due to this and continued Lt knee and shoulder pain from her fall.  States she is still with soreness from the exercises.     Currently in Pain? Yes   Pain Score 3    Pain Location Hip   Pain Orientation Right   Pain Descriptors / Indicators Aching   Pain Type Surgical pain                         [facility] Adult PT Treatment/Exercise - 12/08/16 0001      Knee/Hip Exercises: Standing   Heel Raises Both;15 reps;1 set   Heel Raises Limitations heel and toe    Forward Lunges 10 reps   Forward  Lunges Limitations onto 4" box no UE's   Lateral Step Up Both;10 reps;2 sets   Lateral Step Up Limitations 4 inch box U HHA    Forward Step Up Both;10 reps;2 sets   Forward Step Up Limitations 4 inch box U HHA    Step Down Both;10 reps;Hand Hold: 1;Step Height: 4"   Step Down Limitations 4 inch box U UE   Stairs 6" with HR on Lt going up 2RT reciprocally   Gait Training with SPC X 226 feet   Other Standing Knee Exercises retro, tandem and sidestpping on line 2RT each     Knee/Hip Exercises: Seated   Sit to Sand 15 reps;without UE support                  PT Short Term Goals - 12/01/16 1619      PT SHORT TERM GOAL #1   Title Pt to be able to state her hip precautions to prevent dislocation of her right THR   Baseline 7/30- able to restate anterior hip precautions successfully; reports no other restrictions per MD    Time 1   Period Weeks   Status Achieved     PT  SHORT TERM GOAL #2   Title Pt to state that her pain in her right hip  is no greater than a 4/10 to allow pt to walk with least assitive device for 15 minutes to complete short shopping trips.   Baseline 7/30- pain is 4/10, patient reoprts susccess at shopping trip    Time 3   Period Weeks   Status Achieved     PT SHORT TERM GOAL #3   Title Pt Rt LE strength to be improved 1/2 grade to allow pt to come from sit to stand with one hand assist    Baseline 7/30- LE strength has improved, hip strength remains weak per functional assessment    Time 3   Period Weeks   Status Partially Met     PT SHORT TERM GOAL #4   Title Pt to be able to single leg stance for 15 seconds on both LE to reduce risk of falls    Baseline 7/30- DNT    Time 3   Period Weeks   Status On-going           PT Long Term Goals - 12/01/16 1622      PT LONG TERM GOAL #1   Title Pt to state that her pain in her right hip  is never greater than a 2/10 to allow pt to walk with no  assistve device for at least 69mnutes to be able to  complete shopping ..Marland Kitchen  Baseline 7/30- ongoing    Time 6   Period Weeks   Status On-going     PT LONG TERM GOAL #2   Title Pt to be able to stand for 30 mintues to be able to make a meal    Baseline 7/30- reports able to stand with 30 min now without difficulty    Time 6   Period Weeks   Status Achieved     PT LONG TERM GOAL #3   Title Pt to be able to single leg stance on both legs for 30 seconds to be confident in walking on uneven terrain.    Baseline 7/30- DNT    Time 6   Period Weeks   Status On-going     PT LONG TERM GOAL #4   Title Pt mm strength in her right leg to be increased one grade to allow pt to go up and down steps in a reciprocal manner.    Baseline 7/30-LE strength has improved but stairs remain a problem    Time 4   Period Weeks   Status On-going               Plan - 12/08/16 1206    Clinical Impression Statement continued with therex progression.  Able to encourage to complete balance tasks without AD and begin ambulation using SPC.  PT with overall good sequencing and no LOB.  Able to complete stairs reciprocally, however noted challenge due to weakness.  Pt reported no pain at EOS.   Rehab Potential Good   PT Frequency 2x / week   PT Duration 6 weeks   PT Treatment/Interventions ADLs/Self Care Home Management;Patient/family education;Therapeutic exercise;Therapeutic activities;Stair training;Gait training;Functional mobility training;Manual techniques   PT Next Visit Plan continue to progress functional strenghtening and balance   PT Home Exercise Plan hip abduction and extension as well as SLS; 7/30: adjusted- bridges, sts no UEs, hip abduction with pillow between LEs    Consulted and Agree with Plan of Care Patient  Patient will benefit from skilled therapeutic intervention in order to improve the following deficits and impairments:  Decreased activity tolerance, Decreased balance, Difficulty walking, Decreased range of motion, Decreased  strength, Pain  Visit Diagnosis: Muscle weakness (generalized)  Difficulty in walking, not elsewhere classified  Pain in right hip  Abnormal gait     Problem List There are no active problems to display for this patient.  Teena Irani, PTA/CLT [phone]  Teena Irani 12/08/2016, 12:08 PM  [facility] [place], [place], [address] Phone: [phone]   Fax:  [phone]  Name: Carol Gutierrez MRN: [id number] Date of Birth: 1936/03/19

## 2016-12-11 ENCOUNTER — Ambulatory Visit (HOSPITAL_COMMUNITY): Payer: Medicare Other | Admitting: Physical Therapy

## 2016-12-11 DIAGNOSIS — R262 Difficulty in walking, not elsewhere classified: Secondary | ICD-10-CM | POA: Diagnosis not present

## 2016-12-11 DIAGNOSIS — M25551 Pain in right hip: Secondary | ICD-10-CM

## 2016-12-11 DIAGNOSIS — M6281 Muscle weakness (generalized): Secondary | ICD-10-CM

## 2016-12-11 DIAGNOSIS — R269 Unspecified abnormalities of gait and mobility: Secondary | ICD-10-CM | POA: Diagnosis not present

## 2016-12-11 NOTE — Therapy (Signed)
[facility] [place], [place], [address] Phone: [phone]   Fax:  [phone]  Physical Therapy Treatment  Patient Details  Name: Carol Gutierrez MRN: [id number] Date of Birth: 1936-03-02 Referring Provider: Kathe Mariner  Encounter Date: 12/11/2016      PT End of Session - 12/11/16 1130    Visit Number 5   Number of Visits 13   Date for PT Re-Evaluation 12/18/16   Authorization Type medicare   Authorization - Visit Number 5   Authorization - Number of Visits 13   PT Start Time 6967   PT Stop Time 1118   PT Time Calculation (min) 40 min   Activity Tolerance Patient tolerated treatment well   Behavior During Therapy [facility] for tasks assessed/performed      Past Medical History:  Diagnosis Date  . Arthritis    hands and back  . Hypertension     Past Surgical History:  Procedure Laterality Date  . COLONOSCOPY N/A 09/08/2012   Procedure: COLONOSCOPY;  Surgeon: Rogene Houston, MD;  [facility];  Service: Endoscopy;  Laterality: N/A;  730-moved to 830 [address] to notify pt  . JOINT REPLACEMENT  2008   left hip  . TOTAL HIP ARTHROPLASTY Left Left Hip Replacement    There were no vitals filed for this visit.      Subjective Assessment - 12/11/16 1136    Subjective Pt states she went to the MD yesterday and had to do alot of walking.  Noted when she got into office that she had swelling and tightness in her anterior thigh.  States MD told her to ice it 20 minutes on/off.  Comes today using short base QC with good stabiltiy.     Currently in Pain? Yes   Pain Score 4    Pain Location Hip   Pain Orientation Right   Pain Type Surgical pain                         [facility] Adult PT Treatment/Exercise - 12/11/16 0001      Knee/Hip Exercises: Standing   Heel Raises Both;15 reps;1 set   Heel Raises Limitations heel and toe    Forward Lunges 15 reps   Forward Lunges Limitations no UE's onto  floor   Lateral Step Up Both;10 reps;1 set;Hand Hold: 0;Step Height: 6"   Lateral Step Up Limitations 6 inch box no HHA    Forward Step Up Both;10 reps;Hand Hold: 1;Step Height: 6"   Forward Step Up Limitations 6 inch box U HHA    SLS Lt: 5" Rt:6" max of 3 no UE's     Knee/Hip Exercises: Seated   Sit to Sand 10 reps     Manual Therapy   Manual Therapy Edema management;Soft tissue mobilization   Manual therapy comments completed seperately from all other skilled interventions, completed at beginning of session   Edema Management to encourage drainage   Soft tissue mobilization to decrease scar tissue anterior hip region and thigh                PT Education - 12/11/16 1135    Education provided Yes   Education Details continue with self manual and scar massage to area and use of ice as directed by physician.   Person(s) Educated Patient   Methods Explanation   Comprehension Verbalized understanding          PT Short Term Goals -  12/01/16 1619      PT SHORT TERM GOAL #1   Title Pt to be able to state her hip precautions to prevent dislocation of her right THR   Baseline 7/30- able to restate anterior hip precautions successfully; reports no other restrictions per MD    Time 1   Period Weeks   Status Achieved     PT SHORT TERM GOAL #2   Title Pt to state that her pain in her right hip  is no greater than a 4/10 to allow pt to walk with least assitive device for 15 minutes to complete short shopping trips.   Baseline 7/30- pain is 4/10, patient reoprts susccess at shopping trip    Time 3   Period Weeks   Status Achieved     PT SHORT TERM GOAL #3   Title Pt Rt LE strength to be improved 1/2 grade to allow pt to come from sit to stand with one hand assist    Baseline 7/30- LE strength has improved, hip strength remains weak per functional assessment    Time 3   Period Weeks   Status Partially Met     PT SHORT TERM GOAL #4   Title Pt to be able to single leg  stance for 15 seconds on both LE to reduce risk of falls    Baseline 7/30- DNT    Time 3   Period Weeks   Status On-going           PT Long Term Goals - 12/01/16 1622      PT LONG TERM GOAL #1   Title Pt to state that her pain in her right hip  is never greater than a 2/10 to allow pt to walk with no  assistve device for at least 69mnutes to be able to complete shopping ..Marland Kitchen  Baseline 7/30- ongoing    Time 6   Period Weeks   Status On-going     PT LONG TERM GOAL #2   Title Pt to be able to stand for 30 mintues to be able to make a meal    Baseline 7/30- reports able to stand with 30 min now without difficulty    Time 6   Period Weeks   Status Achieved     PT LONG TERM GOAL #3   Title Pt to be able to single leg stance on both legs for 30 seconds to be confident in walking on uneven terrain.    Baseline 7/30- DNT    Time 6   Period Weeks   Status On-going     PT LONG TERM GOAL #4   Title Pt mm strength in her right leg to be increased one grade to allow pt to go up and down steps in a reciprocal manner.    Baseline 7/30-LE strength has improved but stairs remain a problem    Time 4   Period Weeks   Status On-going               Plan - 12/11/16 1130    Clinical Impression Statement Pt with increased swelling and induration Rt anterior thigh.  Noted tightness of scar and fullness.  Began session with manual techniques to this area to help encourage reduction in edema and scar tissue.  Noted improvment after with reduced induration and overall improved comfort and irritation.  Continued with LE strengthening/balance.   Able to increase to 6" step with step ups and decrease to floor level with lunges.  Pt with difficulty balanceing greater than 5" on each LE this session without UE's. PT reported overall improvement at end of session.    Rehab Potential Good   PT Frequency 2x / week   PT Duration 6 weeks   PT Treatment/Interventions ADLs/Self Care Home  Management;Patient/family education;Therapeutic exercise;Therapeutic activities;Stair training;Gait training;Functional mobility training;Manual techniques   PT Next Visit Plan continue to progress functional strenghtening and balance.  Attempt baalnce beam next session.   PT Home Exercise Plan hip abduction and extension as well as SLS; 7/30: adjusted- bridges, sts no UEs, hip abduction with pillow between LEs    Consulted and Agree with Plan of Care Patient      Patient will benefit from skilled therapeutic intervention in order to improve the following deficits and impairments:  Decreased activity tolerance, Decreased balance, Difficulty walking, Decreased range of motion, Decreased strength, Pain  Visit Diagnosis: Muscle weakness (generalized)  Difficulty in walking, not elsewhere classified  Pain in right hip  Abnormal gait     Problem List There are no active problems to display for this patient.  Teena Irani, PTA/CLT [phone]  Teena Irani 12/11/2016, 11:41 AM  [facility] [place], [place], [address] Phone: [phone]   Fax:  [phone]  Name: Carol Gutierrez MRN: [id number] Date of Birth: [birth year]

## 2016-12-15 ENCOUNTER — Ambulatory Visit (HOSPITAL_COMMUNITY): Payer: Medicare Other | Admitting: Physical Therapy

## 2016-12-15 DIAGNOSIS — R269 Unspecified abnormalities of gait and mobility: Secondary | ICD-10-CM | POA: Diagnosis not present

## 2016-12-15 DIAGNOSIS — M25551 Pain in right hip: Secondary | ICD-10-CM | POA: Diagnosis not present

## 2016-12-15 DIAGNOSIS — M6281 Muscle weakness (generalized): Secondary | ICD-10-CM

## 2016-12-15 DIAGNOSIS — R262 Difficulty in walking, not elsewhere classified: Secondary | ICD-10-CM

## 2016-12-15 NOTE — Therapy (Signed)
[place] [address] [place], [place], [address] Phone: [phone]   Fax:  [phone]  Physical Therapy Treatment  Patient Details  Name: Carol Gutierrez MRN: [id number] Date of Birth: [birth year] Referring Provider: Kathe Mariner  Encounter Date: 12/15/2016      PT End of Session - 12/15/16 1204    Visit Number 6   Number of Visits 13   Date for PT Re-Evaluation 12/18/16   Authorization Type medicare   Authorization - Visit Number 6   Authorization - Number of Visits 13   PT Start Time 6962   PT Stop Time 1208   PT Time Calculation (min) 43 min   Activity Tolerance Patient tolerated treatment well   Behavior During Therapy [facility] for tasks assessed/performed      Past Medical History:  Diagnosis Date  . Arthritis    hands and back  . Hypertension     Past Surgical History:  Procedure Laterality Date  . COLONOSCOPY N/A 09/08/2012   Procedure: COLONOSCOPY;  Surgeon: Rogene Houston, MD;  [facility];  Service: Endoscopy;  Laterality: N/A;  730-moved to 830 Ann to notify pt  . JOINT REPLACEMENT  2008   left hip  . TOTAL HIP ARTHROPLASTY Left Left Hip Replacement    There were no vitals filed for this visit.      Subjective Assessment - 12/15/16 1128    Subjective Pt states that her pain is about a 3/10    Pertinent History Fx LT tibial platau ; Lt humeral fx.   Currently in Pain? Yes   Pain Score 3    Pain Orientation Right   Pain Descriptors / Indicators Aching   Pain Type Acute pain   Pain Radiating Towards knee   Pain Onset More than a month ago   Aggravating Factors  standing up increases her pain    Pain Relieving Factors ice   Effect of Pain on Daily Activities no problem                         [facility] Adult PT Treatment/Exercise - 12/15/16 0001      Knee/Hip Exercises: Standing   Heel Raises Both;15 reps;1 set   Heel Raises Limitations heel and toe    Forward Lunges 15  reps   Forward Lunges Limitations no UE's onto stair   Lateral Step Up Both;10 reps;1 set;Hand Hold: 0;Step Height: 6"   Lateral Step Up Limitations 6 inch box no HHA    Forward Step Up Both;10 reps;Hand Hold: 1;Step Height: 6"   Forward Step Up Limitations 6 inch box U HHA    Stairs x3 reps   Rocker Board Limitations B max 10"   SLS --   Other Standing Knee Exercises tandem gt on balance beam.    Other Standing Knee Exercises marching in place x 10, retro and tandem gt      Knee/Hip Exercises: Seated   Sit to Sand 10 reps     Manual Therapy   Manual Therapy Edema management;Soft tissue mobilization   Manual therapy comments completed seperately from all other skilled interventions, completed at beginning of session   Edema Management to encourage drainage   Soft tissue mobilization to decrease scar tissue anterior hip region and thigh                  PT Short Term Goals - 12/01/16 1619      PT  SHORT TERM GOAL #1   Title Pt to be able to state her hip precautions to prevent dislocation of her right THR   Baseline 7/30- able to restate anterior hip precautions successfully; reports no other restrictions per MD    Time 1   Period Weeks   Status Achieved     PT SHORT TERM GOAL #2   Title Pt to state that her pain in her right hip  is no greater than a 4/10 to allow pt to walk with least assitive device for 15 minutes to complete short shopping trips.   Baseline 7/30- pain is 4/10, patient reoprts susccess at shopping trip    Time 3   Period Weeks   Status Achieved     PT SHORT TERM GOAL #3   Title Pt Rt LE strength to be improved 1/2 grade to allow pt to come from sit to stand with one hand assist    Baseline 7/30- LE strength has improved, hip strength remains weak per functional assessment    Time 3   Period Weeks   Status Partially Met     PT SHORT TERM GOAL #4   Title Pt to be able to single leg stance for 15 seconds on both LE to reduce risk of falls     Baseline 7/30- DNT    Time 3   Period Weeks   Status On-going           PT Long Term Goals - 12/01/16 1622      PT LONG TERM GOAL #1   Title Pt to state that her pain in her right hip  is never greater than a 2/10 to allow pt to walk with no  assistve device for at least 43mnutes to be able to complete shopping ..Marland Kitchen  Baseline 7/30- ongoing    Time 6   Period Weeks   Status On-going     PT LONG TERM GOAL #2   Title Pt to be able to stand for 30 mintues to be able to make a meal    Baseline 7/30- reports able to stand with 30 min now without difficulty    Time 6   Period Weeks   Status Achieved     PT LONG TERM GOAL #3   Title Pt to be able to single leg stance on both legs for 30 seconds to be confident in walking on uneven terrain.    Baseline 7/30- DNT    Time 6   Period Weeks   Status On-going     PT LONG TERM GOAL #4   Title Pt mm strength in her right leg to be increased one grade to allow pt to go up and down steps in a reciprocal manner.    Baseline 7/30-LE strength has improved but stairs remain a problem    Time 4   Period Weeks   Status On-going               Plan - 12/15/16 1316    Clinical Impression Statement Pt treatment focused on balance and strength with noted improvement in functional activity such as sit to stand and ascending/descending steps.   Rehab Potential Good   PT Frequency 2x / week   PT Duration 6 weeks   PT Treatment/Interventions ADLs/Self Care Home Management;Patient/family education;Therapeutic exercise;Therapeutic activities;Stair training;Gait training;Functional mobility training;Manual techniques   PT Next Visit Plan use hurdles with balance beam next treatment.    PT Home Exercise Plan hip abduction and  extension as well as SLS; 7/30: adjusted- bridges, sts no UEs, hip abduction with pillow between LEs    Consulted and Agree with Plan of Care Patient      Patient will benefit from skilled therapeutic intervention in  order to improve the following deficits and impairments:  Decreased activity tolerance, Decreased balance, Difficulty walking, Decreased range of motion, Decreased strength, Pain  Visit Diagnosis: Muscle weakness (generalized)  Difficulty in walking, not elsewhere classified  Pain in right hip  Abnormal gait     Problem List There are no active problems to display for this patient.  Rayetta Humphrey, PT CLT [phone] 12/15/2016, 1:19 PM  [place], [place], [address] Phone: [phone]   Fax:  [phone]  Name: LILIBETH OPIE MRN: [id number] Date of Birth: 06/25/1935

## 2016-12-18 ENCOUNTER — Ambulatory Visit (HOSPITAL_COMMUNITY): Payer: Medicare Other | Admitting: Physical Therapy

## 2016-12-18 DIAGNOSIS — M6281 Muscle weakness (generalized): Secondary | ICD-10-CM

## 2016-12-18 DIAGNOSIS — R262 Difficulty in walking, not elsewhere classified: Secondary | ICD-10-CM

## 2016-12-18 DIAGNOSIS — M25551 Pain in right hip: Secondary | ICD-10-CM | POA: Diagnosis not present

## 2016-12-18 DIAGNOSIS — R269 Unspecified abnormalities of gait and mobility: Secondary | ICD-10-CM | POA: Diagnosis not present

## 2016-12-18 NOTE — Therapy (Signed)
[place] [address] [place], [place], [address] Phone: [phone]   Fax:  [phone]  Physical Therapy Treatment  Patient Details  Name: Carol Gutierrez MRN: [id number] Date of Birth: Oct 23, 1935 Referring Provider: Kathe Mariner  Encounter Date: 12/18/2016      PT End of Session - 12/18/16 1140    Visit Number 7   Number of Visits 13   Date for PT Re-Evaluation 01/01/17   Authorization Type medicare   Authorization - Visit Number 7   Authorization - Number of Visits 13   PT Start Time 2993   PT Stop Time 1118   PT Time Calculation (min) 43 min   Activity Tolerance Patient tolerated treatment well   Behavior During Therapy [facility] for tasks assessed/performed      Past Medical History:  Diagnosis Date  . Arthritis    hands and back  . Hypertension     Past Surgical History:  Procedure Laterality Date  . COLONOSCOPY N/A 09/08/2012   Procedure: COLONOSCOPY;  Surgeon: Rogene Houston, MD;  [facility];  Service: Endoscopy;  Laterality: N/A;  730-moved to 830 [address] to notify pt  . JOINT REPLACEMENT  2008   left hip  . TOTAL HIP ARTHROPLASTY Left Left Hip Replacement    There were no vitals filed for this visit.      Subjective Assessment - 12/18/16 1040    Subjective Patient reports no pain or issues.  Only having some pain if she walks greater than 15 minutes.   Currently in Pain? No/denies            [facility] PT Assessment - 12/18/16 0001      Assessment   Medical Diagnosis pre op for Rt THR     Precautions   Precautions Posterior Hip     Single Leg Stance   Comments B LE 15 seconds each (was 10")      Sit to Stand   Comments repeated without UE assist (was unable to complete without UE)     Strength   Right Hip Flexion 4/5  was 3/5   Right Hip Extension 4/5  was 3/5   Right Hip ABduction 5/5   Left Hip Flexion 5/5  was 4/5   Left Hip Extension 4-/5  was 3/5   Left Hip ABduction 4/5   was 4/5   Right Knee Flexion 5/5  was 3/5   Right Knee Extension 5/5   Left Knee Flexion 4+/5  was 3+/5   Left Knee Extension 5/5   Right Ankle Dorsiflexion 5/5   Left Ankle Dorsiflexion 5/5     High Level Balance   High Level Balance Comments TUG 10.22 seconds no AD (was 30" with walker)                     [facility] Adult PT Treatment/Exercise - 12/18/16 0001      Knee/Hip Exercises: Standing   Heel Raises Both;15 reps;1 set   Heel Raises Limitations heel and toe    Stairs x2 reps 6" steps reciprocally without HR   SLS B max 15"   Gait Training no AD   Other Standing Knee Exercises tandem gt on balance beam.    Other Standing Knee Exercises marching in place x 10, retro and tandem gt      Knee/Hip Exercises: Seated   Sit to Sand 10 reps     Manual Therapy   Manual Therapy Myofascial release  Manual therapy comments completed seperately from all other skilled interventions, completed at beginning of session   Soft tissue mobilization to decrease scar tissue anterior hip region and thigh                  PT Short Term Goals - 12/18/16 1106      PT SHORT TERM GOAL #1   Title Pt to be able to state her hip precautions to prevent dislocation of her right THR   Baseline 7/30- able to restate anterior hip precautions successfully; reports no other restrictions per MD    Time 1   Period Weeks   Status Achieved     PT SHORT TERM GOAL #2   Title Pt to state that her pain in her right hip  is no greater than a 4/10 to allow pt to walk with least assitive device for 15 minutes to complete short shopping trips.   Baseline 7/30- pain is 4/10, patient reoprts susccess at shopping trip    Time 3   Period Weeks   Status Achieved     PT SHORT TERM GOAL #3   Title Pt Rt LE strength to be improved 1/2 grade to allow pt to come from sit to stand with one hand assist    Baseline 7/30- LE strength has improved, hip strength remains weak per functional assessment     Time 3   Period Weeks   Status Achieved     PT SHORT TERM GOAL #4   Title Pt to be able to single leg stance for 15 seconds on both LE to reduce risk of falls    Baseline 7/30- DNT    Time 3   Period Weeks   Status Achieved           PT Long Term Goals - 12/18/16 1107      PT LONG TERM GOAL #1   Title Pt to state that her pain in her right hip  is never greater than a 2/10 to allow pt to walk with no  assistve device for at least 25mnutes to be able to complete shopping ..Marland Kitchen  Baseline 7/30- ongoing    Time 6   Period Weeks   Status On-going     PT LONG TERM GOAL #2   Title Pt to be able to stand for 30 mintues to be able to make a meal    Baseline 7/30- reports able to stand with 30 min now without difficulty    Time 6   Period Weeks   Status Achieved     PT LONG TERM GOAL #3   Title Pt to be able to single leg stance on both legs for 30 seconds to be confident in walking on uneven terrain.    Baseline 7/30- DNT    Time 6   Period Weeks   Status On-going     PT LONG TERM GOAL #4   Title Pt mm strength in her right leg to be increased one grade to allow pt to go up and down steps in a reciprocal manner.    Baseline 7/30-LE strength has improved but stairs remain a problem    Time 4   Period Weeks   Status Achieved               Plan - 12/18/16 1143    Clinical Impression Statement quick recheck and review of goals this session with continued improvments noted progression towards goals.  Pt has now met  all STG's and 2/4 LTG's.  PT has improved her TUG, SLS time, can ambulate without AD and completing stairs reciprocally without UE assist.  Pt wtih residual hip weakness bilaterally and needs continued work on her dynamic balance.  Anticipate discharge in next 2 weeks.    Rehab Potential Good   PT Frequency 2x / week   PT Duration 6 weeks   PT Treatment/Interventions ADLs/Self Care Home Management;Patient/family education;Therapeutic exercise;Therapeutic  activities;Stair training;Gait training;Functional mobility training;Manual techniques   PT Next Visit Plan Continue to focus on dynamic balance and gait without AD.  Add hurdles with balance beam and foam surface with vector actvity next treatment.    PT Home Exercise Plan hip abduction and extension as well as SLS; 7/30: adjusted- bridges, sts no UEs, hip abduction with pillow between LEs    Consulted and Agree with Plan of Care Patient      Patient will benefit from skilled therapeutic intervention in order to improve the following deficits and impairments:  Decreased activity tolerance, Decreased balance, Difficulty walking, Decreased range of motion, Decreased strength, Pain  Visit Diagnosis: Muscle weakness (generalized)  Difficulty in walking, not elsewhere classified  Pain in right hip  Abnormal gait     Problem List There are no active problems to display for this patient.  Teena Irani, PTA/CLT [phone]  Teena Irani 12/18/2016, 11:49 AM  [place], [place], [address] Phone: [phone]   Fax:  [phone]  Name: ROSELIA SNIPE MRN: [id number] Date of Birth: 1935-12-23

## 2016-12-22 ENCOUNTER — Ambulatory Visit (HOSPITAL_COMMUNITY): Payer: Medicare Other | Admitting: Physical Therapy

## 2016-12-22 DIAGNOSIS — R262 Difficulty in walking, not elsewhere classified: Secondary | ICD-10-CM | POA: Diagnosis not present

## 2016-12-22 DIAGNOSIS — R269 Unspecified abnormalities of gait and mobility: Secondary | ICD-10-CM | POA: Diagnosis not present

## 2016-12-22 DIAGNOSIS — M6281 Muscle weakness (generalized): Secondary | ICD-10-CM

## 2016-12-22 DIAGNOSIS — M25551 Pain in right hip: Secondary | ICD-10-CM | POA: Diagnosis not present

## 2016-12-22 NOTE — Therapy (Signed)
[place], [place], [address] Phone: [phone]   Fax:  [phone]  Physical Therapy Treatment  Patient Details  Name: Carol Gutierrez MRN: [id number] Date of Birth: March 10, 1936 Referring Provider: Kathe Mariner  Encounter Date: 12/22/2016      PT End of Session - 12/22/16 1116    Visit Number 8   Number of Visits 13   Date for PT Re-Evaluation 01/01/17   Authorization Type medicare   Authorization - Visit Number 8   Authorization - Number of Visits 13   PT Start Time 9381   PT Stop Time 1117   PT Time Calculation (min) 41 min   Activity Tolerance Patient tolerated treatment well   Behavior During Therapy [facility] for tasks assessed/performed      Past Medical History:  Diagnosis Date  . Arthritis    hands and back  . Hypertension     Past Surgical History:  Procedure Laterality Date  . COLONOSCOPY N/A 09/08/2012   Procedure: COLONOSCOPY;  Surgeon: Rogene Houston, MD;  [facility];  Service: Endoscopy;  Laterality: N/A;  730-moved to 830 [address] to notify pt  . JOINT REPLACEMENT  2008   left hip  . TOTAL HIP ARTHROPLASTY Left Left Hip Replacement    There were no vitals filed for this visit.      Subjective Assessment - 12/22/16 1101    Subjective Pt states that she is doing everything that she wants to be doing.  She went to [employer] and bought flowers.  States that she wears loose pants due to tight pants irritating her scar.    Pertinent History Fx LT tibial platau ; Lt humeral fx.   Pain Score 2    Pain Location Hip   Pain Orientation Right   Pain Descriptors / Indicators Burning;Aching   Pain Type Acute pain   Pain Onset More than a month ago   Pain Frequency Intermittent   Aggravating Factors  activity    Pain Relieving Factors rest    Effect of Pain on Daily Activities increases                          [facility] Adult PT Treatment/Exercise - 12/22/16 0001       Knee/Hip Exercises: Standing   Stairs x2 reps 6" steps reciprocally without HR   Other Standing Knee Exercises abduction against wall B x 10      Knee/Hip Exercises: Seated   Sit to Sand 10 reps  no hands keeping weight equal on both LE              Balance Exercises - 12/22/16 1104      Balance Exercises: Standing   SLS with Vectors Foam/compliant surface;3 reps;10 secs   Tandem Gait Forward;Retro;Foam/compliant surface;2 reps  forward over 6" hudles then lunge the rest of the way   Sidestepping Foam/compliant support;2 reps  over 6" hurdles     Steps x 3 RT        PT Education - 12/22/16 1116    Education provided Yes   Education Details desensitization of scar   Person(s) Educated Patient   Methods Explanation   Comprehension Verbalized understanding          PT Short Term Goals - 12/18/16 1106      PT SHORT TERM GOAL #1   Title Pt to be able to state her hip precautions to prevent  dislocation of her right THR   Baseline 7/30- able to restate anterior hip precautions successfully; reports no other restrictions per MD    Time 1   Period Weeks   Status Achieved     PT SHORT TERM GOAL #2   Title Pt to state that her pain in her right hip  is no greater than a 4/10 to allow pt to walk with least assitive device for 15 minutes to complete short shopping trips.   Baseline 7/30- pain is 4/10, patient reoprts susccess at shopping trip    Time 3   Period Weeks   Status Achieved     PT SHORT TERM GOAL #3   Title Pt Rt LE strength to be improved 1/2 grade to allow pt to come from sit to stand with one hand assist    Baseline 7/30- LE strength has improved, hip strength remains weak per functional assessment    Time 3   Period Weeks   Status Achieved     PT SHORT TERM GOAL #4   Title Pt to be able to single leg stance for 15 seconds on both LE to reduce risk of falls    Baseline 7/30- DNT    Time 3   Period Weeks   Status Achieved           PT  Long Term Goals - 12/18/16 1107      PT LONG TERM GOAL #1   Title Pt to state that her pain in her right hip  is never greater than a 2/10 to allow pt to walk with no  assistve device for at least 43minutes to be able to complete shopping .Marland Kitchen   Baseline 7/30- ongoing    Time 6   Period Weeks   Status On-going     PT LONG TERM GOAL #2   Title Pt to be able to stand for 30 mintues to be able to make a meal    Baseline 7/30- reports able to stand with 30 min now without difficulty    Time 6   Period Weeks   Status Achieved     PT LONG TERM GOAL #3   Title Pt to be able to single leg stance on both legs for 30 seconds to be confident in walking on uneven terrain.    Baseline 7/30- DNT    Time 6   Period Weeks   Status On-going     PT LONG TERM GOAL #4   Title Pt mm strength in her right leg to be increased one grade to allow pt to go up and down steps in a reciprocal manner.    Baseline 7/30-LE strength has improved but stairs remain a problem    Time 4   Period Weeks   Status Achieved               Plan - 12/22/16 1117    Clinical Impression Statement Carol Gutierrez main limitation at this time is balance.  Added difficulty to balance activity as well as educated how to decrease scar activity.    Rehab Potential Good   PT Frequency 2x / week   PT Duration 6 weeks   PT Treatment/Interventions ADLs/Self Care Home Management;Patient/family education;Therapeutic exercise;Therapeutic activities;Stair training;Gait training;Functional mobility training;Manual techniques   PT Next Visit Plan Continue to focus on dynamic balance and gait without AD.  Reassess at visit 10    PT Home Exercise Plan hip abduction and extension as well as SLS; 7/30: adjusted-  bridges, sts no UEs, hip abduction with pillow between LEs    Consulted and Agree with Plan of Care Patient      Patient will benefit from skilled therapeutic intervention in order to improve the following deficits and  impairments:  Decreased activity tolerance, Decreased balance, Difficulty walking, Decreased range of motion, Decreased strength, Pain  Visit Diagnosis: Muscle weakness (generalized)  Difficulty in walking, not elsewhere classified  Pain in right hip  Abnormal gait     Problem List There are no active problems to display for this patient.   Rayetta Humphrey, [place] CLT [phone] 12/22/2016, 11:20 AM  [place], [place], [address] Phone: [phone]   Fax:  [phone]  Name: Carol Gutierrez MRN: [id number] Date of Birth: [birth year]

## 2016-12-25 ENCOUNTER — Ambulatory Visit (HOSPITAL_COMMUNITY): Payer: Medicare Other | Admitting: Physical Therapy

## 2016-12-25 DIAGNOSIS — M6281 Muscle weakness (generalized): Secondary | ICD-10-CM

## 2016-12-25 DIAGNOSIS — R262 Difficulty in walking, not elsewhere classified: Secondary | ICD-10-CM | POA: Diagnosis not present

## 2016-12-25 DIAGNOSIS — R269 Unspecified abnormalities of gait and mobility: Secondary | ICD-10-CM | POA: Diagnosis not present

## 2016-12-25 DIAGNOSIS — M25551 Pain in right hip: Secondary | ICD-10-CM

## 2016-12-25 NOTE — Therapy (Signed)
[facility] [address] [place], [place], [address] Phone: [phone]   Fax:  [phone]  Physical Therapy Treatment  Patient Details  Name: Carol Gutierrez MRN: [id number] Date of Birth: 1935/08/02 Referring Provider: Kathe Mariner  Encounter Date: 12/25/2016      PT End of Session - 12/25/16 1217    Visit Number 9   Number of Visits 13   Date for PT Re-Evaluation 01/01/17   Authorization Type medicare   Authorization - Visit Number 9   Authorization - Number of Visits 13   PT Start Time 2595   PT Stop Time 1120   PT Time Calculation (min) 42 min   Activity Tolerance Patient tolerated treatment well   Behavior During Therapy [facility] for tasks assessed/performed      Past Medical History:  Diagnosis Date  . Arthritis    hands and back  . Hypertension     Past Surgical History:  Procedure Laterality Date  . COLONOSCOPY N/A 09/08/2012   Procedure: COLONOSCOPY;  Surgeon: Rogene Houston, MD;  [facility];  Service: Endoscopy;  Laterality: N/A;  730-moved to 830 [address] to notify pt  . JOINT REPLACEMENT  2008   left hip  . TOTAL HIP ARTHROPLASTY Left Left Hip Replacement    There were no vitals filed for this visit.      Subjective Assessment - 12/25/16 1042    Subjective Pt continue to do well.  She is going to try and cook some she made bread yesterday.   Pertinent History Fx LT tibial platau ; Lt humeral fx.   Currently in Pain? Yes   Pain Score 2    Pain Location Hip   Pain Orientation Right   Pain Onset More than a month ago   Aggravating Factors  sleeping on it    Pain Relieving Factors rest    Effect of Pain on Daily Activities increases                               Balance Exercises - 12/25/16 1055      Balance Exercises: Standing   Tandem Stance Foam/compliant surface;3 reps  with head turns    SLS with Vectors Foam/compliant surface;3 reps;10 secs   Rockerboard  Anterior/posterior;Lateral;Other (comment)  2' only    Tandem Gait Forward;Retro;Foam/compliant surface;2 reps  forward over  4 and 6" hudles x 2 RT; lunge walking x 2 RT    Retro Gait Foam/compliant surface;2 reps   Sidestepping Foam/compliant support;2 reps  over 6" hurdles              PT Short Term Goals - 12/18/16 1106      PT SHORT TERM GOAL #1   Title Pt to be able to state her hip precautions to prevent dislocation of her right THR   Baseline 7/30- able to restate anterior hip precautions successfully; reports no other restrictions per MD    Time 1   Period Weeks   Status Achieved     PT SHORT TERM GOAL #2   Title Pt to state that her pain in her right hip  is no greater than a 4/10 to allow pt to walk with least assitive device for 15 minutes to complete short shopping trips.   Baseline 7/30- pain is 4/10, patient reoprts susccess at shopping trip    Time 3   Period Weeks   Status Achieved  PT SHORT TERM GOAL #3   Title Pt Rt LE strength to be improved 1/2 grade to allow pt to come from sit to stand with one hand assist    Baseline 7/30- LE strength has improved, hip strength remains weak per functional assessment    Time 3   Period Weeks   Status Achieved     PT SHORT TERM GOAL #4   Title Pt to be able to single leg stance for 15 seconds on both LE to reduce risk of falls    Baseline 7/30- DNT    Time 3   Period Weeks   Status Achieved           PT Long Term Goals - 12/18/16 1107      PT LONG TERM GOAL #1   Title Pt to state that her pain in her right hip  is never greater than a 2/10 to allow pt to walk with no  assistve device for at least 81minutes to be able to complete shopping .Marland Kitchen   Baseline 7/30- ongoing    Time 6   Period Weeks   Status On-going     PT LONG TERM GOAL #2   Title Pt to be able to stand for 30 mintues to be able to make a meal    Baseline 7/30- reports able to stand with 30 min now without difficulty    Time 6   Period  Weeks   Status Achieved     PT LONG TERM GOAL #3   Title Pt to be able to single leg stance on both legs for 30 seconds to be confident in walking on uneven terrain.    Baseline 7/30- DNT    Time 6   Period Weeks   Status On-going     PT LONG TERM GOAL #4   Title Pt mm strength in her right leg to be increased one grade to allow pt to go up and down steps in a reciprocal manner.    Baseline 7/30-LE strength has improved but stairs remain a problem    Time 4   Period Weeks   Status Achieved               Plan - 12/25/16 1217    Clinical Impression Statement Treatment focused on balance with pt needing moderate assist with tandem stance on foam with head turns.     Rehab Potential Good   PT Frequency 2x / week   PT Duration 6 weeks   PT Treatment/Interventions ADLs/Self Care Home Management;Patient/family education;Therapeutic exercise;Therapeutic activities;Stair training;Gait training;Functional mobility training;Manual techniques   PT Next Visit Plan Reassess with G-codes next visit.   PT Home Exercise Plan hip abduction and extension as well as SLS; 7/30: adjusted- bridges, sts no UEs, hip abduction with pillow between LEs    Consulted and Agree with Plan of Care Patient      Patient will benefit from skilled therapeutic intervention in order to improve the following deficits and impairments:  Decreased activity tolerance, Decreased balance, Difficulty walking, Decreased range of motion, Decreased strength, Pain  Visit Diagnosis: Muscle weakness (generalized)  Difficulty in walking, not elsewhere classified  Pain in right hip     Problem List There are no active problems to display for this patient.  Rayetta Humphrey, PT CLT [phone] 12/25/2016, 12:19 PM  [facility] [address] [place], [place], [address] Phone: [phone]   Fax:  [phone]  Name: Carol Gutierrez MRN:  [id number] Date of Birth:  25-Aug-1935

## 2016-12-29 ENCOUNTER — Ambulatory Visit (HOSPITAL_COMMUNITY): Payer: Medicare Other | Admitting: Physical Therapy

## 2016-12-29 DIAGNOSIS — R262 Difficulty in walking, not elsewhere classified: Secondary | ICD-10-CM

## 2016-12-29 DIAGNOSIS — R269 Unspecified abnormalities of gait and mobility: Secondary | ICD-10-CM | POA: Diagnosis not present

## 2016-12-29 DIAGNOSIS — M6281 Muscle weakness (generalized): Secondary | ICD-10-CM

## 2016-12-29 DIAGNOSIS — M25551 Pain in right hip: Secondary | ICD-10-CM | POA: Diagnosis not present

## 2016-12-29 NOTE — Therapy (Signed)
[facility] [place], [place], [address] Phone: [phone]   Fax:  [phone]  Physical Therapy Treatment  Patient Details  Name: Carol Gutierrez MRN: [id number] Date of Birth: [birth year] Referring Provider: Kathe Mariner   Encounter Date: 12/29/2016      PT End of Session - 12/29/16 1053    Visit Number 10   Number of Visits 10   Date for PT Re-Evaluation 01/01/17   Authorization Type medicare   Authorization - Visit Number 10   Authorization - Number of Visits 10   PT Start Time 1033   PT Stop Time 1100   PT Time Calculation (min) 27 min   Activity Tolerance Patient tolerated treatment well   Behavior During Therapy [facility] for tasks assessed/performed      Past Medical History:  Diagnosis Date  . Arthritis    hands and back  . Hypertension     Past Surgical History:  Procedure Laterality Date  . COLONOSCOPY N/A 09/08/2012   Procedure: COLONOSCOPY;  Surgeon: Rogene Houston, MD;  [facility];  Service: Endoscopy;  Laterality: N/A;  730-moved to 830 [address] to notify pt  . JOINT REPLACEMENT  2008   left hip  . TOTAL HIP ARTHROPLASTY Left Left Hip Replacement    There were no vitals filed for this visit.      Subjective Assessment - 12/29/16 1036    Subjective Pt states that she is having a little trouble getting up from lower heights but she is able to do it.     Pertinent History Fx LT tibial platau ; Lt humeral fx.   How long can you sit comfortably? Able to sit as long as she wants    How long can you stand comfortably? Able to stand for an hour.    How long can you walk comfortably? able to walk without an assistive device at this time.  She does not walk for endurance because she does not need or want to not because she can't.   Patient Stated Goals met    Currently in Pain? No/denies  With prolong walking, greater than 20 minutes, it will begin to bother her.    Pain Onset More than a month  ago            [facility] PT Assessment - 12/29/16 0001      Assessment   Medical Diagnosis  Rt THR   Referring Provider Maxwell Lanfitt    Onset Date/Surgical Date 11/27/16  surgery date    Prior Therapy yes but not for this episode      Precautions   Precautions Posterior Hip  will have      Restrictions   Weight Bearing Restrictions Yes     [place] residence   Home Access Ramped entrance     Prior Function   Level of Independence Independent   Leisure pictures, playing instruments      Cognition   Overall Cognitive Status Within Functional Limits for tasks assessed     Functional Tests   Functional tests Single leg stance;Sit to Stand     Single Leg Stance   Comments B LE 15 seconds each      Sit to Stand   Comments sit to stand x 5 in 14.5      Strength   Overall Strength Within functional limits for tasks performed   Right Hip Flexion 5/5  was 3/5 at  eval   Right Hip Extension 5/5  was 3/5 at evaluation   Right Hip ABduction 5/5   Left Hip Flexion 5/5   Left Hip Extension 5/5  was 3/5 at evaluation    Left Hip ABduction 4/5   Right Knee Flexion --  5- was a 3/5 at evaluation    Right Knee Extension 5/5   Left Knee Flexion 5/5   Left Knee Extension 5/5   Right Ankle Dorsiflexion 5/5   Left Ankle Dorsiflexion 5/5                             PT Education - 12/29/16 1103    Education provided Yes   Education Details the importance of continuing balance activity at home everyday whether that be single leg stance or tandem stance to decrease risk of falling    Person(s) Educated Patient   Methods Explanation   Comprehension Verbalized understanding;Returned demonstration          PT Short Term Goals - 12/29/16 1052      PT SHORT TERM GOAL #1   Title Pt to be able to state her hip precautions to prevent dislocation of her right THR   Baseline 7/30- able to restate anterior hip  precautions successfully; reports no other restrictions per MD    Time 1   Period Weeks   Status Achieved     PT SHORT TERM GOAL #2   Title Pt to state that her pain in her right hip  is no greater than a 4/10 to allow pt to walk with least assitive device for 15 minutes to complete short shopping trips.   Baseline 7/30- pain is 4/10, patient reoprts susccess at shopping trip    Time 3   Period Weeks   Status Achieved     PT SHORT TERM GOAL #3   Title Pt Rt LE strength to be improved 1/2 grade to allow pt to come from sit to stand with one hand assist    Baseline 7/30- LE strength has improved, hip strength remains weak per functional assessment    Time 3   Period Weeks   Status Achieved     PT SHORT TERM GOAL #4   Title Pt to be able to single leg stance for 15 seconds on both LE to reduce risk of falls    Baseline 7/30- DNT    Time 3   Period Weeks   Status Achieved           PT Long Term Goals - 12/29/16 1053      PT LONG TERM GOAL #1   Title Pt to state that her pain in her right hip  is never greater than a 2/10 to allow pt to walk with no  assistve device for at least 51mnutes to be able to complete shopping ..Marland Kitchen  Baseline 7/30- ongoing    Time 6   Period Weeks   Status Achieved     PT LONG TERM GOAL #2   Title Pt to be able to stand for 30 mintues to be able to make a meal    Baseline 7/30- reports able to stand with 30 min now without difficulty    Time 6   Period Weeks   Status Achieved     PT LONG TERM GOAL #3   Title Pt to be able to single leg stance on both legs for 30 seconds to be confident  in walking on uneven terrain.    Baseline 7/30- DNT    Time 6   Period Weeks   Status On-going     PT LONG TERM GOAL #4   Title Pt mm strength in her right leg to be increased one grade to allow pt to go up and down steps in a reciprocal manner.    Time 4   Period Weeks   Status Achieved             Patient will benefit from skilled therapeutic  intervention in order to improve the following deficits and impairments:     Visit Diagnosis: Muscle weakness (generalized)  Difficulty in walking, not elsewhere classified       G-Codes - 2017/01/06 1104    Functional Assessment Tool Used (Outpatient Only) clinical judgement; subjective, strerngth and pain level    Mobility: Walking and Moving Around Current Status (F9094) At least 1 percent but less than 20 percent impaired, limited or restricted   Mobility: Walking and Moving Around Goal Status (O0050) At least 20 percent but less than 40 percent impaired, limited or restricted      Problem List There are no active problems to display for this patient.  Rayetta Humphrey, PT CLT [phone] 01-06-2017, 11:05 AM  [facility] [place], [place], [address] Phone: [phone]   Fax:  [phone]  Name: Carol Gutierrez MRN: [id number] Date of Birth: Jul 05, 1935  PHYSICAL THERAPY DISCHARGE SUMMARY  Visits from Start of Care: 10 Current functional level related to goals / functional outcomes: As above   Remaining deficits: As above   Education / Equipment: As above Plan: Patient agrees to discharge.  Patient goals were met. Patient is being discharged due to meeting the stated rehab goals.  ?????        Rayetta Humphrey, [place] CLT [phone]

## 2017-01-01 ENCOUNTER — Ambulatory Visit (HOSPITAL_COMMUNITY): Payer: Medicare Other | Admitting: Physical Therapy

## 2017-01-12 DIAGNOSIS — M8580 Other specified disorders of bone density and structure, unspecified site: Secondary | ICD-10-CM | POA: Diagnosis not present

## 2017-01-12 DIAGNOSIS — Z96641 Presence of right artificial hip joint: Secondary | ICD-10-CM | POA: Diagnosis not present

## 2017-01-12 DIAGNOSIS — Z96643 Presence of artificial hip joint, bilateral: Secondary | ICD-10-CM | POA: Diagnosis not present

## 2017-01-12 DIAGNOSIS — Z471 Aftercare following joint replacement surgery: Secondary | ICD-10-CM | POA: Diagnosis not present

## 2017-01-29 DIAGNOSIS — Z23 Encounter for immunization: Secondary | ICD-10-CM | POA: Diagnosis not present

## 2017-03-09 DIAGNOSIS — Z888 Allergy status to other drugs, medicaments and biological substances status: Secondary | ICD-10-CM | POA: Diagnosis not present

## 2017-03-09 DIAGNOSIS — I10 Essential (primary) hypertension: Secondary | ICD-10-CM | POA: Diagnosis not present

## 2017-03-09 DIAGNOSIS — Z471 Aftercare following joint replacement surgery: Secondary | ICD-10-CM | POA: Diagnosis not present

## 2017-03-09 DIAGNOSIS — Z96641 Presence of right artificial hip joint: Secondary | ICD-10-CM | POA: Diagnosis not present

## 2017-03-09 DIAGNOSIS — Z885 Allergy status to narcotic agent status: Secondary | ICD-10-CM | POA: Diagnosis not present

## 2017-03-09 DIAGNOSIS — E782 Mixed hyperlipidemia: Secondary | ICD-10-CM | POA: Diagnosis not present

## 2017-03-09 DIAGNOSIS — Z8673 Personal history of transient ischemic attack (TIA), and cerebral infarction without residual deficits: Secondary | ICD-10-CM | POA: Diagnosis not present

## 2017-05-13 DIAGNOSIS — G464 Cerebellar stroke syndrome: Secondary | ICD-10-CM | POA: Diagnosis not present

## 2017-05-13 DIAGNOSIS — I10 Essential (primary) hypertension: Secondary | ICD-10-CM | POA: Diagnosis not present

## 2017-05-13 DIAGNOSIS — E785 Hyperlipidemia, unspecified: Secondary | ICD-10-CM | POA: Diagnosis not present

## 2017-05-25 DIAGNOSIS — E785 Hyperlipidemia, unspecified: Secondary | ICD-10-CM | POA: Diagnosis not present

## 2017-05-25 DIAGNOSIS — I10 Essential (primary) hypertension: Secondary | ICD-10-CM | POA: Diagnosis not present

## 2017-05-25 DIAGNOSIS — Z23 Encounter for immunization: Secondary | ICD-10-CM | POA: Diagnosis not present

## 2017-05-25 DIAGNOSIS — Z6828 Body mass index (BMI) 28.0-28.9, adult: Secondary | ICD-10-CM | POA: Diagnosis not present

## 2017-05-25 DIAGNOSIS — M1990 Unspecified osteoarthritis, unspecified site: Secondary | ICD-10-CM | POA: Diagnosis not present

## 2017-05-29 DIAGNOSIS — H40013 Open angle with borderline findings, low risk, bilateral: Secondary | ICD-10-CM | POA: Diagnosis not present

## 2017-05-29 DIAGNOSIS — H26492 Other secondary cataract, left eye: Secondary | ICD-10-CM | POA: Diagnosis not present

## 2017-05-29 DIAGNOSIS — H35033 Hypertensive retinopathy, bilateral: Secondary | ICD-10-CM | POA: Diagnosis not present

## 2017-05-29 DIAGNOSIS — Z961 Presence of intraocular lens: Secondary | ICD-10-CM | POA: Diagnosis not present

## 2017-08-27 ENCOUNTER — Encounter (INDEPENDENT_AMBULATORY_CARE_PROVIDER_SITE_OTHER): Payer: Self-pay | Admitting: *Deleted

## 2017-08-31 ENCOUNTER — Ambulatory Visit (INDEPENDENT_AMBULATORY_CARE_PROVIDER_SITE_OTHER): Payer: Medicare Other | Admitting: Otolaryngology

## 2017-08-31 DIAGNOSIS — R070 Pain in throat: Secondary | ICD-10-CM | POA: Diagnosis not present

## 2017-08-31 DIAGNOSIS — K219 Gastro-esophageal reflux disease without esophagitis: Secondary | ICD-10-CM

## 2017-08-31 DIAGNOSIS — R490 Dysphonia: Secondary | ICD-10-CM | POA: Diagnosis not present

## 2017-10-12 ENCOUNTER — Ambulatory Visit (INDEPENDENT_AMBULATORY_CARE_PROVIDER_SITE_OTHER): Payer: Medicare Other | Admitting: Otolaryngology

## 2017-10-12 DIAGNOSIS — R490 Dysphonia: Secondary | ICD-10-CM | POA: Diagnosis not present

## 2017-10-12 DIAGNOSIS — K219 Gastro-esophageal reflux disease without esophagitis: Secondary | ICD-10-CM | POA: Diagnosis not present

## 2017-11-09 DIAGNOSIS — I10 Essential (primary) hypertension: Secondary | ICD-10-CM | POA: Diagnosis not present

## 2017-11-09 DIAGNOSIS — Z8673 Personal history of transient ischemic attack (TIA), and cerebral infarction without residual deficits: Secondary | ICD-10-CM | POA: Diagnosis not present

## 2017-12-22 DIAGNOSIS — D225 Melanocytic nevi of trunk: Secondary | ICD-10-CM | POA: Diagnosis not present

## 2017-12-22 DIAGNOSIS — L820 Inflamed seborrheic keratosis: Secondary | ICD-10-CM | POA: Diagnosis not present

## 2018-01-11 DIAGNOSIS — Z886 Allergy status to analgesic agent status: Secondary | ICD-10-CM | POA: Diagnosis not present

## 2018-01-11 DIAGNOSIS — Z471 Aftercare following joint replacement surgery: Secondary | ICD-10-CM | POA: Diagnosis not present

## 2018-01-11 DIAGNOSIS — Z96641 Presence of right artificial hip joint: Secondary | ICD-10-CM | POA: Diagnosis not present

## 2018-01-11 DIAGNOSIS — Z885 Allergy status to narcotic agent status: Secondary | ICD-10-CM | POA: Diagnosis not present

## 2018-01-11 DIAGNOSIS — Z888 Allergy status to other drugs, medicaments and biological substances status: Secondary | ICD-10-CM | POA: Diagnosis not present

## 2018-01-14 ENCOUNTER — Ambulatory Visit (INDEPENDENT_AMBULATORY_CARE_PROVIDER_SITE_OTHER): Payer: Medicare Other | Admitting: Otolaryngology

## 2018-01-14 DIAGNOSIS — R490 Dysphonia: Secondary | ICD-10-CM | POA: Diagnosis not present

## 2018-01-14 DIAGNOSIS — K219 Gastro-esophageal reflux disease without esophagitis: Secondary | ICD-10-CM

## 2018-01-22 DIAGNOSIS — Z23 Encounter for immunization: Secondary | ICD-10-CM | POA: Diagnosis not present

## 2018-04-19 DIAGNOSIS — I491 Atrial premature depolarization: Secondary | ICD-10-CM | POA: Diagnosis not present

## 2018-04-19 DIAGNOSIS — I10 Essential (primary) hypertension: Secondary | ICD-10-CM | POA: Diagnosis not present

## 2018-04-19 DIAGNOSIS — R55 Syncope and collapse: Secondary | ICD-10-CM | POA: Diagnosis not present

## 2018-04-21 ENCOUNTER — Ambulatory Visit (INDEPENDENT_AMBULATORY_CARE_PROVIDER_SITE_OTHER): Payer: Medicare Other

## 2018-04-21 ENCOUNTER — Ambulatory Visit: Payer: Medicare Other

## 2018-04-21 DIAGNOSIS — R55 Syncope and collapse: Secondary | ICD-10-CM

## 2018-04-29 DIAGNOSIS — R55 Syncope and collapse: Secondary | ICD-10-CM | POA: Diagnosis not present

## 2018-06-08 DIAGNOSIS — G464 Cerebellar stroke syndrome: Secondary | ICD-10-CM | POA: Diagnosis not present

## 2018-06-08 DIAGNOSIS — Z79899 Other long term (current) drug therapy: Secondary | ICD-10-CM | POA: Diagnosis not present

## 2018-06-08 DIAGNOSIS — I10 Essential (primary) hypertension: Secondary | ICD-10-CM | POA: Diagnosis not present

## 2018-06-08 DIAGNOSIS — M810 Age-related osteoporosis without current pathological fracture: Secondary | ICD-10-CM | POA: Diagnosis not present

## 2018-06-10 DIAGNOSIS — H40013 Open angle with borderline findings, low risk, bilateral: Secondary | ICD-10-CM | POA: Diagnosis not present

## 2018-06-10 DIAGNOSIS — H35033 Hypertensive retinopathy, bilateral: Secondary | ICD-10-CM | POA: Diagnosis not present

## 2018-06-10 DIAGNOSIS — Z961 Presence of intraocular lens: Secondary | ICD-10-CM | POA: Diagnosis not present

## 2018-06-10 DIAGNOSIS — H26492 Other secondary cataract, left eye: Secondary | ICD-10-CM | POA: Diagnosis not present

## 2018-06-14 DIAGNOSIS — I10 Essential (primary) hypertension: Secondary | ICD-10-CM | POA: Diagnosis not present

## 2018-06-14 DIAGNOSIS — Z6828 Body mass index (BMI) 28.0-28.9, adult: Secondary | ICD-10-CM | POA: Diagnosis not present

## 2018-06-14 DIAGNOSIS — M810 Age-related osteoporosis without current pathological fracture: Secondary | ICD-10-CM | POA: Diagnosis not present

## 2018-06-14 DIAGNOSIS — E785 Hyperlipidemia, unspecified: Secondary | ICD-10-CM | POA: Diagnosis not present

## 2018-06-14 DIAGNOSIS — Z8673 Personal history of transient ischemic attack (TIA), and cerebral infarction without residual deficits: Secondary | ICD-10-CM | POA: Diagnosis not present

## 2018-06-23 ENCOUNTER — Other Ambulatory Visit (HOSPITAL_COMMUNITY): Payer: Self-pay | Admitting: Internal Medicine

## 2018-06-23 ENCOUNTER — Telehealth (INDEPENDENT_AMBULATORY_CARE_PROVIDER_SITE_OTHER): Payer: Self-pay | Admitting: *Deleted

## 2018-06-23 DIAGNOSIS — Z1231 Encounter for screening mammogram for malignant neoplasm of breast: Secondary | ICD-10-CM

## 2018-06-23 NOTE — Telephone Encounter (Signed)
Patient is on recall for 5 yr TCS, hx polyps, fam hx colon ca (sister in her 46's) -- patient is wondering if she need to have TCS due to her age -- please advise

## 2018-06-27 NOTE — Telephone Encounter (Signed)
I would recommend colonoscopy this year given her family and personal history. If she wants to please schedule office visit with me and we can discuss this further.

## 2018-06-28 NOTE — Telephone Encounter (Signed)
Patient aware, she will mail in questionnaire

## 2018-07-01 ENCOUNTER — Encounter (HOSPITAL_COMMUNITY): Payer: Self-pay

## 2018-07-01 ENCOUNTER — Ambulatory Visit (HOSPITAL_COMMUNITY)
Admission: RE | Admit: 2018-07-01 | Discharge: 2018-07-01 | Disposition: A | Discharge status: Home / Self Care | Payer: Medicare Other | Source: Ambulatory Visit | Attending: Internal Medicine | Admitting: Internal Medicine

## 2018-07-01 ENCOUNTER — Ambulatory Visit (HOSPITAL_COMMUNITY): Payer: Medicare Other

## 2018-07-01 DIAGNOSIS — Z1231 Encounter for screening mammogram for malignant neoplasm of breast: Secondary | ICD-10-CM | POA: Diagnosis not present

## 2018-07-06 ENCOUNTER — Encounter: Payer: Self-pay | Admitting: Adult Health

## 2018-08-29 ENCOUNTER — Encounter: Payer: Self-pay | Admitting: Adult Health

## 2018-09-13 DIAGNOSIS — Z8673 Personal history of transient ischemic attack (TIA), and cerebral infarction without residual deficits: Secondary | ICD-10-CM | POA: Diagnosis not present

## 2018-09-13 DIAGNOSIS — I10 Essential (primary) hypertension: Secondary | ICD-10-CM | POA: Diagnosis not present

## 2018-11-11 ENCOUNTER — Other Ambulatory Visit (INDEPENDENT_AMBULATORY_CARE_PROVIDER_SITE_OTHER): Payer: Self-pay | Admitting: Internal Medicine

## 2018-11-11 DIAGNOSIS — Z860100 Personal history of colon polyps, unspecified: Secondary | ICD-10-CM | POA: Insufficient documentation

## 2018-11-11 DIAGNOSIS — Z800 Family history of malignant neoplasm of digestive organs: Secondary | ICD-10-CM

## 2018-12-01 ENCOUNTER — Encounter (INDEPENDENT_AMBULATORY_CARE_PROVIDER_SITE_OTHER): Payer: Self-pay | Admitting: *Deleted

## 2018-12-01 ENCOUNTER — Telehealth (INDEPENDENT_AMBULATORY_CARE_PROVIDER_SITE_OTHER): Payer: Self-pay | Admitting: *Deleted

## 2018-12-01 NOTE — Telephone Encounter (Signed)
Referring MD/PCP: fagan   Procedure: tcs  Reason/Indication:  Hx polyps, fam hx colon ca  Has patient had this procedure before?  Yes, 09/2012  If so, when, by whom and where?    Is there a family history of colon cancer?  Yes, sister  Who?  What age when diagnosed?    Is patient diabetic?   no      Does patient have prosthetic heart valve or mechanical valve?  no  Do you have a pacemaker?  no  Has patient ever had endocarditis? no  Has patient had joint replacement within last 12 months?  no  Is patient constipated or do they take laxatives? no  Does patient have a history of alcohol/drug use?  no  Is patient on blood thinner such as Coumadin, Plavix and/or Aspirin? yes  Medications: asa 81 mg daily, atorvastatin 40 mg daily, lisinopril 10 mg daily  Allergies: oxycodone  Medication Adjustment per Dr Lindi Adie, NP: asa 2 days  Procedure date & time: 01/05/19 at 100

## 2018-12-01 NOTE — Telephone Encounter (Signed)
Patient needs trilyte 

## 2018-12-02 MED ORDER — PEG 3350-KCL-NA BICARB-NACL 420 G PO SOLR: 4000.0000 mL | Freq: Once | ORAL | 0 refills | Status: AC

## 2018-12-02 NOTE — Telephone Encounter (Signed)
agree

## 2018-12-10 DIAGNOSIS — H40013 Open angle with borderline findings, low risk, bilateral: Secondary | ICD-10-CM | POA: Diagnosis not present

## 2019-01-03 ENCOUNTER — Other Ambulatory Visit (HOSPITAL_COMMUNITY)
Admission: RE | Admit: 2019-01-03 | Discharge: 2019-01-03 | Disposition: A | Discharge status: Home / Self Care | Payer: Medicare Other | Source: Ambulatory Visit | Attending: Internal Medicine | Admitting: Internal Medicine

## 2019-01-03 ENCOUNTER — Other Ambulatory Visit: Payer: Self-pay

## 2019-01-03 DIAGNOSIS — Z20828 Contact with and (suspected) exposure to other viral communicable diseases: Secondary | ICD-10-CM | POA: Insufficient documentation

## 2019-01-03 DIAGNOSIS — Z8371 Family history of colonic polyps: Secondary | ICD-10-CM | POA: Insufficient documentation

## 2019-01-03 DIAGNOSIS — Z86010 Personal history of colonic polyps: Secondary | ICD-10-CM | POA: Diagnosis not present

## 2019-01-03 DIAGNOSIS — Z01812 Encounter for preprocedural laboratory examination: Secondary | ICD-10-CM | POA: Insufficient documentation

## 2019-01-03 DIAGNOSIS — K5790 Diverticulosis of intestine, part unspecified, without perforation or abscess without bleeding: Secondary | ICD-10-CM | POA: Insufficient documentation

## 2019-01-03 DIAGNOSIS — K6289 Other specified diseases of anus and rectum: Secondary | ICD-10-CM | POA: Diagnosis not present

## 2019-01-03 LAB — SARS CORONAVIRUS 2 (TAT 6-24 HRS): SARS Coronavirus 2: NEGATIVE

## 2019-01-05 ENCOUNTER — Ambulatory Visit (HOSPITAL_COMMUNITY)
Admission: RE | Admit: 2019-01-05 | Discharge: 2019-01-05 | Disposition: A | Discharge status: Home / Self Care | Payer: Medicare Other | Attending: Internal Medicine | Admitting: Internal Medicine

## 2019-01-05 ENCOUNTER — Other Ambulatory Visit: Payer: Self-pay

## 2019-01-05 ENCOUNTER — Encounter (HOSPITAL_COMMUNITY): Payer: Self-pay | Admitting: *Deleted

## 2019-01-05 ENCOUNTER — Encounter (HOSPITAL_COMMUNITY)
Admission: RE | Disposition: A | Discharge status: Home / Self Care | Payer: Self-pay | Source: Home / Self Care | Attending: Internal Medicine

## 2019-01-05 DIAGNOSIS — Z8249 Family history of ischemic heart disease and other diseases of the circulatory system: Secondary | ICD-10-CM | POA: Diagnosis not present

## 2019-01-05 DIAGNOSIS — M19041 Primary osteoarthritis, right hand: Secondary | ICD-10-CM | POA: Diagnosis not present

## 2019-01-05 DIAGNOSIS — E785 Hyperlipidemia, unspecified: Secondary | ICD-10-CM | POA: Insufficient documentation

## 2019-01-05 DIAGNOSIS — Z7982 Long term (current) use of aspirin: Secondary | ICD-10-CM | POA: Diagnosis not present

## 2019-01-05 DIAGNOSIS — D123 Benign neoplasm of transverse colon: Secondary | ICD-10-CM | POA: Insufficient documentation

## 2019-01-05 DIAGNOSIS — Z800 Family history of malignant neoplasm of digestive organs: Secondary | ICD-10-CM | POA: Diagnosis not present

## 2019-01-05 DIAGNOSIS — Z1211 Encounter for screening for malignant neoplasm of colon: Secondary | ICD-10-CM | POA: Diagnosis not present

## 2019-01-05 DIAGNOSIS — D1779 Benign lipomatous neoplasm of other sites: Secondary | ICD-10-CM | POA: Diagnosis not present

## 2019-01-05 DIAGNOSIS — Z860100 Personal history of colon polyps, unspecified: Secondary | ICD-10-CM | POA: Insufficient documentation

## 2019-01-05 DIAGNOSIS — D175 Benign lipomatous neoplasm of intra-abdominal organs: Secondary | ICD-10-CM | POA: Diagnosis not present

## 2019-01-05 DIAGNOSIS — D125 Benign neoplasm of sigmoid colon: Secondary | ICD-10-CM | POA: Diagnosis not present

## 2019-01-05 DIAGNOSIS — M810 Age-related osteoporosis without current pathological fracture: Secondary | ICD-10-CM | POA: Insufficient documentation

## 2019-01-05 DIAGNOSIS — Z96642 Presence of left artificial hip joint: Secondary | ICD-10-CM | POA: Insufficient documentation

## 2019-01-05 DIAGNOSIS — Z888 Allergy status to other drugs, medicaments and biological substances status: Secondary | ICD-10-CM | POA: Insufficient documentation

## 2019-01-05 DIAGNOSIS — I10 Essential (primary) hypertension: Secondary | ICD-10-CM | POA: Insufficient documentation

## 2019-01-05 DIAGNOSIS — K644 Residual hemorrhoidal skin tags: Secondary | ICD-10-CM | POA: Diagnosis not present

## 2019-01-05 DIAGNOSIS — M4690 Unspecified inflammatory spondylopathy, site unspecified: Secondary | ICD-10-CM | POA: Diagnosis not present

## 2019-01-05 DIAGNOSIS — Z885 Allergy status to narcotic agent status: Secondary | ICD-10-CM | POA: Insufficient documentation

## 2019-01-05 DIAGNOSIS — K5730 Diverticulosis of large intestine without perforation or abscess without bleeding: Secondary | ICD-10-CM | POA: Diagnosis not present

## 2019-01-05 DIAGNOSIS — M19042 Primary osteoarthritis, left hand: Secondary | ICD-10-CM | POA: Insufficient documentation

## 2019-01-05 DIAGNOSIS — K6289 Other specified diseases of anus and rectum: Secondary | ICD-10-CM | POA: Diagnosis not present

## 2019-01-05 DIAGNOSIS — K635 Polyp of colon: Secondary | ICD-10-CM | POA: Diagnosis not present

## 2019-01-05 DIAGNOSIS — Z79899 Other long term (current) drug therapy: Secondary | ICD-10-CM | POA: Insufficient documentation

## 2019-01-05 HISTORY — PX: COLONOSCOPY: SHX5424

## 2019-01-05 HISTORY — PX: POLYPECTOMY: SHX5525

## 2019-01-05 SURGERY — COLONOSCOPY
Anesthesia: Moderate Sedation

## 2019-01-05 MED ORDER — MEPERIDINE HCL 50 MG/ML IJ SOLN
INTRAMUSCULAR | Status: DC | PRN
  Administered 2019-01-05: 15 mg via INTRAVENOUS
  Administered 2019-01-05: 25 mg via INTRAVENOUS

## 2019-01-05 MED ORDER — SODIUM CHLORIDE 0.9 % IV SOLN
INTRAVENOUS | Status: DC
  Administered 2019-01-05: 11:00:00 via INTRAVENOUS

## 2019-01-05 MED ORDER — MIDAZOLAM HCL 5 MG/5ML IJ SOLN
INTRAMUSCULAR | Status: DC | PRN
  Administered 2019-01-05: 2 mg via INTRAVENOUS
  Administered 2019-01-05: 1 mg via INTRAVENOUS

## 2019-01-05 MED ORDER — STERILE WATER FOR IRRIGATION IR SOLN
Status: DC | PRN
  Administered 2019-01-05: 12:00:00 2.5 mL

## 2019-01-05 MED ORDER — MEPERIDINE HCL 50 MG/ML IJ SOLN
INTRAMUSCULAR | Status: AC
  Filled 2019-01-05: qty 1

## 2019-01-05 MED ORDER — MIDAZOLAM HCL 5 MG/5ML IJ SOLN
INTRAMUSCULAR | Status: AC
  Filled 2019-01-05: qty 10

## 2019-01-05 NOTE — Op Note (Signed)
[facility] Patient Name: Carol Gutierrez Procedure Date: 01/05/2019 11:14 AM MRN: [id number] Date of Birth: [birth year] Attending MD: Hildred Laser , MD CSN: [id number] Age: 83 Admit Type: Outpatient Procedure:                Colonoscopy Indications:              High risk colon cancer surveillance: Personal                            history of colonic polyps Providers:                Hildred Laser, MD, Otis Peak B. Sharon Seller, RN, Raphael Gibney, Technician Referring MD:             Asencion Noble, MD Medicines:                Meperidine 40 mg IV, Midazolam 3 mg IV Complications:            No immediate complications. Estimated Blood Loss:     Estimated blood loss was minimal. Procedure:                Pre-Anesthesia Assessment:                           - Prior to the procedure, a History and Physical                            was performed, and patient medications and                            allergies were reviewed. The patient's tolerance of                            previous anesthesia was also reviewed. The risks                            and benefits of the procedure and the sedation                            options and risks were discussed with the patient.                            All questions were answered, and informed consent                            was obtained. Prior Anticoagulants: The patient has                            taken no previous anticoagulant or antiplatelet                            agents except for aspirin. ASA Grade Assessment: II                            -  A patient with mild systemic disease. After                            reviewing the risks and benefits, the patient was                            deemed in satisfactory condition to undergo the                            procedure.                           After obtaining informed consent, the colonoscope                            was passed under direct  vision. Throughout the                            procedure, the patient's blood pressure, pulse, and                            oxygen saturations were monitored continuously. The                            [id number] scope was introduced through                            the anus and advanced to the the cecum, identified                            by appendiceal orifice and ileocecal valve. The                            colonoscopy was performed without difficulty. The                            patient tolerated the procedure well. The quality                            of the bowel preparation was good. The ileocecal                            valve, appendiceal orifice, and rectum were                            photographed. Scope In: 11:41:45 AM Scope Out: 12:04:10 PM Scope Withdrawal Time: 0 hours 14 minutes 52 seconds  Total Procedure Duration: 0 hours 22 minutes 25 seconds  Findings:      The perianal and digital rectal examinations were normal.      There was a small lipoma, at the hepatic flexure.      A 6 mm polyp was found in the hepatic flexure. The polyp was removed       with a cold snare. Resection and retrieval were complete. The pathology  specimen was placed into Bottle Number 1.      A 6 mm polyp was found in the distal sigmoid colon. The polyp was       sessile. The polyp was removed with a hot snare. Resection and retrieval       were complete. The pathology specimen was placed into Bottle Number 2.      Scattered diverticula were found in the sigmoid colon.      External hemorrhoids were found during retroflexion. The hemorrhoids       were small.      Anal papilla(e) were hypertrophied. Impression:               - Small lipoma at the hepatic flexure.                           - One 6 mm polyp at the hepatic flexure, removed                            with a cold snare. Resected and retrieved.                           - One 6 mm polyp in the  distal sigmoid colon,                            removed with a hot snare. Resected and retrieved.                           - Diverticulosis in the sigmoid colon.                           - External hemorrhoids.                           - Anal papilla(e) were hypertrophied. Moderate Sedation:      Moderate (conscious) sedation was administered by the endoscopy nurse       and supervised by the endoscopist. The following parameters were       monitored: oxygen saturation, heart rate, blood pressure, CO2       capnography and response to care. Total physician intraservice time was       26 minutes. Recommendation:           - Patient has a contact number available for                            emergencies. The signs and symptoms of potential                            delayed complications were discussed with the                            patient. Return to normal activities tomorrow.                            Written discharge instructions were provided to the  patient.                           - High fiber diet today.                           - Continue present medications.                           - No aspirin, ibuprofen, naproxen, or other                            non-steroidal anti-inflammatory drugs for 7 days.                           - Await pathology results.                           - No recommendation at this time regarding repeat                            colonoscopy. Procedure Code(s):        --- Professional ---                           [id number], Colonoscopy, flexible; with removal of                            tumor(s), polyp(s), or other lesion(s) by snare                            technique                           99153, Moderate sedation; each additional 15                            minutes intraservice time                           G0500, Moderate sedation services provided by the                            same physician or  other qualified health care                            professional performing a gastrointestinal                            endoscopic service that sedation supports,                            requiring the presence of an independent trained                            observer to assist in the monitoring of the  patient's level of consciousness and physiological                            status; initial 15 minutes of intra-service time;                            patient age 29 years or older (additional time may                            be reported with [phone], as appropriate) Diagnosis Code(s):        --- Professional ---                           Z86.010, Personal history of colonic polyps                           D17.5, Benign lipomatous neoplasm of                            intra-abdominal organs                           K63.5, Polyp of colon                           K64.4, Residual hemorrhoidal skin tags                           K62.89, Other specified diseases of anus and rectum                           K57.30, Diverticulosis of large intestine without                            perforation or abscess without bleeding CPT copyright 2019 [employer]. All rights reserved. The codes documented in this report are preliminary and upon coder review may  be revised to meet current compliance requirements. Hildred Laser, MD Hildred Laser, MD 01/05/2019 12:27:30 PM This report has been signed electronically. Number of Addenda: 0

## 2019-01-05 NOTE — H&P (Signed)
Carol Gutierrez is an 83 y.o. female.   Chief Complaint: Patient is here for colonoscopy. HPI: Patient is 83 year old Caucasian female who has history of sessile serrated polyp and a tubular adenoma who is here for surveillance colonoscopy.  Last exam was in May 2014.  Family history is positive for colon carcinoma in her sister who was 105 at the time of surgery and is doing fine at age 31.  Patient denies abdominal pain change in bowel habits or rectal bleeding.  Past Medical History:  Diagnosis Date  . Arthritis    hands and back  . Hypertension        Hyperlipidemia.      Osteoporosis.  Past Surgical History:  Procedure Laterality Date  . COLONOSCOPY N/A 09/08/2012   Procedure: COLONOSCOPY;  Surgeon: Rogene Houston, MD;  Location: [facility];  Service: Endoscopy;  Laterality: N/A;  730-moved to [address] to notify pt  . JOINT REPLACEMENT  2008   left hip  . TOTAL HIP ARTHROPLASTY Left Left Hip Replacement    Family History  Problem Relation Age of Onset  . Arthritis Mother   . Diabetes type II Father   . CAD Father   . Colon cancer Sister   . Hypertension Brother    Social History:  reports that she has never smoked. She has never used smokeless tobacco. She reports current alcohol use. She reports that she does not use drugs.  Allergies:  Allergies  Allergen Reactions  . Lisinopril Cough  . Hydrocodone-Acetaminophen Itching and Rash    Medications Prior to Admission  Medication Sig Dispense Refill  . alendronate (FOSAMAX) 70 MG tablet Take 70 mg by mouth once a week. Take with a full glass of water on an empty stomach.    Marland Kitchen aspirin EC 81 MG tablet Take 81 mg by mouth at bedtime.     Marland Kitchen atorvastatin (LIPITOR) 40 MG tablet Take 40 mg by mouth every evening.     Marland Kitchen lisinopril (ZESTRIL) 20 MG tablet Take 20 mg by mouth every evening.      No results found for this or any previous visit (from the past 48 hour(s)). No results found.  ROS  Blood pressure (!) 158/78,  pulse 68, temperature 98.7 F (37.1 C), temperature source Oral, resp. rate 18, height 5\' 3"  (1.6 m), weight 70.3 kg, SpO2 100 %. Physical Exam  Constitutional: She appears well-developed and well-nourished.  HENT:  Mouth/Throat: Oropharynx is clear and moist.  Eyes: Conjunctivae are normal. No scleral icterus.  Neck: No thyromegaly present.  Cardiovascular: Normal rate, regular rhythm and normal heart sounds.  No murmur heard. Respiratory: Effort normal and breath sounds normal.  GI:  Abdomen is symmetrical.  On palpation is soft and nontender.  A small subcutaneous soft lesion in right upper quadrant felt to be a lipoma.  No organomegaly or masses.  Musculoskeletal:        General: No edema.  Neurological: She is alert.  Skin: Skin is warm and dry.     Assessment/Plan History of colonic polyps. Family history of CRC in first-degree relative. Surveillance colonoscopy.  Hildred Laser, MD 01/05/2019, 11:31 AM

## 2019-01-05 NOTE — Discharge Instructions (Signed)
No aspirin or NSAIDs for 1 week. Resume other medications as before.. High-fiber diet. No driving for 24 hours. Patient will call with biopsy results.   Colonoscopy, Adult, Care After This sheet gives you information about how to care for yourself after your procedure. Your doctor may also give you more specific instructions. If you have problems or questions, call your doctor. What can I expect after the procedure? After the procedure, it is common to have:  A small amount of blood in your poop for 24 hours.  Some gas.  Mild cramping or bloating in your belly. Follow these instructions at home: General instructions  For the first 24 hours after the procedure: ? Do not drive or use machinery. ? Do not sign important documents. ? Do not drink alcohol. ? Do your daily activities more slowly than normal. ? Eat foods that are soft and easy to digest.  Take over-the-counter or prescription medicines only as told by your doctor. To help cramping and bloating:   Try walking around.  Put heat on your belly (abdomen) as told by your doctor. Use a heat source that your doctor recommends, such as a moist heat pack or a heating pad. ? Put a towel between your skin and the heat source. ? Leave the heat on for 20-30 minutes. ? Remove the heat if your skin turns bright red. This is especially important if you cannot feel pain, heat, or cold. You can get burned. Eating and drinking   Drink enough fluid to keep your pee (urine) clear or pale yellow.  Return to your normal diet as told by your doctor. Avoid heavy or fried foods that are hard to digest.  Avoid drinking alcohol for as long as told by your doctor. Contact a doctor if:  You have blood in your poop (stool) 2-3 days after the procedure. Get help right away if:  You have more than a small amount of blood in your poop.  You see large clumps of tissue (blood clots) in your poop.  Your belly is swollen.  You feel sick to  your stomach (nauseous).  You throw up (vomit).  You have a fever.  You have belly pain that gets worse, and medicine does not help your pain. Summary  After the procedure, it is common to have a small amount of blood in your poop. You may also have mild cramping and bloating in your belly.  For the first 24 hours after the procedure, do not drive or use machinery, do not sign important documents, and do not drink alcohol.  Get help right away if you have a lot of blood in your poop, feel sick to your stomach, have a fever, or have more belly pain. This information is not intended to replace advice given to you by your health care provider. Make sure you discuss any questions you have with your health care provider. Document Released: 05/24/2010 Document Revised: 02/19/2017 Document Reviewed: 01/14/2016 Elsevier Patient Education  2020 [employer].  Hemorrhoids Hemorrhoids are swollen veins in and around the rectum or anus. There are two types of hemorrhoids:  Internal hemorrhoids. These occur in the veins that are just inside the rectum. They may poke through to the outside and become irritated and painful.  External hemorrhoids. These occur in the veins that are outside the anus and can be felt as a painful swelling or hard lump near the anus. Most hemorrhoids do not cause serious problems, and they can be managed with home  treatments such as diet and lifestyle changes. If home treatments do not help the symptoms, procedures can be done to shrink or remove the hemorrhoids. What are the causes? This condition is caused by increased pressure in the anal area. This pressure may result from various things, including:  Constipation.  Straining to have a bowel movement.  Diarrhea.  Pregnancy.  Obesity.  Sitting for long periods of time.  Heavy lifting or other activity that causes you to strain.  Anal sex.  Riding a bike for a long period of time. What are the signs or  symptoms? Symptoms of this condition include:  Pain.  Anal itching or irritation.  Rectal bleeding.  Leakage of stool (feces).  Anal swelling.  One or more lumps around the anus. How is this diagnosed? This condition can often be diagnosed through a visual exam. Other exams or tests may also be done, such as:  An exam that involves feeling the rectal area with a gloved hand (digital rectal exam).  An exam of the anal canal that is done using a small tube (anoscope).  A blood test, if you have lost a significant amount of blood.  A test to look inside the colon using a flexible tube with a camera on the end (sigmoidoscopy or colonoscopy). How is this treated? This condition can usually be treated at home. However, various procedures may be done if dietary changes, lifestyle changes, and other home treatments do not help your symptoms. These procedures can help make the hemorrhoids smaller or remove them completely. Some of these procedures involve surgery, and others do not. Common procedures include:  Rubber band ligation. Rubber bands are placed at the base of the hemorrhoids to cut off their blood supply.  Sclerotherapy. Medicine is injected into the hemorrhoids to shrink them.  Infrared coagulation. A type of light energy is used to get rid of the hemorrhoids.  Hemorrhoidectomy surgery. The hemorrhoids are surgically removed, and the veins that supply them are tied off.  Stapled hemorrhoidopexy surgery. The surgeon staples the base of the hemorrhoid to the rectal wall. Follow these instructions at home: Eating and drinking   Eat foods that have a lot of fiber in them, such as whole grains, beans, nuts, fruits, and vegetables.  Ask your health care provider about taking products that have added fiber (fiber supplements).  Reduce the amount of fat in your diet. You can do this by eating low-fat dairy products, eating less red meat, and avoiding processed foods.  Drink  enough fluid to keep your urine pale yellow. Managing pain and swelling   Take warm sitz baths for 20 minutes, 3-4 times a day to ease pain and discomfort. You may do this in a bathtub or using a portable sitz bath that fits over the toilet.  If directed, apply ice to the affected area. Using ice packs between sitz baths may be helpful. ? Put ice in a plastic bag. ? Place a towel between your skin and the bag. ? Leave the ice on for 20 minutes, 2-3 times a day. General instructions  Take over-the-counter and prescription medicines only as told by your health care provider.  Use medicated creams or suppositories as told.  Get regular exercise. Ask your health care provider how much and what kind of exercise is best for you. In general, you should do moderate exercise for at least 30 minutes on most days of the week (150 minutes each week). This can include activities such as walking, biking,  or yoga.  Go to the bathroom when you have the urge to have a bowel movement. Do not wait.  Avoid straining to have bowel movements.  Keep the anal area dry and clean. Use wet toilet paper or moist towelettes after a bowel movement.  Do not sit on the toilet for long periods of time. This increases blood pooling and pain.  Keep all follow-up visits as told by your health care provider. This is important. Contact a health care provider if you have:  Increasing pain and swelling that are not controlled by treatment or medicine.  Difficulty having a bowel movement, or you are unable to have a bowel movement.  Pain or inflammation outside the area of the hemorrhoids. Get help right away if you have:  Uncontrolled bleeding from your rectum. Summary  Hemorrhoids are swollen veins in and around the rectum or anus.  Most hemorrhoids can be managed with home treatments such as diet and lifestyle changes.  Taking warm sitz baths can help ease pain and discomfort.  In severe cases, procedures or  surgery can be done to shrink or remove the hemorrhoids. This information is not intended to replace advice given to you by your health care provider. Make sure you discuss any questions you have with your health care provider. Document Released: 04/18/2000 Document Revised: 04/29/2018 Document Reviewed: 09/10/2017 Elsevier Patient Education  [place].   High-Fiber Diet Fiber, also called dietary fiber, is a type of carbohydrate that is found in fruits, vegetables, whole grains, and beans. A high-fiber diet can have many health benefits. Your health care provider may recommend a high-fiber diet to help:  Prevent constipation. Fiber can make your bowel movements more regular.  Lower your cholesterol.  Relieve the following conditions: ? Swelling of veins in the anus (hemorrhoids). ? Swelling and irritation (inflammation) of specific areas of the digestive tract (uncomplicated diverticulosis). ? A problem of the large intestine (colon) that sometimes causes pain and diarrhea (irritable bowel syndrome, IBS).  Prevent overeating as part of a weight-loss plan.  Prevent heart disease, type 2 diabetes, and certain cancers. What is my plan? The recommended daily fiber intake in grams (g) includes:  38 g for men age 76 or younger.  30 g for men over age 76.  80 g for women age 108 or younger.  21 g for women over age 51. You can get the recommended daily intake of dietary fiber by:  Eating a variety of fruits, vegetables, grains, and beans.  Taking a fiber supplement, if it is not possible to get enough fiber through your diet. What do I need to know about a high-fiber diet?  It is better to get fiber through food sources rather than from fiber supplements. There is not a lot of research about how effective supplements are.  Always check the fiber content on the nutrition facts label of any prepackaged food. Look for foods that contain 5 g of fiber or more per serving.  Talk  with a diet and nutrition specialist (dietitian) if you have questions about specific foods that are recommended or not recommended for your medical condition, especially if those foods are not listed below.  Gradually increase how much fiber you consume. If you increase your intake of dietary fiber too quickly, you may have bloating, cramping, or gas.  Drink plenty of water. Water helps you to digest fiber. What are tips for following this plan?  Eat a wide variety of high-fiber foods.  Make sure  that half of the grains that you eat each day are whole grains.  Eat breads and cereals that are made with whole-grain flour instead of refined flour or white flour.  Eat brown rice, bulgur wheat, or millet instead of white rice.  Start the day with a breakfast that is high in fiber, such as a cereal that contains 5 g of fiber or more per serving.  Use beans in place of meat in soups, salads, and pasta dishes.  Eat high-fiber snacks, such as berries, raw vegetables, nuts, and popcorn.  Choose whole fruits and vegetables instead of processed forms like juice or sauce. What foods can I eat?  Fruits Berries. Pears. Apples. Oranges. Avocado. Prunes and raisins. Dried figs. Vegetables Sweet potatoes. Spinach. Kale. Artichokes. Cabbage. Broccoli. Cauliflower. Green peas. Carrots. Squash. Grains Whole-grain breads. Multigrain cereal. Oats and oatmeal. Brown rice. Barley. Bulgur wheat. [place]. Quinoa. Bran muffins. Popcorn. Rye wafer crackers. Meats and other proteins Navy, kidney, and pinto beans. Soybeans. Split peas. Lentils. Nuts and seeds. Dairy Fiber-fortified yogurt. Beverages Fiber-fortified soy milk. Fiber-fortified orange juice. Other foods Fiber bars. The items listed above may not be a complete list of recommended foods and beverages. Contact a dietitian for more options. What foods are not recommended? Fruits Fruit juice. Cooked, strained fruit. Vegetables Fried potatoes.  Canned vegetables. Well-cooked vegetables. Grains White bread. Pasta made with refined flour. White rice. Meats and other proteins Fatty cuts of meat. Fried chicken or fried fish. Dairy Milk. Yogurt. Cream cheese. Sour cream. Fats and oils Butters. Beverages Soft drinks. Other foods Cakes and pastries. The items listed above may not be a complete list of foods and beverages to avoid. Contact a dietitian for more information. Summary  Fiber is a type of carbohydrate. It is found in fruits, vegetables, whole grains, and beans.  There are many health benefits of eating a high-fiber diet, such as preventing constipation, lowering blood cholesterol, helping with weight loss, and reducing your risk of heart disease, diabetes, and certain cancers.  Gradually increase your intake of fiber. Increasing too fast can result in cramping, bloating, and gas. Drink plenty of water while you increase your fiber.  The best sources of fiber include whole fruits and vegetables, whole grains, nuts, seeds, and beans. This information is not intended to replace advice given to you by your health care provider. Make sure you discuss any questions you have with your health care provider. Document Released: 04/21/2005 Document Revised: 02/23/2017 Document Reviewed: 02/23/2017 Elsevier Patient Education  [place].   Colon Polyps  Polyps are tissue growths inside the body. Polyps can grow in many places, including the large intestine (colon). A polyp may be a round bump or a mushroom-shaped growth. You could have one polyp or several. Most colon polyps are noncancerous (benign). However, some colon polyps can become cancerous over time. Finding and removing the polyps early can help prevent this. What are the causes? The exact cause of colon polyps is not known. What increases the risk? You are more likely to develop this condition if you:  Have a family history of colon cancer or colon  polyps.  Are older than 43 or older than 45 if you are African American.  Have inflammatory bowel disease, such as ulcerative colitis or Crohn's disease.  Have certain hereditary conditions, such as: ? Familial adenomatous polyposis. ? Lynch syndrome. ? Turcot syndrome. ? Peutz-Jeghers syndrome.  Are overweight.  Smoke cigarettes.  Do not get enough exercise.  Drink too much alcohol.  Eat a diet that is high in fat and red meat and low in fiber.  Had childhood cancer that was treated with abdominal radiation. What are the signs or symptoms? Most polyps do not cause symptoms. If you have symptoms, they may include:  Blood coming from your rectum when having a bowel movement.  Blood in your stool. The stool may look dark red or black.  Abdominal pain.  A change in bowel habits, such as constipation or diarrhea. How is this diagnosed? This condition is diagnosed with a colonoscopy. This is a procedure in which a lighted, flexible scope is inserted into the anus and then passed into the colon to examine the area. Polyps are sometimes found when a colonoscopy is done as part of routine cancer screening tests. How is this treated? Treatment for this condition involves removing any polyps that are found. Most polyps can be removed during a colonoscopy. Those polyps will then be tested for cancer. Additional treatment may be needed depending on the results of testing. Follow these instructions at home: Lifestyle  Maintain a healthy weight, or lose weight if recommended by your health care provider.  Exercise every day or as told by your health care provider.  Do not use any products that contain nicotine or tobacco, such as cigarettes and e-cigarettes. If you need help quitting, ask your health care provider.  If you drink alcohol, limit how much you have: ? 0-1 drink a day for women. ? 0-2 drinks a day for men.  Be aware of how much alcohol is in your drink. In the [place]  one drink equals one 12 oz bottle of beer (355 mL), one 5 oz glass of wine (148 mL), or one 1 oz shot of hard liquor (44 mL). Eating and drinking   Eat foods that are high in fiber, such as fruits, vegetables, and whole grains.  Eat foods that are high in calcium and vitamin D, such as milk, cheese, yogurt, eggs, liver, fish, and broccoli.  Limit foods that are high in fat, such as fried foods and desserts.  Limit the amount of red meat and processed meat you eat, such as hot dogs, sausage, bacon, and lunch meats. General instructions  Keep all follow-up visits as told by your health care provider. This is important. ? This includes having regularly scheduled colonoscopies. ? Talk to your health care provider about when you need a colonoscopy. Contact a health care provider if:  You have new or worsening bleeding during a bowel movement.  You have new or increased blood in your stool.  You have a change in bowel habits.  You lose weight for no known reason. Summary  Polyps are tissue growths inside the body. Polyps can grow in many places, including the colon.  Most colon polyps are noncancerous (benign), but some can become cancerous over time.  This condition is diagnosed with a colonoscopy.  Treatment for this condition involves removing any polyps that are found. Most polyps can be removed during a colonoscopy. This information is not intended to replace advice given to you by your health care provider. Make sure you discuss any questions you have with your health care provider. Document Released: 01/16/2004 Document Revised: 08/06/2017 Document Reviewed: 08/06/2017 Elsevier Patient Education  2020 [employer].  Diverticulosis  Diverticulosis is a condition that develops when small pouches (diverticula) form in the wall of the large intestine (colon). The colon is where water is absorbed and stool is formed. The pouches form when  the inside layer of the colon pushes  through weak spots in the outer layers of the colon. You may have a few pouches or many of them. What are the causes? The cause of this condition is not known. What increases the risk? The following factors may make you more likely to develop this condition:  Being older than age 35. Your risk for this condition increases with age. Diverticulosis is rare among people younger than age 1. By age 65, many people have it.  Eating a low-fiber diet.  Having frequent constipation.  Being overweight.  Not getting enough exercise.  Smoking.  Taking over-the-counter pain medicines, like aspirin and ibuprofen.  Having a family history of diverticulosis. What are the signs or symptoms? In most people, there are no symptoms of this condition. If you do have symptoms, they may include:  Bloating.  Cramps in the abdomen.  Constipation or diarrhea.  Pain in the lower left side of the abdomen. How is this diagnosed? This condition is most often diagnosed during an exam for other colon problems. Because diverticulosis usually has no symptoms, it often cannot be diagnosed independently. This condition may be diagnosed by:  Using a flexible scope to examine the colon (colonoscopy).  Taking an X-ray of the colon after dye has been put into the colon (barium enema).  Doing a CT scan. How is this treated? You may not need treatment for this condition if you have never developed an infection related to diverticulosis. If you have had an infection before, treatment may include:  Eating a high-fiber diet. This may include eating more fruits, vegetables, and grains.  Taking a fiber supplement.  Taking a live bacteria supplement (probiotic).  Taking medicine to relax your colon.  Taking antibiotic medicines. Follow these instructions at home:  Drink 6-8 glasses of water or more each day to prevent constipation.  Try not to strain when you have a bowel movement.  If you have had an  infection before: ? Eat more fiber as directed by your health care provider or your diet and nutrition specialist (dietitian). ? Take a fiber supplement or probiotic, if your health care provider approves.  Take over-the-counter and prescription medicines only as told by your health care provider.  If you were prescribed an antibiotic, take it as told by your health care provider. Do not stop taking the antibiotic even if you start to feel better.  Keep all follow-up visits as told by your health care provider. This is important. Contact a health care provider if:  You have pain in your abdomen.  You have bloating.  You have cramps.  You have not had a bowel movement in 3 days. Get help right away if:  Your pain gets worse.  Your bloating becomes very bad.  You have a fever or chills, and your symptoms suddenly get worse.  You vomit.  You have bowel movements that are bloody or black.  You have bleeding from your rectum. Summary  Diverticulosis is a condition that develops when small pouches (diverticula) form in the wall of the large intestine (colon).  You may have a few pouches or many of them.  This condition is most often diagnosed during an exam for other colon problems.  If you have had an infection related to diverticulosis, treatment may include increasing the fiber in your diet, taking supplements, or taking medicines. This information is not intended to replace advice given to you by your health care provider. Make sure you discuss  any questions you have with your health care provider. Document Released: 01/17/2004 Document Revised: 04/03/2017 Document Reviewed: 03/10/2016 Elsevier Patient Education  2020 [employer].

## 2019-01-07 ENCOUNTER — Encounter (HOSPITAL_COMMUNITY): Payer: Self-pay | Admitting: Internal Medicine

## 2019-01-14 DIAGNOSIS — Z8673 Personal history of transient ischemic attack (TIA), and cerebral infarction without residual deficits: Secondary | ICD-10-CM | POA: Diagnosis not present

## 2019-01-14 DIAGNOSIS — I10 Essential (primary) hypertension: Secondary | ICD-10-CM | POA: Diagnosis not present

## 2019-01-14 DIAGNOSIS — M810 Age-related osteoporosis without current pathological fracture: Secondary | ICD-10-CM | POA: Diagnosis not present

## 2019-01-17 ENCOUNTER — Other Ambulatory Visit (HOSPITAL_COMMUNITY): Payer: Self-pay | Admitting: Internal Medicine

## 2019-01-17 DIAGNOSIS — Z780 Asymptomatic menopausal state: Secondary | ICD-10-CM

## 2019-02-01 ENCOUNTER — Ambulatory Visit (HOSPITAL_COMMUNITY)
Admission: RE | Admit: 2019-02-01 | Discharge: 2019-02-01 | Disposition: A | Discharge status: Home / Self Care | Payer: Medicare Other | Source: Ambulatory Visit | Attending: Internal Medicine | Admitting: Internal Medicine

## 2019-02-01 ENCOUNTER — Other Ambulatory Visit: Payer: Self-pay

## 2019-02-01 DIAGNOSIS — M810 Age-related osteoporosis without current pathological fracture: Secondary | ICD-10-CM | POA: Diagnosis not present

## 2019-02-01 DIAGNOSIS — Z780 Asymptomatic menopausal state: Secondary | ICD-10-CM | POA: Diagnosis not present

## 2019-02-01 DIAGNOSIS — M85831 Other specified disorders of bone density and structure, right forearm: Secondary | ICD-10-CM | POA: Diagnosis not present

## 2019-02-02 DIAGNOSIS — Z23 Encounter for immunization: Secondary | ICD-10-CM | POA: Diagnosis not present

## 2019-04-18 DIAGNOSIS — Z8673 Personal history of transient ischemic attack (TIA), and cerebral infarction without residual deficits: Secondary | ICD-10-CM | POA: Diagnosis not present

## 2019-04-18 DIAGNOSIS — I10 Essential (primary) hypertension: Secondary | ICD-10-CM | POA: Diagnosis not present

## 2019-05-17 DIAGNOSIS — Z23 Encounter for immunization: Secondary | ICD-10-CM | POA: Diagnosis not present

## 2019-06-17 DIAGNOSIS — Z23 Encounter for immunization: Secondary | ICD-10-CM | POA: Diagnosis not present

## 2019-06-30 DIAGNOSIS — E785 Hyperlipidemia, unspecified: Secondary | ICD-10-CM | POA: Diagnosis not present

## 2019-06-30 DIAGNOSIS — I10 Essential (primary) hypertension: Secondary | ICD-10-CM | POA: Diagnosis not present

## 2019-06-30 DIAGNOSIS — Z79899 Other long term (current) drug therapy: Secondary | ICD-10-CM | POA: Diagnosis not present

## 2019-07-04 ENCOUNTER — Other Ambulatory Visit (HOSPITAL_COMMUNITY): Payer: Self-pay | Admitting: Internal Medicine

## 2019-07-04 DIAGNOSIS — R05 Cough: Secondary | ICD-10-CM

## 2019-07-04 DIAGNOSIS — Z8673 Personal history of transient ischemic attack (TIA), and cerebral infarction without residual deficits: Secondary | ICD-10-CM | POA: Diagnosis not present

## 2019-07-04 DIAGNOSIS — M810 Age-related osteoporosis without current pathological fracture: Secondary | ICD-10-CM | POA: Diagnosis not present

## 2019-07-04 DIAGNOSIS — E785 Hyperlipidemia, unspecified: Secondary | ICD-10-CM | POA: Diagnosis not present

## 2019-07-04 DIAGNOSIS — R059 Cough, unspecified: Secondary | ICD-10-CM

## 2019-07-04 DIAGNOSIS — I10 Essential (primary) hypertension: Secondary | ICD-10-CM | POA: Diagnosis not present

## 2019-07-05 ENCOUNTER — Other Ambulatory Visit: Payer: Self-pay

## 2019-07-05 ENCOUNTER — Ambulatory Visit (HOSPITAL_COMMUNITY)
Admission: RE | Admit: 2019-07-05 | Discharge: 2019-07-05 | Disposition: A | Discharge status: Home / Self Care | Payer: Medicare Other | Source: Ambulatory Visit | Attending: Internal Medicine | Admitting: Internal Medicine

## 2019-07-05 DIAGNOSIS — R05 Cough: Secondary | ICD-10-CM | POA: Diagnosis not present

## 2019-07-05 DIAGNOSIS — R059 Cough, unspecified: Secondary | ICD-10-CM

## 2019-08-08 DIAGNOSIS — I7090 Unspecified atherosclerosis: Secondary | ICD-10-CM | POA: Diagnosis not present

## 2019-08-08 DIAGNOSIS — Z96641 Presence of right artificial hip joint: Secondary | ICD-10-CM | POA: Diagnosis not present

## 2019-08-08 DIAGNOSIS — Z7982 Long term (current) use of aspirin: Secondary | ICD-10-CM | POA: Diagnosis not present

## 2019-08-08 DIAGNOSIS — M85861 Other specified disorders of bone density and structure, right lower leg: Secondary | ICD-10-CM | POA: Diagnosis not present

## 2019-08-08 DIAGNOSIS — M8588 Other specified disorders of bone density and structure, other site: Secondary | ICD-10-CM | POA: Diagnosis not present

## 2019-08-08 DIAGNOSIS — M1711 Unilateral primary osteoarthritis, right knee: Secondary | ICD-10-CM | POA: Diagnosis not present

## 2019-08-08 DIAGNOSIS — M25461 Effusion, right knee: Secondary | ICD-10-CM | POA: Diagnosis not present

## 2019-08-08 DIAGNOSIS — Z96643 Presence of artificial hip joint, bilateral: Secondary | ICD-10-CM | POA: Diagnosis not present

## 2019-08-08 DIAGNOSIS — M949 Disorder of cartilage, unspecified: Secondary | ICD-10-CM | POA: Diagnosis not present

## 2019-08-08 DIAGNOSIS — M47898 Other spondylosis, sacral and sacrococcygeal region: Secondary | ICD-10-CM | POA: Diagnosis not present

## 2019-08-08 DIAGNOSIS — Z471 Aftercare following joint replacement surgery: Secondary | ICD-10-CM | POA: Diagnosis not present

## 2019-08-08 DIAGNOSIS — Z01818 Encounter for other preprocedural examination: Secondary | ICD-10-CM | POA: Diagnosis not present

## 2019-08-15 DIAGNOSIS — Z961 Presence of intraocular lens: Secondary | ICD-10-CM | POA: Diagnosis not present

## 2019-08-15 DIAGNOSIS — H04123 Dry eye syndrome of bilateral lacrimal glands: Secondary | ICD-10-CM | POA: Diagnosis not present

## 2019-08-15 DIAGNOSIS — H40013 Open angle with borderline findings, low risk, bilateral: Secondary | ICD-10-CM | POA: Diagnosis not present

## 2019-12-07 DIAGNOSIS — Z8673 Personal history of transient ischemic attack (TIA), and cerebral infarction without residual deficits: Secondary | ICD-10-CM | POA: Diagnosis not present

## 2019-12-07 DIAGNOSIS — M810 Age-related osteoporosis without current pathological fracture: Secondary | ICD-10-CM | POA: Diagnosis not present

## 2019-12-07 DIAGNOSIS — I10 Essential (primary) hypertension: Secondary | ICD-10-CM | POA: Diagnosis not present

## 2019-12-07 DIAGNOSIS — R0609 Other forms of dyspnea: Secondary | ICD-10-CM | POA: Diagnosis not present

## 2020-02-07 ENCOUNTER — Emergency Department (HOSPITAL_COMMUNITY): Payer: Medicare Other

## 2020-02-07 ENCOUNTER — Other Ambulatory Visit: Payer: Self-pay

## 2020-02-07 ENCOUNTER — Encounter (HOSPITAL_COMMUNITY): Payer: Self-pay

## 2020-02-07 ENCOUNTER — Inpatient Hospital Stay (HOSPITAL_COMMUNITY)
Admission: EM | Admit: 2020-02-07 | Discharge: 2020-02-09 | DRG: 312 | Disposition: A | Discharge status: Home / Self Care | Payer: Medicare Other | Attending: Family Medicine | Admitting: Family Medicine

## 2020-02-07 DIAGNOSIS — R112 Nausea with vomiting, unspecified: Secondary | ICD-10-CM

## 2020-02-07 DIAGNOSIS — Z20822 Contact with and (suspected) exposure to covid-19: Secondary | ICD-10-CM | POA: Diagnosis present

## 2020-02-07 DIAGNOSIS — D649 Anemia, unspecified: Secondary | ICD-10-CM | POA: Diagnosis present

## 2020-02-07 DIAGNOSIS — R0689 Other abnormalities of breathing: Secondary | ICD-10-CM | POA: Diagnosis not present

## 2020-02-07 DIAGNOSIS — J90 Pleural effusion, not elsewhere classified: Secondary | ICD-10-CM | POA: Diagnosis not present

## 2020-02-07 DIAGNOSIS — Z96642 Presence of left artificial hip joint: Secondary | ICD-10-CM | POA: Diagnosis present

## 2020-02-07 DIAGNOSIS — R110 Nausea: Secondary | ICD-10-CM | POA: Diagnosis not present

## 2020-02-07 DIAGNOSIS — Z8261 Family history of arthritis: Secondary | ICD-10-CM

## 2020-02-07 DIAGNOSIS — Z79899 Other long term (current) drug therapy: Secondary | ICD-10-CM

## 2020-02-07 DIAGNOSIS — T7840XA Allergy, unspecified, initial encounter: Secondary | ICD-10-CM | POA: Diagnosis not present

## 2020-02-07 DIAGNOSIS — Z7983 Long term (current) use of bisphosphonates: Secondary | ICD-10-CM

## 2020-02-07 DIAGNOSIS — Z888 Allergy status to other drugs, medicaments and biological substances status: Secondary | ICD-10-CM

## 2020-02-07 DIAGNOSIS — R55 Syncope and collapse: Principal | ICD-10-CM | POA: Diagnosis present

## 2020-02-07 DIAGNOSIS — E785 Hyperlipidemia, unspecified: Secondary | ICD-10-CM | POA: Diagnosis present

## 2020-02-07 DIAGNOSIS — N390 Urinary tract infection, site not specified: Secondary | ICD-10-CM | POA: Diagnosis present

## 2020-02-07 DIAGNOSIS — R1111 Vomiting without nausea: Secondary | ICD-10-CM | POA: Diagnosis not present

## 2020-02-07 DIAGNOSIS — I10 Essential (primary) hypertension: Secondary | ICD-10-CM

## 2020-02-07 DIAGNOSIS — Z7982 Long term (current) use of aspirin: Secondary | ICD-10-CM

## 2020-02-07 DIAGNOSIS — Z800 Family history of malignant neoplasm of digestive organs: Secondary | ICD-10-CM

## 2020-02-07 DIAGNOSIS — Z886 Allergy status to analgesic agent status: Secondary | ICD-10-CM

## 2020-02-07 DIAGNOSIS — Z8249 Family history of ischemic heart disease and other diseases of the circulatory system: Secondary | ICD-10-CM

## 2020-02-07 DIAGNOSIS — Z833 Family history of diabetes mellitus: Secondary | ICD-10-CM

## 2020-02-07 LAB — URINALYSIS, ROUTINE W REFLEX MICROSCOPIC
Bacteria, UA: NONE SEEN
Bilirubin Urine: NEGATIVE
Glucose, UA: NEGATIVE mg/dL
Hgb urine dipstick: NEGATIVE
Ketones, ur: NEGATIVE mg/dL
Nitrite: POSITIVE — AB
Protein, ur: NEGATIVE mg/dL
Specific Gravity, Urine: 1.015 (ref 1.005–1.030)
WBC, UA: >50 WBC/hpf — ABNORMAL HIGH (ref 0–5)
pH: 6 (ref 5.0–8.0)

## 2020-02-07 LAB — CBC WITH DIFFERENTIAL/PLATELET
Abs Immature Granulocytes: 0.02 10*3/uL (ref 0.00–0.07)
Basophils Absolute: 0 10*3/uL (ref 0.0–0.1)
Basophils Relative: 1 %
Eosinophils Absolute: 0.2 10*3/uL (ref 0.0–0.5)
Eosinophils Relative: 4 %
HCT: 30.3 % — ABNORMAL LOW (ref 36.0–46.0)
Hemoglobin: 9.1 g/dL — ABNORMAL LOW (ref 12.0–15.0)
Immature Granulocytes: 0 %
Lymphocytes Relative: 30 %
Lymphs Abs: 1.6 10*3/uL (ref 0.7–4.0)
MCH: 22.6 pg — ABNORMAL LOW (ref 26.0–34.0)
MCHC: 30 g/dL (ref 30.0–36.0)
MCV: 75.2 fL — ABNORMAL LOW (ref 80.0–100.0)
Monocytes Absolute: 0.4 10*3/uL (ref 0.1–1.0)
Monocytes Relative: 7 %
Neutro Abs: 3.2 10*3/uL (ref 1.7–7.7)
Neutrophils Relative %: 58 %
Platelets: 272 10*3/uL (ref 150–400)
RBC: 4.03 MIL/uL (ref 3.87–5.11)
RDW: 16.9 % — ABNORMAL HIGH (ref 11.5–15.5)
WBC: 5.4 10*3/uL (ref 4.0–10.5)
nRBC: 0 % (ref 0.0–0.2)

## 2020-02-07 LAB — COMPREHENSIVE METABOLIC PANEL
ALT: 16 U/L (ref 0–44)
AST: 19 U/L (ref 15–41)
Albumin: 3.6 g/dL (ref 3.5–5.0)
Alkaline Phosphatase: 81 U/L (ref 38–126)
Anion gap: 10 (ref 5–15)
BUN: 13 mg/dL (ref 8–23)
CO2: 25 mmol/L (ref 22–32)
Calcium: 9.1 mg/dL (ref 8.9–10.3)
Chloride: 102 mmol/L (ref 98–111)
Creatinine, Ser: 0.98 mg/dL (ref 0.44–1.00)
GFR calc non Af Amer: 54 mL/min — ABNORMAL LOW (ref >60)
Glucose, Bld: 119 mg/dL — ABNORMAL HIGH (ref 70–99)
Potassium: 3.7 mmol/L (ref 3.5–5.1)
Sodium: 137 mmol/L (ref 135–145)
Total Bilirubin: 0.6 mg/dL (ref 0.3–1.2)
Total Protein: 6.9 g/dL (ref 6.5–8.1)

## 2020-02-07 LAB — RESPIRATORY PANEL BY RT PCR (FLU A&B, COVID)
Influenza A by PCR: NEGATIVE
Influenza B by PCR: NEGATIVE
SARS Coronavirus 2 by RT PCR: NEGATIVE

## 2020-02-07 LAB — TROPONIN I (HIGH SENSITIVITY)
Troponin I (High Sensitivity): 3 ng/L (ref <18)
Troponin I (High Sensitivity): 3 ng/L (ref <18)

## 2020-02-07 MED ORDER — ONDANSETRON HCL 4 MG/2ML IJ SOLN: 4.0000 mg | Freq: Four times a day (QID) | INTRAMUSCULAR | Status: DC | PRN

## 2020-02-07 MED ORDER — ACETAMINOPHEN 325 MG PO TABS: 650.0000 mg | ORAL_TABLET | Freq: Four times a day (QID) | ORAL | Status: DC | PRN

## 2020-02-07 MED ORDER — ENOXAPARIN SODIUM 40 MG/0.4ML ~~LOC~~ SOLN
40.0000 mg | SUBCUTANEOUS | Status: DC
  Administered 2020-02-07 – 2020-02-08 (×2): 40 mg via SUBCUTANEOUS
  Filled 2020-02-07 (×2): qty 0.4

## 2020-02-07 MED ORDER — ASPIRIN EC 81 MG PO TBEC
81.0000 mg | DELAYED_RELEASE_TABLET | Freq: Every day | ORAL | Status: DC
  Administered 2020-02-07 – 2020-02-08 (×2): 81 mg via ORAL
  Filled 2020-02-07 (×2): qty 1

## 2020-02-07 MED ORDER — ACETAMINOPHEN 650 MG RE SUPP: 650.0000 mg | Freq: Four times a day (QID) | RECTAL | Status: DC | PRN

## 2020-02-07 MED ORDER — ATORVASTATIN CALCIUM 40 MG PO TABS
40.0000 mg | ORAL_TABLET | Freq: Every evening | ORAL | Status: DC
  Administered 2020-02-07 – 2020-02-08 (×2): 40 mg via ORAL
  Filled 2020-02-07 (×2): qty 1

## 2020-02-07 MED ORDER — ONDANSETRON HCL 4 MG PO TABS: 4.0000 mg | ORAL_TABLET | Freq: Four times a day (QID) | ORAL | Status: DC | PRN

## 2020-02-07 MED ORDER — LISINOPRIL 10 MG PO TABS
20.0000 mg | ORAL_TABLET | Freq: Every evening | ORAL | Status: DC
  Administered 2020-02-07: 20 mg via ORAL
  Filled 2020-02-07: qty 2

## 2020-02-07 MED ORDER — POLYETHYLENE GLYCOL 3350 17 G PO PACK: 17.0000 g | PACK | Freq: Every day | ORAL | Status: DC | PRN

## 2020-02-07 NOTE — ED Provider Notes (Signed)
[facility] EMERGENCY DEPARTMENT Provider Note   CSN: [id number] Arrival date & time: 02/07/20  1410     History Chief Complaint  Patient presents with  . Emesis    Carol Gutierrez is a 84 y.o. female.  Carol Gutierrez is a 84 y.o. female with a history of hypertension and arthritis, who presents via EMS for evaluation of near syncopal episode. Patient states that today she was walking around in a store when she started to feel "funny". She states that she started to feel overall poor and lightheaded, started to feel like things were going black and became very nauseous, she reports she became diaphoretic and shaky and was able to get to a chair at the front of the store to sit down. She reports things went black but she is not sure if she completely lost consciousness. She states when she came to a few minutes later she was still very nauseous and vomited a few times. When EMS arrived patient was noted to be very pale. CBG of 152, patient vomited 2 more times with EMS and was given Zofran in route. She states that she has had similar episodes a few times now over the past few months, most recently a month ago, but she has never come to the hospital for evaluation. She states that she mentioned this to her primary care doctor once who recommended that she come to the hospital immediately the next time this happens. She did not fall to the ground or hit her head. She denies any associated chest pain, shortness of breath or palpitations. She is not sure if she has lost consciousness completely with previous episodes. Patient was worried that this could be an allergic reaction that she thinks she may have to preservatives but she has not had one think she is consistently eaten prior to these episodes occurring. Does not have known history of cardiac arrhythmia or other heart conditions. Denies associated headache, numbness or weakness. No abdominal pain. States with some of these episodes she has  sometimes had bloating and diarrhea immediately afterwards. Denies any hematemesis or blood in the stool. No other aggravating or alleviating factors.        Past Medical History:  Diagnosis Date  . Arthritis    hands and back  . Hypertension     Patient Active Problem List   Diagnosis Date Noted  . Hx of colonic polyps 11/11/2018  . Family hx of colon cancer 11/11/2018    Past Surgical History:  Procedure Laterality Date  . COLONOSCOPY N/A 09/08/2012   Procedure: COLONOSCOPY;  Surgeon: Rogene Houston, MD;  Location: [facility];  Service: Endoscopy;  Laterality: N/A;  730-moved to 830 Ann to notify pt  . COLONOSCOPY N/A 01/05/2019   Procedure: COLONOSCOPY;  Surgeon: Rogene Houston, MD;  Location: [facility];  Service: Endoscopy;  Laterality: N/A;  100  . JOINT REPLACEMENT  2008   left hip  . POLYPECTOMY  01/05/2019   Procedure: POLYPECTOMY;  Surgeon: Rogene Houston, MD;  Location: [facility];  Service: Endoscopy;;  colon  . TOTAL HIP ARTHROPLASTY Left Left Hip Replacement     OB History   No obstetric history on file.     Family History  Problem Relation Age of Onset  . Arthritis Mother   . Diabetes type II Father   . CAD Father   . Colon cancer Sister   . Hypertension Brother     Social History  Tobacco Use  . Smoking status: Never Smoker  . Smokeless tobacco: Never Used  Substance Use Topics  . Alcohol use: Yes    Comment: rare  . Drug use: No    Home Medications Prior to Admission medications   Medication Sig Start Date End Date Taking? Authorizing Provider  alendronate (FOSAMAX) 70 MG tablet Take 70 mg by mouth once a week. Take with a full glass of water on an empty stomach.    [provider]  aspirin EC 81 MG tablet Take 1 tablet (81 mg total) by mouth at bedtime. 01/12/19   Rehman, Mechele Dawley, MD  atorvastatin (LIPITOR) 40 MG tablet Take 40 mg by mouth every evening.     [provider]  lisinopril (ZESTRIL) 20 MG tablet  Take 20 mg by mouth every evening.    [provider]    Allergies    Lisinopril and Hydrocodone-acetaminophen  Review of Systems   Review of Systems  Constitutional: Negative for chills and fever.  HENT: Negative.   Respiratory: Negative for cough and shortness of breath.   Cardiovascular: Negative for chest pain.  Gastrointestinal: Positive for nausea and vomiting. Negative for abdominal distention, abdominal pain, blood in stool and diarrhea.  Genitourinary: Negative for dysuria and frequency.  Musculoskeletal: Negative for arthralgias and myalgias.  Skin: Negative for color change and rash.  Neurological: Positive for syncope (Near-syncope), weakness (Generalized) and light-headedness.  All other systems reviewed and are negative.   Physical Exam Updated Vital Signs BP 132/74 (BP Location: Left Arm)   Pulse 62   Temp 97.9 F (36.6 C) (Oral)   Resp 12   Ht 5\' 3"  (1.6 m)   Wt 71 kg   SpO2 98%   BMI 27.74 kg/m   Physical Exam Vitals and nursing note reviewed.  Constitutional:      General: She is not in acute distress.    Appearance: Normal appearance. She is well-developed. She is not diaphoretic.     Comments: Elderly female slightly pale and mildly ill-appearing but in no acute distress  HENT:     Head: Normocephalic and atraumatic.     Mouth/Throat:     Mouth: Mucous membranes are moist.     Pharynx: Oropharynx is clear.  Eyes:     General:        Right eye: No discharge.        Left eye: No discharge.     Extraocular Movements: Extraocular movements intact.     Pupils: Pupils are equal, round, and reactive to light.  Cardiovascular:     Rate and Rhythm: Regular rhythm.     Heart sounds: Normal heart sounds. No murmur heard.  No friction rub. No gallop.      Comments: Mild bradycardia with regular rhythm Pulmonary:     Effort: Pulmonary effort is normal. No respiratory distress.     Breath sounds: Normal breath sounds. No wheezing or rales.      Comments: Respirations equal and unlabored, patient able to speak in full sentences, lungs clear to auscultation bilaterally Abdominal:     General: Bowel sounds are normal. There is no distension.     Palpations: Abdomen is soft. There is no mass.     Tenderness: There is no abdominal tenderness. There is no guarding.     Comments: Abdomen soft, nondistended, nontender to palpation in all quadrants without guarding or peritoneal signs  Musculoskeletal:        General: No deformity.  Cervical back: Neck supple.     Right lower leg: No edema.     Left lower leg: No edema.  Skin:    General: Skin is warm and dry.     Capillary Refill: Capillary refill takes less than 2 seconds.  Neurological:     Mental Status: She is alert.     Coordination: Coordination normal.     Comments: Speech is clear, able to follow commands CN III-XII intact Normal strength in upper and lower extremities bilaterally including dorsiflexion and plantar flexion, strong and equal grip strength Sensation normal to light and sharp touch Moves extremities without ataxia, coordination intact  Psychiatric:        Mood and Affect: Mood normal.        Behavior: Behavior normal.     ED Results / Procedures / Treatments   Labs (all labs ordered are listed, but only abnormal results are displayed) Labs Reviewed  COMPREHENSIVE METABOLIC PANEL - Abnormal; Notable for the following components:      Result Value   Glucose, Bld 119 (*)    GFR calc non Af Amer 54 (*)    All other components within normal limits  CBC WITH DIFFERENTIAL/PLATELET - Abnormal; Notable for the following components:   Hemoglobin 9.1 (*)    HCT 30.3 (*)    MCV 75.2 (*)    MCH 22.6 (*)    RDW 16.9 (*)    All other components within normal limits  URINALYSIS, ROUTINE W REFLEX MICROSCOPIC - Abnormal; Notable for the following components:  RESPIRATORY PANEL BY RT PCR (FLU A&B, COVID)  CBC  VITAMIN B12  FOLATE  IRON AND TIBC  FERRITIN    RETICULOCYTES  TROPONIN I (HIGH SENSITIVITY)  TROPONIN I (HIGH SENSITIVITY)    EKG None  Radiology DG Chest Port 1 View  Result Date: 02/07/2020 CLINICAL DATA:  Near syncopal episode. Nausea, vomiting, shaking and sweats. EXAM: PORTABLE CHEST 1 VIEW COMPARISON:  07/05/2019 FINDINGS: The cardiac silhouette, mediastinal and hilar contours are within normal limits for age and stable. Moderate tortuosity and calcification of the thoracic aorta. No acute pulmonary findings. No pulmonary lesions or pleural effusions. Stable mild eventration of the right hemidiaphragm. The bony structures are intact. Stable advanced degenerative changes involving the left shoulder. IMPRESSION: No acute cardiopulmonary findings. Electronically Signed   By: Marijo Sanes M.D.   On: 02/07/2020 15:07    Procedures Procedures (including critical care time)  Medications Ordered in ED Medications  aspirin EC tablet 81 mg (has no administration in time range)  atorvastatin (LIPITOR) tablet 40 mg (has no administration in time range)  lisinopril (ZESTRIL) tablet 20 mg (has no administration in time range)  enoxaparin (LOVENOX) injection 40 mg (has no administration in time range)  acetaminophen (TYLENOL) tablet 650 mg (has no administration in time range)    Or  acetaminophen (TYLENOL) suppository 650 mg (has no administration in time range)  ondansetron (ZOFRAN) tablet 4 mg (has no administration in time range)    Or  ondansetron (ZOFRAN) injection 4 mg (has no administration in time range)  polyethylene glycol (MIRALAX / GLYCOLAX) packet 17 g (has no administration in time range)    ED Course  I have reviewed the triage vital signs and the nursing notes.  Pertinent labs & imaging results that were available during my care of the patient were reviewed by me and considered in my medical decision making (see chart for details).    MDM Rules/Calculators/A&P  84 year old female presents  with possible syncopal episode, where she had a prodrome of nausea, lightheadedness and feeling like her vision was going black, she became diaphoretic and shaky, and was able to sit down, is not sure if she completely lost consciousness, states when she came to she was still very nauseous and had multiple episodes of vomiting, did not fall to the ground or hit her head. States she has had multiple similar episodes like this over the past few months but has never been evaluated for such. Had normal CBG on scene, now her only complaint is nausea patient with no associated abdominal pain, chest pain, shortness of breath or palpitations. Given patient's age no with multiple possible syncopal episodes without evaluation will get basic labs, EKG, urinalysis and troponin, but feel patient will likely need admission for syncope work-up.  EKG shows sinus rhythm with heart rate of 58, 1 PAC noted, no ischemic changes  Patient's chest x-ray with no acute cardiopulmonary findings. Initial troponin is normal, patient with glucose of 119 but no other significant electrolyte derangements, normal renal and liver function. CBC shows no leukocytosis, does show a hemoglobin of 9.1, but in review of previous labs it appears that hemoglobin has ranged between 9-11 and patient denies any recent bleeding symptoms, is not on any blood thinners. Given multiple syncope or near syncopal episodes Case discussed with Dr. Vanita Panda who agrees with plan for hospital admission. Medicine consult placed.  Case discussed with Dr. Denton Brick with [facility] who will see and admit the patient for syncope work-up.  Final Clinical Impression(s) / ED Diagnoses Final diagnoses:  Near syncope  Non-intractable vomiting with nausea, unspecified vomiting type    Rx / DC Orders ED Discharge Orders    None       Janet Berlin 02/07/20 2020    Carmin Muskrat, MD 02/08/20 [phone]

## 2020-02-07 NOTE — ED Triage Notes (Signed)
Pt brought to ED via [facility] for sudden onset of nausea and vomiting, followed by shaking and sweats. Pt pale upon arrival of EMS. CBG 152. Pt vomited x 2 with EMS. Zofran 4mg  given by EMS.

## 2020-02-07 NOTE — H&P (Signed)
History and Physical    Carol Gutierrez HYQ:[id number] DOB: 1935/09/04 DOA: 02/07/2020  PCP: Asencion Noble, MD   Patient coming from: HOME  I have personally briefly reviewed patient's old medical records in [place]  Chief Complaint: Presyncope  HPI: Carol Gutierrez is a 84 y.o. female with medical history significant for hypertension.  Patient presented to the ED with complaints of almost passing out today.  Patient's woke up in the morning feeling okay, she went out, ate a few things including store-bought "pig in a blanket", and then she became dizzy, everything turned black, she felt she was going to pass out, but did not actually pass out.  She reports vomiting, diarrhea after feeling better.  She reports during episode she was shaking all over uncontrollably, but she was conscious.  Patient has had similar episodes in the recent past.  She reports over the past 2 to 3 years she has had about 4 episodes of this.  Last episode was about 6 months ago.  Episodes usually occur after she has eaten something store-bought, likely with preservatives, and then about 40 to 45 minutes later she has the same episode of almost passing out, and after vomiting and an episode of diarrhea, she feels back to her normal self.  She feels these episodes are related to food she is eating, that she may be reacting to preservatives in the food.  Except for today she has never had shaking of her extremities.  She has never had these episodes after eating her own home-cooked foods.  These episodes of dizziness usually occur when she is standing. No history of cardiac disease.  No history of seizures. Patient was a [employer] scientist.  Patient son is at bedside.  No chest pain, no difficulty breathing.  ED Course: Temperature 97.9.  Heart rate 62, blood pressure 132/74.  O2 sats 98% on room air.  Portable chest x-ray without acute abnormality.  EKG shows sinus rhythm, without ST or T wave abnormalities.  Hospitalist was  called to admit for presyncopal attacks.  Review of Systems: As per HPI all other systems reviewed and negative.  Past Medical History:  Diagnosis Date  . Arthritis    hands and back  . Hypertension     Past Surgical History:  Procedure Laterality Date  . COLONOSCOPY N/A 09/08/2012   Procedure: COLONOSCOPY;  Surgeon: Rogene Houston, MD;  [facility];  Service: Endoscopy;  Laterality: N/A;  730-moved to 830 Ann to notify pt  . COLONOSCOPY N/A 01/05/2019   Procedure: COLONOSCOPY;  Surgeon: Rogene Houston, MD;  [facility];  Service: Endoscopy;  Laterality: N/A;  100  . JOINT REPLACEMENT  2008   left hip  . POLYPECTOMY  01/05/2019   Procedure: POLYPECTOMY;  Surgeon: Rogene Houston, MD;  [facility];  Service: Endoscopy;;  colon  . TOTAL HIP ARTHROPLASTY Left Left Hip Replacement     reports that she has never smoked. She has never used smokeless tobacco. She reports current alcohol use. She reports that she does not use drugs.  Allergies  Allergen Reactions  . Lisinopril Cough  . Hydrocodone-Acetaminophen Itching and Rash    Family History  Problem Relation Age of Onset  . Arthritis Mother   . Diabetes type II Father   . CAD Father   . Colon cancer Sister   . Hypertension Brother     Prior to Admission medications   Medication Sig Start Date End Date Taking?  Authorizing Provider  alendronate (FOSAMAX) 70 MG tablet Take 70 mg by mouth once a week. Take with a full glass of water on an empty stomach.    [provider]  aspirin EC 81 MG tablet Take 1 tablet (81 mg total) by mouth at bedtime. 01/12/19   Rehman, Mechele Dawley, MD  atorvastatin (LIPITOR) 40 MG tablet Take 40 mg by mouth every evening.     [provider]  lisinopril (ZESTRIL) 20 MG tablet Take 20 mg by mouth every evening.    [provider]    Physical Exam: Vitals:   02/07/20 1414 02/07/20 1417  BP:  132/74  Pulse:  62  Resp:  12  Temp:  97.9 F  (36.6 C)  TempSrc:  Oral  SpO2:  98%  Weight: 71 kg   Height: 5\' 3"  (1.6 m)     Constitutional: NAD, calm, comfortable Vitals:   02/07/20 1414 02/07/20 1417  BP:  132/74  Pulse:  62  Resp:  12  Temp:  97.9 F (36.6 C)  TempSrc:  Oral  SpO2:  98%  Weight: 71 kg   Height: 5\' 3"  (1.6 m)    Eyes: PERRL, lids and conjunctivae normal ENMT: Mucous membranes are moist.  Neck: normal, supple, no masses, no thyromegaly Respiratory: clear to auscultation bilaterally, no wheezing, no crackles. Normal respiratory effort. No accessory muscle use.  Cardiovascular: Regular rate and rhythm, faint 2/6 systolic murmurs , no rubs / gallops. No extremity edema. 2+ pedal pulses. Abdomen: no tenderness, no masses palpated. No hepatosplenomegaly. Bowel sounds positive.  Musculoskeletal: no clubbing / cyanosis. No joint deformity upper and lower extremities. Good ROM, no contractures. Normal muscle tone.  Skin: no rashes, lesions, ulcers. No induration Neurologic: No apparent cranial nerve abnormality, moving extremities spontaneously. Psychiatric: Normal judgment and insight. Alert and oriented x 3. Normal mood.   Labs on Admission: I have personally reviewed following labs and imaging studies  CBC: Recent Labs  Lab 02/07/20 1506  WBC 5.4  NEUTROABS 3.2  HGB 9.1*  HCT 30.3*  MCV 75.2*  PLT 400   Basic Metabolic Panel: Recent Labs  Lab 02/07/20 1506  NA 137  K 3.7  CL 102  CO2 25  GLUCOSE 119*  BUN 13  CREATININE 0.98  CALCIUM 9.1   Liver Function Tests: Recent Labs  Lab 02/07/20 1506  AST 19  ALT 16  ALKPHOS 81  BILITOT 0.6  PROT 6.9  ALBUMIN 3.6   Radiological Exams on Admission: DG Chest Port 1 View  Result Date: 02/07/2020 CLINICAL DATA:  Near syncopal episode. Nausea, vomiting, shaking and sweats. EXAM: PORTABLE CHEST 1 VIEW COMPARISON:  07/05/2019 FINDINGS: The cardiac silhouette, mediastinal and hilar contours are within normal limits for age and stable. Moderate  tortuosity and calcification of the thoracic aorta. No acute pulmonary findings. No pulmonary lesions or pleural effusions. Stable mild eventration of the right hemidiaphragm. The bony structures are intact. Stable advanced degenerative changes involving the left shoulder. IMPRESSION: No acute cardiopulmonary findings. Electronically Signed   By: Marijo Sanes M.D.   On: 02/07/2020 15:07    EKG: Independently reviewed. Sinus, with PAC. No prior EKG to compare.   Assessment/Plan Principal Problem:   Pre-syncope Active Problems:   HTN (hypertension)  Presyncope-patient's fourth or fifth episode in the past 2 to 3 years.  Not previously evaluated.  No cardiac history.  Electrolytes normal.  Sinus rhythm on telemetry. Episodes of occur after eating particular foods.  Troponin 3 x 2, portable  chest x-ray without acute abnormality.  EKG without significant abnormalities no old EKG to compare. -Obtain orthostatic vitals -Obtain echocardiogram -Carotid Doppler -Monitor on telemetry  Anemia likely chronic-hemoglobin 9.1, hemoglobin on file from 5 years ago 11.4.  Denies melena, no hematochezia or hematemesis.  She is on 81 mg aspirin daily.  Doubts this is the etiology of her pre-syncopal episodes. -Continue aspirin for now -CBC in the morning -Anemia panel in the morning.  Dyslipidemia -Resume statin   DVT prophylaxis: Lovenox Code Status: Full code Family Communication: Son at bedside. Disposition Plan: 1 to 2 days, pending syncope work-up Consults called: None. Admission status: Observation, telemetry    Bethena Roys MD [facility]  02/07/2020, 5:57 PM

## 2020-02-08 ENCOUNTER — Observation Stay (HOSPITAL_COMMUNITY): Payer: Medicare Other

## 2020-02-08 DIAGNOSIS — E785 Hyperlipidemia, unspecified: Secondary | ICD-10-CM | POA: Diagnosis present

## 2020-02-08 DIAGNOSIS — D649 Anemia, unspecified: Secondary | ICD-10-CM | POA: Diagnosis present

## 2020-02-08 DIAGNOSIS — I10 Essential (primary) hypertension: Secondary | ICD-10-CM | POA: Diagnosis present

## 2020-02-08 DIAGNOSIS — Z7982 Long term (current) use of aspirin: Secondary | ICD-10-CM | POA: Diagnosis not present

## 2020-02-08 DIAGNOSIS — I6523 Occlusion and stenosis of bilateral carotid arteries: Secondary | ICD-10-CM | POA: Diagnosis not present

## 2020-02-08 DIAGNOSIS — R55 Syncope and collapse: Secondary | ICD-10-CM | POA: Diagnosis present

## 2020-02-08 DIAGNOSIS — Z79899 Other long term (current) drug therapy: Secondary | ICD-10-CM | POA: Diagnosis not present

## 2020-02-08 DIAGNOSIS — Z888 Allergy status to other drugs, medicaments and biological substances status: Secondary | ICD-10-CM | POA: Diagnosis not present

## 2020-02-08 DIAGNOSIS — Z96642 Presence of left artificial hip joint: Secondary | ICD-10-CM | POA: Diagnosis present

## 2020-02-08 DIAGNOSIS — Z8249 Family history of ischemic heart disease and other diseases of the circulatory system: Secondary | ICD-10-CM | POA: Diagnosis not present

## 2020-02-08 DIAGNOSIS — Z800 Family history of malignant neoplasm of digestive organs: Secondary | ICD-10-CM | POA: Diagnosis not present

## 2020-02-08 DIAGNOSIS — Z833 Family history of diabetes mellitus: Secondary | ICD-10-CM | POA: Diagnosis not present

## 2020-02-08 DIAGNOSIS — Z20822 Contact with and (suspected) exposure to covid-19: Secondary | ICD-10-CM | POA: Diagnosis present

## 2020-02-08 DIAGNOSIS — Z8261 Family history of arthritis: Secondary | ICD-10-CM | POA: Diagnosis not present

## 2020-02-08 DIAGNOSIS — Z886 Allergy status to analgesic agent status: Secondary | ICD-10-CM | POA: Diagnosis not present

## 2020-02-08 DIAGNOSIS — N390 Urinary tract infection, site not specified: Secondary | ICD-10-CM | POA: Diagnosis present

## 2020-02-08 DIAGNOSIS — Z7983 Long term (current) use of bisphosphonates: Secondary | ICD-10-CM | POA: Diagnosis not present

## 2020-02-08 LAB — CBC
HCT: 29.9 % — ABNORMAL LOW (ref 36.0–46.0)
Hemoglobin: 9.1 g/dL — ABNORMAL LOW (ref 12.0–15.0)
MCH: 23.1 pg — ABNORMAL LOW (ref 26.0–34.0)
MCHC: 30.4 g/dL (ref 30.0–36.0)
MCV: 75.9 fL — ABNORMAL LOW (ref 80.0–100.0)
Platelets: 261 10*3/uL (ref 150–400)
RBC: 3.94 MIL/uL (ref 3.87–5.11)
RDW: 17.1 % — ABNORMAL HIGH (ref 11.5–15.5)
WBC: 4.9 10*3/uL (ref 4.0–10.5)
nRBC: 0 % (ref 0.0–0.2)

## 2020-02-08 LAB — ECHOCARDIOGRAM COMPLETE
AR max vel: 2.42 cm2
AV Area VTI: 2.42 cm2
AV Area mean vel: 2.35 cm2
AV Mean grad: 2.5 mmHg
AV Peak grad: 4.9 mmHg
Ao pk vel: 1.11 m/s
Area-P 1/2: 2.3 cm2
Height: 63 in
S' Lateral: 2.2 cm
Weight: 2505.6 oz

## 2020-02-08 LAB — IRON AND TIBC
Iron: 23 ug/dL — ABNORMAL LOW (ref 28–170)
Saturation Ratios: 6 % — ABNORMAL LOW (ref 10.4–31.8)
TIBC: 379 ug/dL (ref 250–450)
UIBC: 356 ug/dL

## 2020-02-08 LAB — RETICULOCYTES
Immature Retic Fract: 13.8 % (ref 2.3–15.9)
RBC.: 3.93 MIL/uL (ref 3.87–5.11)
Retic Count, Absolute: 38.5 10*3/uL (ref 19.0–186.0)
Retic Ct Pct: 1 % (ref 0.4–3.1)

## 2020-02-08 LAB — VITAMIN B12: Vitamin B-12: 64 pg/mL — ABNORMAL LOW (ref 180–914)

## 2020-02-08 LAB — FOLATE: Folate: 19.1 ng/mL (ref >5.9)

## 2020-02-08 LAB — FERRITIN: Ferritin: 7 ng/mL — ABNORMAL LOW (ref 11–307)

## 2020-02-08 MED ORDER — LOSARTAN POTASSIUM 50 MG PO TABS
25.0000 mg | ORAL_TABLET | Freq: Every day | ORAL | Status: DC
  Administered 2020-02-08 – 2020-02-09 (×2): 25 mg via ORAL
  Filled 2020-02-08 (×2): qty 1

## 2020-02-08 MED ORDER — SODIUM CHLORIDE 0.9 % IV SOLN
1.0000 g | INTRAVENOUS | Status: DC
  Administered 2020-02-08 – 2020-02-09 (×2): 1 g via INTRAVENOUS
  Filled 2020-02-08 (×2): qty 10

## 2020-02-08 NOTE — Progress Notes (Signed)
*  PRELIMINARY RESULTS* Echocardiogram 2D Echocardiogram has been performed.  Carol Gutierrez 02/08/2020, 10:20 AM

## 2020-02-08 NOTE — Progress Notes (Addendum)
PROGRESS NOTE    Carol Gutierrez  SWH:[id number] DOB: July 26, 1935 DOA: 02/07/2020 PCP: Asencion Noble, MD   Brief Narrative: 84 years old female with medical history significant for hypertension presents in the emergency department with presyncopal episodes.  She reports all her symptoms started after she went out and eat store-bought " pig in a blanket" hot dog and then started becoming dizzy. Patient is admitted for presyncope/syncope work-up.  Carotid duplex, Echocardiogram is completed and been unremarkable.   Assessment & Plan:   Principal Problem:   Pre-syncope Active Problems:   HTN (hypertension)   Near syncope   Presyncope- patient presents with fourth or fifth episode in the past 2 to 3 years.  Not previously evaluated.  No cardiac history.  Electrolytes normal.  Sinus rhythm on telemetry. Episodes occur after eating particular foods.  Troponin 3 x 2, portable chest x-ray without acute abnormality.  EKG without significant abnormalities no old EKG to compare. -Obtain orthostatic vitals -Echocardiogram: Left ventricular ejection fraction 60 to 65%, no regional wall motion abnormality. -Carotid duplex shows no hemodynamically significant stenosis. -Monitor on telemetry.  Anemia likely chronic-hemoglobin 9.1, hemoglobin on file from 5 years ago 11.4.  Denies melena, no hematochezia or hematemesis.   She is on 81 mg aspirin daily.  Doubts this is the etiology of her pre-syncopal episodes. -Continue aspirin for now. -CBC in the morning -Anemia panel in the morning.  Dyslipidemia -Resume statin  UTI:  UA consistent with UTI,  Reports urinary frequency Continue ceftriaxone, awaiting urine cultures    DVT prophylaxis:  Lovenox Code Status: Full Family Communication: Husband at bed side. Disposition Plan:   Status is: Inpatient  Remains inpatient appropriate because:Inpatient level of care appropriate due to severity of illness   Dispo: The patient is from: Home               Anticipated d/c is to: Home              Anticipated d/c date is: 1 day              Patient currently is not medically stable to d/c.   Consultants:   None.  Procedures:  Antimicrobials:  Anti-infectives (From admission, onward)   Start     Dose/Rate Route Frequency Ordered Stop   02/08/20 0900  cefTRIAXone (ROCEPHIN) 1 g in sodium chloride 0.9 % 100 mL IVPB        1 g 200 mL/hr over 30 Minutes Intravenous Every 24 hours 02/08/20 0858 02/11/20 0859      Subjective: Patient was seen and examined at bedside.  Overnight events noted.  Patient reports feeling much better.  Denies any further dizziness.  Objective: Vitals:   02/08/20 1130 02/08/20 1200 02/08/20 1230 02/08/20 1300  BP:    109/72  Pulse: (!) 54 (!) 53 68 62  Resp:    17  Temp:      TempSrc:      SpO2: 97% 95% 95% 100%  Weight:      Height:        Intake/Output Summary (Last 24 hours) at 02/08/2020 1508 Last data filed at 02/08/2020 0945 Gross per 24 hour  Intake 100 ml  Output --  Net 100 ml   Filed Weights   02/07/20 1414  Weight: 71 kg    Examination:  General exam: Appears calm and comfortable  Respiratory system: Clear to auscultation. Respiratory effort normal. Cardiovascular system: S1 & S2 heard, RRR. No JVD, murmurs, rubs, gallops or clicks.  No pedal edema. Gastrointestinal system: Abdomen is nondistended, soft and nontender. No organomegaly or masses felt. Normal bowel sounds heard. Central nervous system: Alert and oriented. No focal neurological deficits. Extremities:  No edema, no cyanosis, no clubbing. Skin: No rashes, lesions or ulcers Psychiatry: Judgement and insight appear normal. Mood & affect appropriate.     Data Reviewed: I have personally reviewed following labs and imaging studies  CBC: Recent Labs  Lab 02/07/20 1506 02/08/20 0454  WBC 5.4 4.9  NEUTROABS 3.2  --   HGB 9.1* 9.1*  HCT 30.3* 29.9*  MCV 75.2* 75.9*  PLT 272 893   Basic Metabolic  Panel: Recent Labs  Lab 02/07/20 1506  NA 137  K 3.7  CL 102  CO2 25  GLUCOSE 119*  BUN 13  CREATININE 0.98  CALCIUM 9.1   GFR: Estimated Creatinine Clearance: 41.8 mL/min (by C-G formula based on SCr of 0.98 mg/dL). Liver Function Tests: Recent Labs  Lab 02/07/20 1506  AST 19  ALT 16  ALKPHOS 81  BILITOT 0.6  PROT 6.9  ALBUMIN 3.6   No results for input(s): LIPASE, AMYLASE in the last 168 hours. No results for input(s): AMMONIA in the last 168 hours. Coagulation Profile: No results for input(s): INR, PROTIME in the last 168 hours. Cardiac Enzymes: No results for input(s): CKTOTAL, CKMB, CKMBINDEX, TROPONINI in the last 168 hours. BNP (last 3 results) No results for input(s): PROBNP in the last 8760 hours. HbA1C: No results for input(s): HGBA1C in the last 72 hours. CBG: No results for input(s): GLUCAP in the last 168 hours. Lipid Profile: No results for input(s): CHOL, HDL, LDLCALC, TRIG, CHOLHDL, LDLDIRECT in the last 72 hours. Thyroid Function Tests: No results for input(s): TSH, T4TOTAL, FREET4, T3FREE, THYROIDAB in the last 72 hours. Anemia Panel: Recent Labs    02/08/20 0454  VITAMINB12 64*  FOLATE 19.1  FERRITIN 7*  TIBC 379  IRON 23*  RETICCTPCT 1.0   Sepsis Labs: No results for input(s): PROCALCITON, LATICACIDVEN in the last 168 hours.  Recent Results (from the past 240 hour(s))  Respiratory Panel by RT PCR (Flu A&B, Covid) - Nasopharyngeal Swab     Status: None   Collection Time: 02/07/20  4:28 PM   Specimen: Nasopharyngeal Swab  Result Value Ref Range Status   SARS Coronavirus 2 by RT PCR NEGATIVE NEGATIVE Final    Comment: (NOTE) SARS-CoV-2 target nucleic acids are NOT DETECTED.  The SARS-CoV-2 RNA is generally detectable in upper respiratoy specimens during the acute phase of infection. The lowest concentration of SARS-CoV-2 viral copies this assay can detect is 131 copies/mL. A negative result does not preclude SARS-Cov-2 infection  and should not be used as the sole basis for treatment or other patient management decisions. A negative result may occur with  improper specimen collection/handling, submission of specimen other than nasopharyngeal swab, presence of viral mutation(s) within the areas targeted by this assay, and inadequate number of viral copies (<131 copies/mL). A negative result must be combined with clinical observations, patient history, and epidemiological information. The expected result is Negative.  Fact Sheet for Patients:  PinkCheek.be  Fact Sheet for Healthcare Providers:  GravelBags.it  This test is no t yet approved or cleared by the [place] FDA and  has been authorized for detection and/or diagnosis of SARS-CoV-2 by FDA under an Emergency Use Authorization (EUA). This EUA will remain  in effect (meaning this test can be used) for the duration of the COVID-19 declaration under Section 564(b)(1) of  the Act, 21 U.S.C. section 360bbb-3(b)(1), unless the authorization is terminated or revoked sooner.     Influenza A by PCR NEGATIVE NEGATIVE Final   Influenza B by PCR NEGATIVE NEGATIVE Final    Comment: (NOTE) The Xpert Xpress SARS-CoV-2/FLU/RSV assay is intended as an aid in  the diagnosis of influenza from Nasopharyngeal swab specimens and  should not be used as a sole basis for treatment. Nasal washings and  aspirates are unacceptable for Xpert Xpress SARS-CoV-2/FLU/RSV  testing.  Fact Sheet for Patients: PinkCheek.be  Fact Sheet for Healthcare Providers: GravelBags.it  This test is not yet approved or cleared by the [place] FDA and  has been authorized for detection and/or diagnosis of SARS-CoV-2 by  FDA under an Emergency Use Authorization (EUA). This EUA will remain  in effect (meaning this test can be used) for the duration of the  Covid-19 declaration  under Section 564(b)(1) of the Act, 21  U.S.C. section 360bbb-3(b)(1), unless the authorization is  terminated or revoked. Performed at [facility], [address], [place], [place] [address]     Radiology Studies: US Carotid Bilateral  Result Date: 02/08/2020 CLINICAL DATA:  84 year old female with a history of presyncope EXAM: BILATERAL CAROTID DUPLEX ULTRASOUND TECHNIQUE: Pearline Cables scale imaging, color Doppler and duplex ultrasound were performed of bilateral carotid and vertebral arteries in the neck. COMPARISON:  None. FINDINGS: Criteria: Quantification of carotid stenosis is based on velocity parameters that correlate the residual internal carotid diameter with NASCET-based stenosis levels, using the diameter of the distal internal carotid lumen as the denominator for stenosis measurement. The following velocity measurements were obtained: RIGHT ICA:  Systolic 93 cm/sec, Diastolic 16 cm/sec CCA:  70 cm/sec SYSTOLIC ICA/CCA RATIO:  1.3 ECA:  79 cm/sec LEFT ICA:  Systolic 84 cm/sec, Diastolic 26 cm/sec CCA:  72 cm/sec SYSTOLIC ICA/CCA RATIO:  1.2 ECA:  56 cm/sec Right Brachial SBP: Not acquired Left Brachial SBP: Not acquired RIGHT CAROTID ARTERY: No significant calcified disease of the right common carotid artery. Intermediate waveform maintained. Heterogeneous plaque without significant calcifications at the right carotid bifurcation. Low resistance waveform of the right ICA. No significant tortuosity. RIGHT VERTEBRAL ARTERY: Antegrade flow with low resistance waveform. LEFT CAROTID ARTERY: No significant calcified disease of the left common carotid artery. Intermediate waveform maintained. Heterogeneous plaque at the left carotid bifurcation without significant calcifications. Low resistance waveform of the left ICA. LEFT VERTEBRAL ARTERY:  Antegrade flow with low resistance waveform. IMPRESSION: Color duplex indicates minimal heterogeneous plaque, with no hemodynamically significant stenosis by duplex  criteria in the extracranial cerebrovascular circulation. Signed, Dulcy Fanny. Dellia Nims, RPVI [facility] Radiology Electronically Signed   By: Corrie Mckusick D.O.   On: 02/08/2020 10:49   DG Chest Port 1 View  Result Date: 02/07/2020 CLINICAL DATA:  Near syncopal episode. Nausea, vomiting, shaking and sweats. EXAM: PORTABLE CHEST 1 VIEW COMPARISON:  07/05/2019 FINDINGS: The cardiac silhouette, mediastinal and hilar contours are within normal limits for age and stable. Moderate tortuosity and calcification of the thoracic aorta. No acute pulmonary findings. No pulmonary lesions or pleural effusions. Stable mild eventration of the right hemidiaphragm. The bony structures are intact. Stable advanced degenerative changes involving the left shoulder. IMPRESSION: No acute cardiopulmonary findings. Electronically Signed   By: Marijo Sanes M.D.   On: 02/07/2020 15:07   ECHOCARDIOGRAM COMPLETE  Result Date: 02/08/2020    ECHOCARDIOGRAM REPORT   Patient Name:   Carol Gutierrez Date of Exam: 02/08/2020 Medical Rec #:  [id number]         Height:       63.0 in Accession #:    [id number]        Weight:       156.6 lb Date of Birth:  1935-12-16          BSA:          1.743 m Patient Age:    34 years          BP:           119/68 mmHg Patient Gender: F                 HR:           54 bpm. Exam Location:  [facility] Procedure: 2D Echo Indications:    Syncope 780.2 / R55  History:        Patient has no prior history of Echocardiogram examinations.                 Risk Factors:Hypertension and Non-Smoker. Presyncope.  Sonographer:    Leavy Cella RDCS (AE) Referring Phys: [place]  1. Left ventricular ejection fraction, by estimation, is 60 to 65%. The left ventricle has normal function. The left ventricle has no regional wall motion abnormalities. There is mild left ventricular hypertrophy. Left ventricular diastolic parameters are consistent with  Grade I diastolic dysfunction (impaired relaxation).  2. Right ventricular systolic function is normal. The right ventricular size is normal.  3. Left atrial size was mildly dilated.  4. The mitral valve is normal in structure. No evidence of mitral valve regurgitation. No evidence of mitral stenosis.  5. The aortic valve is tricuspid. Aortic valve regurgitation is not visualized. No aortic stenosis is present.  6. The inferior vena cava is normal in size with greater than 50% respiratory variability, suggesting right atrial pressure of 3 mmHg. FINDINGS  Left Ventricle: Left ventricular ejection fraction, by estimation, is 60 to 65%. The left ventricle has normal function. The left ventricle has no regional wall motion abnormalities. The left ventricular internal cavity size was normal in size. There is  mild left ventricular hypertrophy. Left ventricular diastolic parameters are consistent with Grade I diastolic dysfunction (impaired relaxation). Normal left ventricular filling pressure. Right Ventricle: The right ventricular size is normal. No increase in right ventricular wall thickness. Right ventricular systolic function is normal. Left Atrium: Left atrial size was mildly dilated. Right Atrium: Right atrial size was normal in size. Pericardium: There is no evidence of pericardial effusion. Mitral Valve: The mitral valve is normal in structure. No evidence of mitral valve regurgitation. No evidence of mitral valve stenosis. Tricuspid Valve: The tricuspid valve is normal in structure. Tricuspid valve regurgitation is not demonstrated. No evidence of tricuspid stenosis. Aortic Valve: The aortic valve is tricuspid. Aortic valve regurgitation is not visualized. No aortic stenosis is present. Aortic valve mean gradient measures 2.5 mmHg. Aortic valve peak gradient measures 4.9 mmHg. Aortic valve area, by VTI measures 2.42 cm. Pulmonic Valve: The pulmonic valve was not well visualized. Pulmonic valve regurgitation is  not visualized. No evidence of pulmonic stenosis. Aorta: The aortic root is normal in size and structure. Pulmonary Artery: Indeterminant PASP, inadequate TR jet. Venous: The inferior vena cava is normal in size with greater than 50% respiratory variability, suggesting right atrial pressure of 3 mmHg. IAS/Shunts: No atrial level shunt detected by color flow Doppler.  LEFT VENTRICLE PLAX 2D LVIDd:  3.40 cm  Diastology LVIDs:         2.20 cm  LV e' medial:    7.51 cm/s LV PW:         1.37 cm  LV E/e' medial:  11.4 LV IVS:        1.24 cm  LV e' lateral:   8.05 cm/s LVOT diam:     1.80 cm  LV E/e' lateral: 10.7 LV SV:         64 LV SV Index:   36 LVOT Area:     2.54 cm  RIGHT VENTRICLE RV S prime:     10.40 cm/s TAPSE (M-mode): 2.7 cm LEFT ATRIUM             Index       RIGHT ATRIUM           Index LA diam:        3.50 cm 2.01 cm/m  RA Area:     15.50 cm LA Vol (A2C):   42.7 ml 24.50 ml/m RA Volume:   39.90 ml  22.90 ml/m LA Vol (A4C):   52.3 ml 30.01 ml/m LA Biplane Vol: 47.5 ml 27.26 ml/m  AORTIC VALVE AV Area (Vmax):    2.42 cm AV Area (Vmean):   2.35 cm AV Area (VTI):     2.42 cm AV Vmax:           111.20 cm/s AV Vmean:          72.475 cm/s AV VTI:            0.263 m AV Peak Grad:      4.9 mmHg AV Mean Grad:      2.5 mmHg LVOT Vmax:         105.57 cm/s LVOT Vmean:        66.886 cm/s LVOT VTI:          0.250 m LVOT/AV VTI ratio: 0.95  AORTA Ao Root diam: 3.10 cm MITRAL VALVE MV Area (PHT): 2.30 cm    SHUNTS MV Decel Time: 330 msec    Systemic VTI:  0.25 m MV E velocity: 85.90 cm/s  Systemic Diam: 1.80 cm MV A velocity: 93.00 cm/s MV E/A ratio:  0.92 Carlyle Dolly MD Electronically signed by Carlyle Dolly MD Signature Date/Time: 02/08/2020/10:59:32 AM    Final     Scheduled Meds: . aspirin EC  81 mg Oral QHS  . atorvastatin  40 mg Oral QPM  . enoxaparin (LOVENOX) injection  40 mg Subcutaneous Q24H  . lisinopril  20 mg Oral QPM   Continuous Infusions: . cefTRIAXone (ROCEPHIN)  IV Stopped  (02/08/20 0945)     LOS: 0 days    Time spent: 25 mins.    Shawna Clamp, MD [facility]   If 7PM-7AM, please contact night-coverage

## 2020-02-09 DIAGNOSIS — R55 Syncope and collapse: Secondary | ICD-10-CM | POA: Diagnosis not present

## 2020-02-09 LAB — CBC
HCT: 30.2 % — ABNORMAL LOW (ref 36.0–46.0)
Hemoglobin: 9.3 g/dL — ABNORMAL LOW (ref 12.0–15.0)
MCH: 23.4 pg — ABNORMAL LOW (ref 26.0–34.0)
MCHC: 30.8 g/dL (ref 30.0–36.0)
MCV: 76.1 fL — ABNORMAL LOW (ref 80.0–100.0)
Platelets: 244 10*3/uL (ref 150–400)
RBC: 3.97 MIL/uL (ref 3.87–5.11)
RDW: 17.1 % — ABNORMAL HIGH (ref 11.5–15.5)
WBC: 5.7 10*3/uL (ref 4.0–10.5)
nRBC: 0 % (ref 0.0–0.2)

## 2020-02-09 LAB — BASIC METABOLIC PANEL
Anion gap: 7 (ref 5–15)
BUN: 15 mg/dL (ref 8–23)
CO2: 28 mmol/L (ref 22–32)
Calcium: 9 mg/dL (ref 8.9–10.3)
Chloride: 103 mmol/L (ref 98–111)
Creatinine, Ser: 0.98 mg/dL (ref 0.44–1.00)
GFR calc non Af Amer: 54 mL/min — ABNORMAL LOW (ref >60)
Glucose, Bld: 87 mg/dL (ref 70–99)
Potassium: 4.9 mmol/L (ref 3.5–5.1)
Sodium: 138 mmol/L (ref 135–145)

## 2020-02-09 LAB — MAGNESIUM: Magnesium: 2.1 mg/dL (ref 1.7–2.4)

## 2020-02-09 LAB — PHOSPHORUS: Phosphorus: 4 mg/dL (ref 2.5–4.6)

## 2020-02-09 MED ORDER — FERROUS SULFATE 325 (65 FE) MG PO TABS
325.0000 mg | ORAL_TABLET | Freq: Two times a day (BID) | ORAL | Status: DC
  Administered 2020-02-09: 325 mg via ORAL
  Filled 2020-02-09: qty 1

## 2020-02-09 MED ORDER — CEPHALEXIN 500 MG PO CAPS: 500.0000 mg | ORAL_CAPSULE | Freq: Two times a day (BID) | ORAL | 0 refills | Status: AC

## 2020-02-09 MED ORDER — VITAMIN B-12 1000 MCG PO TABS
1000.0000 ug | ORAL_TABLET | Freq: Every day | ORAL | Status: DC
  Administered 2020-02-09: 1000 ug via ORAL
  Filled 2020-02-09: qty 1

## 2020-02-09 MED ORDER — CYANOCOBALAMIN 1000 MCG PO TABS: 1000.0000 ug | ORAL_TABLET | Freq: Every day | ORAL | 3 refills | Status: AC

## 2020-02-09 MED ORDER — LOSARTAN POTASSIUM 25 MG PO TABS
25.0000 mg | ORAL_TABLET | Freq: Every day | ORAL | 2 refills | Status: DC
Start: 2020-02-10 — End: 2020-02-17

## 2020-02-09 MED ORDER — FERROUS SULFATE 325 (65 FE) MG PO TABS: 325.0000 mg | ORAL_TABLET | Freq: Two times a day (BID) | ORAL | 3 refills | Status: AC

## 2020-02-09 NOTE — TOC Transition Note (Signed)
Transition of Care [facility]) - CM/SW Discharge Note  Patient Details  Name: Carol Gutierrez MRN: [id number] Date of Birth: Feb 09, 1936  [facility] CM/SW Contact:  Sherie Don, LCSW Phone Number: 02/09/2020, 10:53 AM  Clinical Narrative: Patient will need follow up appointment with PCP. CSW spoke with patient and patient reported she scheduled her appointment for 02/16/20 at 10:30am. Appointment added to AVS. TOC signing off.  Final next level of care: Home/Self Care Barriers to Discharge: Barriers Resolved  Patient Goals and CMS Choice Patient states their goals for this hospitalization and ongoing recovery are:: Discharge home  Discharge Plan and Services        DME Arranged: N/A DME Agency: NA HH Arranged: NA [place] Agency: NA  Readmission Risk Interventions No flowsheet data found.

## 2020-02-09 NOTE — Progress Notes (Signed)
OT Screen Note  Patient Details Name: Carol Gutierrez MRN: [id number] DOB: [birth year]   Cancelled Treatment:    Reason Eval/Treat Not Completed: OT screened, no needs identified, will sign off.  Pt reports that she just finished with PT evaluation prior therapy arrival. Patient reports that she is currently functioning at her baseline. She has a history of limited LUE ROM and strength from a old proximal humerus fracture. Her balance is slightly impaired which she reports is also her baseline. Patient is able to report strategies and techniques at home she is utilizing to decrease the chance of falls. Pt reports no concerns returning home at this time. No follow up OT services needed at discharge.   Ailene Ravel, OTR/L,CBIS  [phone]  02/09/2020, 9:11 AM

## 2020-02-09 NOTE — Progress Notes (Signed)
Discharge instructions reviewed with patient. Patient verbalized understanding of instruction, discharged home with family in stable condition.   

## 2020-02-09 NOTE — Evaluation (Signed)
Physical Therapy Evaluation Patient Details Name: Carol Gutierrez MRN: [id number] DOB: [birth year] Today's Date: 02/09/2020   History of Present Illness  Carol Gutierrez is a 84 y.o. female with medical history significant for hypertension.  Patient presented to the ED with complaints of almost passing out today.  Patient's woke up in the morning feeling okay, she went out, ate a few things including store-bought "pig in a blanket", and then she became dizzy, everything turned black, she felt she was going to pass out, but did not actually pass out.  She reports vomiting, diarrhea after feeling better.  She reports during episode she was shaking all over uncontrollably, but she was conscious. Patient has had similar episodes in the recent past.  She reports over the past 2 to 3 years she has had about 4 episodes of this.  Last episode was about 6 months ago.  Episodes usually occur after she has eaten something store-bought, likely with preservatives, and then about 40 to 45 minutes later she has the same episode of almost passing out, and after vomiting and an episode of diarrhea, she feels back to her normal self.  She feels these episodes are related to food she is eating, that she may be reacting to preservatives in the food.  Except for today she has never had shaking of her extremities.  She has never had these episodes after eating her own home-cooked foods.  These episodes of dizziness usually occur when she is standing.No history of cardiac disease.  No history of seizures. Patient was a Marine scientist.  Patient son is at bedside.    Clinical Impression  Patient functioning at baseline for functional mobility and gait.  Patient ambulates grossly WFL other than decreased step/stride length LLE due to old hip and ankle surgeries, demonstrates good return for ambulation on level, inclined and declined surfaces without loss of balance.  Plan:  Patient discharged from physical therapy to care of nursing for  ambulation daily as tolerated for length of stay.     Follow Up Recommendations No PT follow up    Equipment Recommendations  None recommended by PT    Recommendations for Other Services       Precautions / Restrictions Precautions Precautions: None Restrictions Weight Bearing Restrictions: No      Mobility  Bed Mobility Overal bed mobility: Modified Independent                Transfers Overall transfer level: Modified independent                  Ambulation/Gait Ambulation/Gait assistance: Modified independent (Device/Increase time) Gait Distance (Feet): 200 Feet Assistive device: None Gait Pattern/deviations: WFL(Within Functional Limits);Decreased step length - left;Decreased stride length Gait velocity: slightly decreased   General Gait Details: grossly WFL other than decreased step/stride length LLE due to old hip and ankle surgeries, demonstrates good return for ambulation on level, inclined and declined surfaces without loss of balance  Stairs            Wheelchair Mobility    Modified Rankin (Stroke Patients Only)       Balance Overall balance assessment: No apparent balance deficits (not formally assessed)                                           Pertinent Vitals/Pain Pain Assessment: No/denies pain    Home Living Family/patient  expects to be discharged to:: Private residence Living Arrangements: Alone Available Help at Discharge: Family;Available PRN/intermittently Type of Home: House Home Access: Ramped entrance     Home Layout: Two level;Able to live on main level with bedroom/bathroom Home Equipment: Gilford Rile - 2 wheels;Cane - single point;Wheelchair - manual      Prior Function Level of Independence: Independent         Comments: Hydrographic surveyor, drives     Journalist, newspaper        Extremity/Trunk Assessment   Upper Extremity Assessment Upper Extremity Assessment: Defer to OT  evaluation    Lower Extremity Assessment Lower Extremity Assessment: Overall WFL for tasks assessed    Cervical / Trunk Assessment Cervical / Trunk Assessment: Normal  Communication   Communication: No difficulties  Cognition Arousal/Alertness: Awake/alert Behavior During Therapy: WFL for tasks assessed/performed Overall Cognitive Status: Within Functional Limits for tasks assessed                                        General Comments      Exercises     Assessment/Plan    PT Assessment Patent does not need any further PT services  PT Problem List         PT Treatment Interventions      PT Goals (Current goals can be found in the Care Plan section)  Acute Rehab PT Goals Patient Stated Goal: return home with family to assist PT Goal Formulation: With patient Time For Goal Achievement: 02/09/20 Potential to Achieve Goals: Good    Frequency     Barriers to discharge        Co-evaluation               AM-PAC PT "6 Clicks" Mobility  Outcome Measure Help needed turning from your back to your side while in a flat bed without using bedrails?: None Help needed moving from lying on your back to sitting on the side of a flat bed without using bedrails?: None Help needed moving to and from a bed to a chair (including a wheelchair)?: None Help needed standing up from a chair using your arms (e.g., wheelchair or bedside chair)?: None Help needed to walk in hospital room?: None Help needed climbing 3-5 steps with a railing? : None 6 Click Score: 24    End of Session   Activity Tolerance: Patient tolerated treatment well Patient left: in bed;with call bell/phone within reach;with family/visitor present Nurse Communication: Mobility status PT Visit Diagnosis: Unsteadiness on feet (R26.81);Other abnormalities of gait and mobility (R26.89);Muscle weakness (generalized) (M62.81)    Time: 0802-0819 PT Time Calculation (min) (ACUTE ONLY): 17  min   Charges:   PT Evaluation $PT Eval Low Complexity: 1 Low PT Treatments $Gait Training: 8-22 mins        10:14 AM, 02/09/20 Lonell Grandchild, MPT Physical Therapist with [facility] [phone] office [phone] mobile phone

## 2020-02-09 NOTE — Discharge Summary (Addendum)
Physician Discharge Summary  Carol Gutierrez ASN:[id number] DOB: 1936/02/05 DOA: 02/07/2020  PCP: Asencion Noble, MD  Admit date: 02/07/2020.  Discharge date: 02/09/2020.  Admitted From:  Home. Disposition:  Home.  Recommendations for Outpatient Follow-up:  1. Follow up with PCP in 1-2 weeks. 2. Please obtain BMP/CBC in one week. 3. She is found to have Vitamin B 12 and Iron deficiency.  4. Advised to take vitamin B12 and Iron supplements. 5. Advised to take keflex twice daly for 7 days for UTI. 6. Lisinopril was discontinued because of cough and was transitioned to losartan.  Home Health: None. Equipment/Devices: None.  Discharge Condition: Stable. CODE STATUS:Full code Diet recommendation: Heart Healthy   Brief Summary / Hospital course: This 84 years old female with medical history significant for hypertension presents in the emergency department with presyncopal episodes.  She reports all her symptoms started after she went out and ate store-bought " pig in a blanket" hot dog and then started becoming dizzy.  She reports similar episodes in the past and all happened after she ate particular kind of food. Patient was admitted for presyncope/syncope work-up.  Carotid duplex, Echocardiogram was completed and been unremarkable.  She is found to have vitamin B12 and iron deficiency.  She was started on supplements.  She is also found to have a UTI  and started on ceftriaxone.  She reports feeling better and wants to be discharged.  Patient is discharged on Keflex twice a day for 7 more days for UTI.  She also reports cough with lisinopril which was transitioned to losartan,  cough has improved.  We will request primary care physician to titrate her blood pressure medications as required.   She was managed for below problems.  Discharge Diagnoses:  Principal Problem:   Pre-syncope Active Problems:   HTN (hypertension)   Near syncope   Presyncope- patient presents with fourth or  fifth episode in the past 2 to 3 years. Notpreviously evaluated. No cardiac history. Electrolytes normal. Sinus rhythm on telemetry. Episodes occurafter eating particular foods. Troponin x 3  negative, portable chest x-ray without acute abnormality. EKG without significant abnormalities,  no old EKG to compare. -Obtain orthostatic vitals : WNL -Echocardiogram: Left ventricular ejection fraction 60 to 65%, no regional wall motion abnormality. -Carotid duplex shows no hemodynamically significant stenosis. -Monitor on telemetry.  Anemia likely chronic-hemoglobin 9.1, hemoglobin on file from 5 years ago 11.4. Denies melena, no hematochezia or hematemesis.  She is on 81 mg aspirin daily.Doubts this is the etiology of her pre-syncopal episodes. -Continue aspirin for now. -CBC in the morning -Anemia panel in the morning.  Vitamin B12/ Iron deficiency: Iron and B12 supplementation  Dyslipidemia -Resume statin.  UTI:  UA consistent with UTI,  Reports urinary frequency Continue ceftriaxone, urine culture not sent . DC on keflex.   Discharge Instructions  Discharge Instructions    Call MD for:  difficulty breathing, headache or visual disturbances   Complete by: As directed    Call MD for:  persistant dizziness or light-headedness   Complete by: As directed    Call MD for:  persistant nausea and vomiting   Complete by: As directed    Diet - low sodium heart healthy   Complete by: As directed    Diet Carb Modified   Complete by: As directed    Discharge instructions   Complete by: As directed    Advised to follow up PCP in one week. She is found to have Vitamin B 12 and Iron  deficiency.  Advised to take vitamin B12 and Iron supplements. Advised to take keflex twice daly for 7 days.   Increase activity slowly   Complete by: As directed      Allergies as of 02/09/2020      Reactions   Lisinopril Cough   Hydrocodone-acetaminophen Itching, Rash      Medication List     STOP taking these medications   lisinopril 20 MG tablet Commonly known as: ZESTRIL     TAKE these medications   aspirin EC 81 MG tablet Take 1 tablet (81 mg total) by mouth at bedtime.   atorvastatin 40 MG tablet Commonly known as: LIPITOR Take 40 mg by mouth every evening.   cephALEXin 500 MG capsule Commonly known as: KEFLEX Take 1 capsule (500 mg total) by mouth 2 (two) times daily for 7 days.   cyanocobalamin 1000 MCG tablet Take 1 tablet (1,000 mcg total) by mouth daily. Start taking on: February 10, 2020   ferrous sulfate 325 (65 FE) MG tablet Take 1 tablet (325 mg total) by mouth 2 (two) times daily with a meal.   losartan 25 MG tablet Commonly known as: COZAAR Take 1 tablet (25 mg total) by mouth daily. Start taking on: February 10, 2020       Follow-up Information    Asencion Noble, MD Follow up on 02/16/2020.   Specialty: Internal Medicine Why: Appointment is at 10:30am Contact information: [address] [place] [address] [phone]              Allergies  Allergen Reactions  . Lisinopril Cough  . Hydrocodone-Acetaminophen Itching and Rash    Consultations: None.  Procedures/Studies: US Carotid Bilateral  Result Date: 02/08/2020 CLINICAL DATA:  83 year old female with a history of presyncope EXAM: BILATERAL CAROTID DUPLEX ULTRASOUND TECHNIQUE: Pearline Cables scale imaging, color Doppler and duplex ultrasound were performed of bilateral carotid and vertebral arteries in the neck. COMPARISON:  None. FINDINGS: Criteria: Quantification of carotid stenosis is based on velocity parameters that correlate the residual internal carotid diameter with NASCET-based stenosis levels, using the diameter of the distal internal carotid lumen as the denominator for stenosis measurement. The following velocity measurements were obtained: RIGHT ICA:  Systolic 93 cm/sec, Diastolic 16 cm/sec CCA:  70 cm/sec SYSTOLIC ICA/CCA RATIO:  1.3 ECA:  79 cm/sec LEFT ICA:   Systolic 84 cm/sec, Diastolic 26 cm/sec CCA:  72 cm/sec SYSTOLIC ICA/CCA RATIO:  1.2 ECA:  56 cm/sec Right Brachial SBP: Not acquired Left Brachial SBP: Not acquired RIGHT CAROTID ARTERY: No significant calcified disease of the right common carotid artery. Intermediate waveform maintained. Heterogeneous plaque without significant calcifications at the right carotid bifurcation. Low resistance waveform of the right ICA. No significant tortuosity. RIGHT VERTEBRAL ARTERY: Antegrade flow with low resistance waveform. LEFT CAROTID ARTERY: No significant calcified disease of the left common carotid artery. Intermediate waveform maintained. Heterogeneous plaque at the left carotid bifurcation without significant calcifications. Low resistance waveform of the left ICA. LEFT VERTEBRAL ARTERY:  Antegrade flow with low resistance waveform. IMPRESSION: Color duplex indicates minimal heterogeneous plaque, with no hemodynamically significant stenosis by duplex criteria in the extracranial cerebrovascular circulation. Signed, Dulcy Fanny. Dellia Nims, RPVI [facility] Electronically Signed   By: Corrie Mckusick D.O.   On: 02/08/2020 10:49   DG Chest Port 1 View  Result Date: 02/07/2020 CLINICAL DATA:  Near syncopal episode. Nausea, vomiting, shaking and sweats. EXAM: PORTABLE CHEST 1 VIEW COMPARISON:  07/05/2019 FINDINGS: The cardiac silhouette, mediastinal  and hilar contours are within normal limits for age and stable. Moderate tortuosity and calcification of the thoracic aorta. No acute pulmonary findings. No pulmonary lesions or pleural effusions. Stable mild eventration of the right hemidiaphragm. The bony structures are intact. Stable advanced degenerative changes involving the left shoulder. IMPRESSION: No acute cardiopulmonary findings. Electronically Signed   By: Marijo Sanes M.D.   On: 02/07/2020 15:07   ECHOCARDIOGRAM COMPLETE  Result Date: 02/08/2020     ECHOCARDIOGRAM REPORT   Patient Name:   Carol Gutierrez Date of Exam: 02/08/2020 Medical Rec #:  [id number]         Height:       63.0 in Accession #:    [id number]        Weight:       156.6 lb Date of Birth:  [birth year]          BSA:          1.743 m Patient Age:    33 years          BP:           119/68 mmHg Patient Gender: F                 HR:           54 bpm. Exam Location:  [facility] Procedure: 2D Echo Indications:    Syncope 780.2 / R55  History:        Patient has no prior history of Echocardiogram examinations.                 Risk Factors:Hypertension and Non-Smoker. Presyncope.  Sonographer:    Leavy Cella RDCS (AE) Referring Phys: [place]  1. Left ventricular ejection fraction, by estimation, is 60 to 65%. The left ventricle has normal function. The left ventricle has no regional wall motion abnormalities. There is mild left ventricular hypertrophy. Left ventricular diastolic parameters are consistent with Grade I diastolic dysfunction (impaired relaxation).  2. Right ventricular systolic function is normal. The right ventricular size is normal.  3. Left atrial size was mildly dilated.  4. The mitral valve is normal in structure. No evidence of mitral valve regurgitation. No evidence of mitral stenosis.  5. The aortic valve is tricuspid. Aortic valve regurgitation is not visualized. No aortic stenosis is present.  6. The inferior vena cava is normal in size with greater than 50% respiratory variability, suggesting right atrial pressure of 3 mmHg. FINDINGS  Left Ventricle: Left ventricular ejection fraction, by estimation, is 60 to 65%. The left ventricle has normal function. The left ventricle has no regional wall motion abnormalities. The left ventricular internal cavity size was normal in size. There is  mild left ventricular hypertrophy. Left ventricular diastolic parameters are consistent with Grade I diastolic dysfunction (impaired relaxation). Normal left  ventricular filling pressure. Right Ventricle: The right ventricular size is normal. No increase in right ventricular wall thickness. Right ventricular systolic function is normal. Left Atrium: Left atrial size was mildly dilated. Right Atrium: Right atrial size was normal in size. Pericardium: There is no evidence of pericardial effusion. Mitral Valve: The mitral valve is normal in structure. No evidence of mitral valve regurgitation. No evidence of mitral valve stenosis. Tricuspid Valve: The tricuspid valve is normal in structure. Tricuspid valve regurgitation is not demonstrated. No evidence of tricuspid stenosis. Aortic Valve: The aortic valve is tricuspid. Aortic valve regurgitation is not visualized. No aortic stenosis is present. Aortic valve mean gradient measures  2.5 mmHg. Aortic valve peak gradient measures 4.9 mmHg. Aortic valve area, by VTI measures 2.42 cm. Pulmonic Valve: The pulmonic valve was not well visualized. Pulmonic valve regurgitation is not visualized. No evidence of pulmonic stenosis. Aorta: The aortic root is normal in size and structure. Pulmonary Artery: Indeterminant PASP, inadequate TR jet. Venous: The inferior vena cava is normal in size with greater than 50% respiratory variability, suggesting right atrial pressure of 3 mmHg. IAS/Shunts: No atrial level shunt detected by color flow Doppler.  LEFT VENTRICLE PLAX 2D LVIDd:         3.40 cm  Diastology LVIDs:         2.20 cm  LV e' medial:    7.51 cm/s LV PW:         1.37 cm  LV E/e' medial:  11.4 LV IVS:        1.24 cm  LV e' lateral:   8.05 cm/s LVOT diam:     1.80 cm  LV E/e' lateral: 10.7 LV SV:         64 LV SV Index:   36 LVOT Area:     2.54 cm  RIGHT VENTRICLE RV S prime:     10.40 cm/s TAPSE (M-mode): 2.7 cm LEFT ATRIUM             Index       RIGHT ATRIUM           Index LA diam:        3.50 cm 2.01 cm/m  RA Area:     15.50 cm LA Vol (A2C):   42.7 ml 24.50 ml/m RA Volume:   39.90 ml  22.90 ml/m LA Vol (A4C):   52.3 ml 30.01  ml/m LA Biplane Vol: 47.5 ml 27.26 ml/m  AORTIC VALVE AV Area (Vmax):    2.42 cm AV Area (Vmean):   2.35 cm AV Area (VTI):     2.42 cm AV Vmax:           111.20 cm/s AV Vmean:          72.475 cm/s AV VTI:            0.263 m AV Peak Grad:      4.9 mmHg AV Mean Grad:      2.5 mmHg LVOT Vmax:         105.57 cm/s LVOT Vmean:        66.886 cm/s LVOT VTI:          0.250 m LVOT/AV VTI ratio: 0.95  AORTA Ao Root diam: 3.10 cm MITRAL VALVE MV Area (PHT): 2.30 cm    SHUNTS MV Decel Time: 330 msec    Systemic VTI:  0.25 m MV E velocity: 85.90 cm/s  Systemic Diam: 1.80 cm MV A velocity: 93.00 cm/s MV E/A ratio:  0.92 Carlyle Dolly MD Electronically signed by Carlyle Dolly MD Signature Date/Time: 02/08/2020/10:59:32 AM    Final     (Echo, Carotid )   Subjective: Patient was seen and examined at bedside.  Overnight events noted.  Patient reports feeling much better,  ambulated well without any dizziness.  She wants to be discharged home.  Discharge Exam: Vitals:   02/09/20 0026 02/09/20 0437  BP: (!) 101/46 (!) 130/59  Pulse: 62 (!) 59  Resp: 18 18  Temp: 98.4 F (36.9 C) 98.2 F (36.8 C)  SpO2: 96% 97%   Vitals:   02/08/20 1620 02/08/20 2007 02/09/20 0026 02/09/20 0437  BP: 125/63 (!) 129/57 Marland Kitchen)  101/46 (!) 130/59  Pulse: (!) 59 (!) 54 62 (!) 59  Resp: 18 18 18 18   Temp: 98.6 F (37 C) 98.3 F (36.8 C) 98.4 F (36.9 C) 98.2 F (36.8 C)  TempSrc:  Oral Oral Oral  SpO2: 99% 97% 96% 97%  Weight: 71.7 kg     Height: 5\' 3"  (1.6 m)       General: Pt is alert, awake, not in acute distress Cardiovascular: RRR, S1/S2 +, no rubs, no gallops Respiratory: CTA bilaterally, no wheezing, no rhonchi Abdominal: Soft, NT, ND, bowel sounds + Extremities: no edema, no cyanosis    The results of significant diagnostics from this hospitalization (including imaging, microbiology, ancillary and laboratory) are listed below for reference.     Microbiology: Recent Results (from the past 240 hour(s))   Respiratory Panel by RT PCR (Flu A&B, Covid) - Nasopharyngeal Swab     Status: None   Collection Time: 02/07/20  4:28 PM   Specimen: Nasopharyngeal Swab  Result Value Ref Range Status   SARS Coronavirus 2 by RT PCR NEGATIVE NEGATIVE Final    Comment: (NOTE) SARS-CoV-2 target nucleic acids are NOT DETECTED.  The SARS-CoV-2 RNA is generally detectable in upper respiratoy specimens during the acute phase of infection. The lowest concentration of SARS-CoV-2 viral copies this assay can detect is 131 copies/mL. A negative result does not preclude SARS-Cov-2 infection and should not be used as the sole basis for treatment or other patient management decisions. A negative result may occur with  improper specimen collection/handling, submission of specimen other than nasopharyngeal swab, presence of viral mutation(s) within the areas targeted by this assay, and inadequate number of viral copies (<131 copies/mL). A negative result must be combined with clinical observations, patient history, and epidemiological information. The expected result is Negative.  Fact Sheet for Patients:  PinkCheek.be  Fact Sheet for Healthcare Providers:  GravelBags.it  This test is no t yet approved or cleared by the [place] FDA and  has been authorized for detection and/or diagnosis of SARS-CoV-2 by FDA under an Emergency Use Authorization (EUA). This EUA will remain  in effect (meaning this test can be used) for the duration of the COVID-19 declaration under Section 564(b)(1) of the Act, 21 U.S.C. section 360bbb-3(b)(1), unless the authorization is terminated or revoked sooner.     Influenza A by PCR NEGATIVE NEGATIVE Final   Influenza B by PCR NEGATIVE NEGATIVE Final    Comment: (NOTE) The Xpert Xpress SARS-CoV-2/FLU/RSV assay is intended as an aid in  the diagnosis of influenza from Nasopharyngeal swab specimens and  should not be used as  a sole basis for treatment. Nasal washings and  aspirates are unacceptable for Xpert Xpress SARS-CoV-2/FLU/RSV  testing.  Fact Sheet for Patients: PinkCheek.be  Fact Sheet for Healthcare Providers: GravelBags.it  This test is not yet approved or cleared by the [place] FDA and  has been authorized for detection and/or diagnosis of SARS-CoV-2 by  FDA under an Emergency Use Authorization (EUA). This EUA will remain  in effect (meaning this test can be used) for the duration of the  Covid-19 declaration under Section 564(b)(1) of the Act, 21  U.S.C. section 360bbb-3(b)(1), unless the authorization is  terminated or revoked. Performed at [facility], [address] [place], [place] [address]      Labs: BNP (last 3 results) No results for input(s): BNP in the last 8760 hours. Basic Metabolic Panel: Recent Labs  Lab 02/07/20 1506 02/09/20 0549  NA 137 138  K 3.7 4.9  CL 102 103  CO2 25 28  GLUCOSE 119* 87  BUN 13 15  CREATININE 0.98 0.98  CALCIUM 9.1 9.0  MG  --  2.1  PHOS  --  4.0   Liver Function Tests: Recent Labs  Lab 02/07/20 1506  AST 19  ALT 16  ALKPHOS 81  BILITOT 0.6  PROT 6.9  ALBUMIN 3.6   No results for input(s): LIPASE, AMYLASE in the last 168 hours. No results for input(s): AMMONIA in the last 168 hours. CBC: Recent Labs  Lab 02/07/20 1506 02/08/20 0454 02/09/20 0549  WBC 5.4 4.9 5.7  NEUTROABS 3.2  --   --   HGB 9.1* 9.1* 9.3*  HCT 30.3* 29.9* 30.2*  MCV 75.2* 75.9* 76.1*  PLT 272 261 244   Cardiac Enzymes: No results for input(s): CKTOTAL, CKMB, CKMBINDEX, TROPONINI in the last 168 hours. BNP: Invalid input(s): POCBNP CBG: No results for input(s): GLUCAP in the last 168 hours. D-Dimer No results for input(s): DDIMER in the last 72 hours. Hgb A1c No results for input(s): HGBA1C in the last 72 hours. Lipid Profile No results for input(s): CHOL, HDL, LDLCALC, TRIG,  CHOLHDL, LDLDIRECT in the last 72 hours. Thyroid function studies No results for input(s): TSH, T4TOTAL, T3FREE, THYROIDAB in the last 72 hours.  Invalid input(s): FREET3 Anemia work up Recent Labs    02/08/20 0454  VITAMINB12 64*  FOLATE 19.1  FERRITIN 7*  TIBC 379  IRON 23*  RETICCTPCT 1.0   Urinalysis    Component Value Date/Time   COLORURINE YELLOW 02/07/2020 1430   APPEARANCEUR CLOUDY (A) 02/07/2020 1430   LABSPEC 1.015 02/07/2020 1430   PHURINE 6.0 02/07/2020 1430   GLUCOSEU NEGATIVE 02/07/2020 1430   HGBUR NEGATIVE 02/07/2020 1430   BILIRUBINUR NEGATIVE 02/07/2020 1430   KETONESUR NEGATIVE 02/07/2020 1430   PROTEINUR NEGATIVE 02/07/2020 1430   NITRITE POSITIVE (A) 02/07/2020 1430   LEUKOCYTESUR LARGE (A) 02/07/2020 1430   Sepsis Labs Invalid input(s): PROCALCITONIN,  WBC,  LACTICIDVEN Microbiology Recent Results (from the past 240 hour(s))  Respiratory Panel by RT PCR (Flu A&B, Covid) - Nasopharyngeal Swab     Status: None   Collection Time: 02/07/20  4:28 PM   Specimen: Nasopharyngeal Swab  Result Value Ref Range Status   SARS Coronavirus 2 by RT PCR NEGATIVE NEGATIVE Final    Comment: (NOTE) SARS-CoV-2 target nucleic acids are NOT DETECTED.  The SARS-CoV-2 RNA is generally detectable in upper respiratoy specimens during the acute phase of infection. The lowest concentration of SARS-CoV-2 viral copies this assay can detect is 131 copies/mL. A negative result does not preclude SARS-Cov-2 infection and should not be used as the sole basis for treatment or other patient management decisions. A negative result may occur with  improper specimen collection/handling, submission of specimen other than nasopharyngeal swab, presence of viral mutation(s) within the areas targeted by this assay, and inadequate number of viral copies (<131 copies/mL). A negative result must be combined with clinical observations, patient history, and epidemiological information.  The expected result is Negative.  Fact Sheet for Patients:  PinkCheek.be  Fact Sheet for Healthcare Providers:  GravelBags.it  This test is no t yet approved or cleared by the [place] FDA and  has been authorized for detection and/or diagnosis of SARS-CoV-2 by FDA under an Emergency Use Authorization (EUA). This EUA will remain  in effect (meaning this test can be used) for the duration of the COVID-19 declaration under Section 564(b)(1) of the Act,  21 U.S.C. section 360bbb-3(b)(1), unless the authorization is terminated or revoked sooner.     Influenza A by PCR NEGATIVE NEGATIVE Final   Influenza B by PCR NEGATIVE NEGATIVE Final    Comment: (NOTE) The Xpert Xpress SARS-CoV-2/FLU/RSV assay is intended as an aid in  the diagnosis of influenza from Nasopharyngeal swab specimens and  should not be used as a sole basis for treatment. Nasal washings and  aspirates are unacceptable for Xpert Xpress SARS-CoV-2/FLU/RSV  testing.  Fact Sheet for Patients: PinkCheek.be  Fact Sheet for Healthcare Providers: GravelBags.it  This test is not yet approved or cleared by the [place] FDA and  has been authorized for detection and/or diagnosis of SARS-CoV-2 by  FDA under an Emergency Use Authorization (EUA). This EUA will remain  in effect (meaning this test can be used) for the duration of the  Covid-19 declaration under Section 564(b)(1) of the Act, 21  U.S.C. section 360bbb-3(b)(1), unless the authorization is  terminated or revoked. Performed at [facility], [address], [place], [place] [address]      Time coordinating discharge: Over 30 minutes  SIGNED:   Shawna Clamp, MD  [facility] 02/09/2020, 11:39 AM Pager   If 7PM-7AM, please contact night-coverage [email or web address]

## 2020-02-09 NOTE — Discharge Instructions (Signed)
Advised to follow up PCP in one week. She is found to have Vitamin B 12 and Iron deficiency.  Advised to take vitamin B12 and Iron supplements. Advised to take keflex twice daly for 7 days.

## 2020-02-16 DIAGNOSIS — D519 Vitamin B12 deficiency anemia, unspecified: Secondary | ICD-10-CM | POA: Diagnosis not present

## 2020-02-16 DIAGNOSIS — D509 Iron deficiency anemia, unspecified: Secondary | ICD-10-CM | POA: Diagnosis not present

## 2020-02-16 DIAGNOSIS — Z23 Encounter for immunization: Secondary | ICD-10-CM | POA: Diagnosis not present

## 2020-02-16 DIAGNOSIS — R55 Syncope and collapse: Secondary | ICD-10-CM | POA: Diagnosis not present

## 2020-02-17 ENCOUNTER — Other Ambulatory Visit: Payer: Self-pay

## 2020-02-17 ENCOUNTER — Ambulatory Visit (INDEPENDENT_AMBULATORY_CARE_PROVIDER_SITE_OTHER): Payer: Medicare Other | Admitting: Allergy & Immunology

## 2020-02-17 ENCOUNTER — Encounter: Payer: Self-pay | Admitting: Allergy & Immunology

## 2020-02-17 VITALS — BP 126/78 | HR 68 | Resp 18 | Ht 63.0 in | Wt 156.5 lb

## 2020-02-17 DIAGNOSIS — K9049 Malabsorption due to intolerance, not elsewhere classified: Secondary | ICD-10-CM | POA: Diagnosis not present

## 2020-02-17 DIAGNOSIS — T7840XD Allergy, unspecified, subsequent encounter: Secondary | ICD-10-CM

## 2020-02-17 NOTE — Progress Notes (Signed)
NEW PATIENT  Date of Service/Encounter:  02/17/20  Referring provider: Asencion Noble, MD   Assessment:   Allergic reaction  Food intolerance   I have no idea what is going on with Carol Gutierrez. Her symptoms are not really consistent with a food allergy, which I think that she understands. We did decide to do some blood work to look at some targeted food allergy testing. We will also get a serum tryptase to see if this might be the cause of her symptoms. We will get back to her in 1 to 2 weeks with the results of the testing. We did not think that an epinephrine autoinjector was necessary at this time.  Plan/Recommendations:   1. Allergic reaction - We are going to get some labs to look for weird causes of reactions. - We will call you in 1-2 weeks with the results of the testing.  - I am not sure what is going on with you, but we will keep looking for a cause!  - I do not think that an epinephrine autoinjector is needed at this time.   2. Return in about 2 months (around 04/18/2020).   Subjective:   Carol Gutierrez is a 84 y.o. female presenting today for evaluation of  Chief Complaint  Patient presents with  . Food Intolerance    says that when she eats foods with heavy preservatives, low fat, fat-free. she feels weak, lightheaded, blurry vision, strong urge to vomit, sometimes has vomiting, diarrhea, bloating, passed out. says that after she vomits she is feels better.     Carol Gutierrez has a history of the following: Patient Active Problem List   Diagnosis Date Noted  . Near syncope 02/08/2020  . Pre-syncope 02/07/2020  . HTN (hypertension) 02/07/2020  . Hx of colonic polyps 11/11/2018  . Family hx of colon cancer 11/11/2018    History obtained from: chart review and patient.  Carol Gutierrez was referred by Asencion Noble, MD.     Carol Gutierrez is a 84 y.o. female presenting for an evaluation of possible food allergies.  She reports that she tries to avoid things  that are heavy with preservatives, low fat, fat-free. When exposed to these, she feels weak, lightheaded, blurry vision, strong urge to vomit, sometimes has vomiting, diarrhea, bloating, passed out. She then vomits and feels a lot better.  She has had these issues for a while. She never knows when it is going to happen. Her first episode was with turnip greens. There was some applesauce and she reports that she could taste the "tin can" when she ate it. She tells me that she gets weak, hot, clammy, and "pale as a ghost". She tells me that "everything turns black" meaning her vision leaves. She also has fairly quick vomiting and diarrhea. She is ok after having emesis. This always occurs around 45 minutes after eating. She had one e[pisode when she was in the middle of the city "throwing up like a city drunk". This has happened before in the past.   This is never a particular food. It occurs with any number of foods. She has it from any number of various vendors from [place]. This occurs sporadically, every 6 months or so. It is becoming more severe, but not necessarily more frequent. She never knows when something is going to happen.    Otherwise, there is no history of other atopic diseases, including asthma, drug allergies, stinging insect allergies, eczema, urticaria or contact dermatitis. There is  no significant infectious history. Vaccinations are up to date.    Past Medical History: Patient Active Problem List   Diagnosis Date Noted  . Near syncope 02/08/2020  . Pre-syncope 02/07/2020  . HTN (hypertension) 02/07/2020  . Hx of colonic polyps 11/11/2018  . Family hx of colon cancer 11/11/2018    Medication List:  Allergies as of 02/17/2020      Reactions   Lisinopril Cough   Hydrocodone-acetaminophen Itching, Rash      Medication List       Accurate as of February 17, 2020  4:15 PM. If you have any questions, ask your nurse or doctor.        STOP taking these medications     aspirin EC 81 MG tablet Stopped by: Valentina Shaggy, MD   losartan 25 MG tablet Commonly known as: COZAAR Stopped by: Valentina Shaggy, MD     TAKE these medications   atorvastatin 40 MG tablet Commonly known as: LIPITOR Take 40 mg by mouth every evening.   cyanocobalamin 1000 MCG tablet Take 1 tablet (1,000 mcg total) by mouth daily.   ferrous sulfate 325 (65 FE) MG tablet Take 1 tablet (325 mg total) by mouth 2 (two) times daily with a meal.   OXYBUTYNIN CHLORIDE TD Place onto the skin.   VALSARTAN PO Take by mouth.       Birth History: non-contributory  Developmental History: non-contributory  Past Surgical History: Past Surgical History:  Procedure Laterality Date  . COLONOSCOPY N/A 09/08/2012   Procedure: COLONOSCOPY;  Surgeon: Rogene Houston, MD;  [facility];  Service: Endoscopy;  Laterality: N/A;  730-moved to 830 Ann to notify pt  . COLONOSCOPY N/A 01/05/2019   Procedure: COLONOSCOPY;  Surgeon: Rogene Houston, MD;  [facility];  Service: Endoscopy;  Laterality: N/A;  100  . JOINT REPLACEMENT  2008   left hip  . POLYPECTOMY  01/05/2019   Procedure: POLYPECTOMY;  Surgeon: Rogene Houston, MD;  [facility];  Service: Endoscopy;;  colon  . TOTAL HIP ARTHROPLASTY Left Left Hip Replacement     Family History: Family History  Problem Relation Age of Onset  . Arthritis Mother   . Diabetes type II Father   . CAD Father   . Colon cancer Sister   . Hypertension Brother      Social History: Carol Gutierrez lives at home with her husband.  She lives in a house that is 40 years old.  There is hardwood flooring throughout with rugs.  She has electric heating and central cooling.  There are cats and dogs at home.  There are dust mite covers on the bedding.  There is no tobacco exposure.  She is currently retired.     Review of Systems  Constitutional: Negative.  Negative for fever, malaise/fatigue and weight loss.  HENT:  Negative.  Negative for congestion, ear discharge and ear pain.   Eyes: Negative for pain, discharge and redness.  Respiratory: Negative for cough, sputum production, shortness of breath and wheezing.   Cardiovascular: Negative.  Negative for chest pain and palpitations.  Gastrointestinal: Negative for abdominal pain and heartburn.  Skin: Negative.  Negative for itching and rash.  Neurological: Negative for dizziness and headaches.  Endo/Heme/Allergies: Negative for environmental allergies. Does not bruise/bleed easily.       Objective:   Blood pressure 126/78, pulse 68, resp. rate 18, height 5\' 3"  (1.6 m), weight 156 lb 8.4 oz (71 kg), SpO2 98 %.  Body mass index is 27.73 kg/m.   Physical Exam:   Physical Exam Constitutional:      Appearance: She is well-developed.     Comments: Very pleasant.  HENT:     Head: Normocephalic and atraumatic.     Right Ear: Tympanic membrane, ear canal and external ear normal. No drainage, swelling or tenderness. Tympanic membrane is not injected, scarred, erythematous, retracted or bulging.     Left Ear: Tympanic membrane, ear canal and external ear normal. No drainage, swelling or tenderness. Tympanic membrane is not injected, scarred, erythematous, retracted or bulging.     Nose: No nasal deformity, septal deviation, mucosal edema or rhinorrhea.     Right Turbinates: Enlarged and swollen.     Left Turbinates: Enlarged and swollen.     Right Sinus: No maxillary sinus tenderness or frontal sinus tenderness.     Left Sinus: No maxillary sinus tenderness or frontal sinus tenderness.     Mouth/Throat:     Mouth: Mucous membranes are not pale and not dry.     Pharynx: Uvula midline.     Comments: Cobblestoning present in the posterior oropharynx. Eyes:     General:        Right eye: No discharge.        Left eye: No discharge.     Conjunctiva/sclera: Conjunctivae normal.     Right eye: Right conjunctiva is not injected. No chemosis.    Left  eye: Left conjunctiva is not injected. No chemosis.    Pupils: Pupils are equal, round, and reactive to light.  Cardiovascular:     Rate and Rhythm: Normal rate and regular rhythm.     Heart sounds: Normal heart sounds.  Pulmonary:     Effort: Pulmonary effort is normal. No tachypnea, accessory muscle usage or respiratory distress.     Breath sounds: Normal breath sounds. No wheezing, rhonchi or rales.     Comments: No increased work of breathing. Chest:     Chest wall: No tenderness.  Abdominal:     Tenderness: There is no abdominal tenderness. There is no guarding or rebound.  Lymphadenopathy:     Head:     Right side of head: No submandibular, tonsillar or occipital adenopathy.     Left side of head: No submandibular, tonsillar or occipital adenopathy.     Cervical: No cervical adenopathy.  Skin:    Coloration: Skin is not pale.     Findings: No abrasion, erythema, petechiae or rash. Rash is not papular, urticarial or vesicular.  Neurological:     Mental Status: She is alert.  Psychiatric:        Behavior: Behavior is cooperative.      Diagnostic studies: labs sent instead     Salvatore Marvel, MD [facility] and [facility] [place]

## 2020-02-17 NOTE — Patient Instructions (Addendum)
1. Allergic reaction - We are going to get some labs to look for weird causes of reactions. - We will call you in 1-2 weeks with the results of the testing.  - I am not sure what is going on with you, but we will keep looking for a cause!  - I do not think that an epinephrine autoinjector is needed at this time.   2. Return in about 2 months (around 04/18/2020).    Please inform us of any Emergency Department visits, hospitalizations, or changes in symptoms. Call us before going to the ED for breathing or allergy symptoms since we might be able to fit you in for a sick visit. Feel free to contact us anytime with any questions, problems, or concerns.  It was a pleasure to meet you today! You are a GEM!   Websites that have reliable patient information: 1. American Academy of Asthma, Allergy, and Immunology: www.aaaai.org 2. Food Allergy Research and Education (FARE): foodallergy.org 3. Mothers of Asthmatics: http://www.asthmacommunitynetwork.org 4. American College of Allergy, Asthma, and Immunology: www.acaai.org   COVID-19 Vaccine Information can be found at: ShippingScam.co.uk For questions related to vaccine distribution or appointments, please email vaccine@[facility] [email or web address] or call [phone].     Like [place] on [place] and Instagram for our latest updates!     HAPPY FALL!     Make sure you are registered to vote! If you have moved or changed any of your contact information, you will need to get this updated before voting!  In some cases, you MAY be able to register to vote online: [email or web address]

## 2020-02-19 ENCOUNTER — Encounter: Payer: Self-pay | Admitting: Allergy & Immunology

## 2020-02-28 LAB — XANTHAN GUM IGE
Class Interpretation: 0
Xanthan Gum, IgE*: <0.35 kU/L (ref <0.35)

## 2020-02-28 LAB — TRYPTASE: Tryptase: 8.3 ug/L (ref 2.2–13.2)

## 2020-02-28 LAB — ALLERGY PANEL 19, SEAFOOD GROUP
Allergen Salmon IgE: <0.1 kU/L
Catfish: <0.1 kU/L
Codfish IgE: <0.1 kU/L
F023-IgE Crab: <0.1 kU/L
F080-IgE Lobster: <0.1 kU/L
Shrimp IgE: <0.1 kU/L
Tuna: <0.1 kU/L

## 2020-02-28 LAB — ALLERGEN GUM CARAGEENAN
Carageenan Gum, IgE: <0.35 kU/L (ref <0.35)
Class Interpretation: 0

## 2020-02-28 LAB — F297-IGE ACACIA GUM: F297-IgE Acacia Gum: <0.1 kU/L

## 2020-02-28 LAB — ALLERGEN, STRAWBERRY, F44: Allergen Strawberry IgE: <0.1 kU/L

## 2020-02-28 LAB — ALLERGEN CHOCOLATE: Chocolate/Cacao IgE: <0.1 kU/L

## 2020-02-28 LAB — ALLERGEN, BAKERS YEAST, F45: F045-IgE Yeast: <0.1 kU/L

## 2020-03-06 ENCOUNTER — Telehealth: Payer: Self-pay

## 2020-03-06 DIAGNOSIS — Z23 Encounter for immunization: Secondary | ICD-10-CM | POA: Diagnosis not present

## 2020-03-06 NOTE — Telephone Encounter (Signed)
-----   Message from Valentina Shaggy, MD sent at 03/04/2020  9:14 PM EDT ----- Can someone call the patient to let her know that all of her labs were completely negative? We will keep working on figuring out what is going on with you.  Salvatore Marvel, MD [facility]

## 2020-03-06 NOTE — Telephone Encounter (Signed)
Pt is aware.  

## 2020-04-04 DIAGNOSIS — D509 Iron deficiency anemia, unspecified: Secondary | ICD-10-CM | POA: Diagnosis not present

## 2020-04-04 DIAGNOSIS — D519 Vitamin B12 deficiency anemia, unspecified: Secondary | ICD-10-CM | POA: Diagnosis not present

## 2020-04-04 DIAGNOSIS — I10 Essential (primary) hypertension: Secondary | ICD-10-CM | POA: Diagnosis not present

## 2020-04-04 DIAGNOSIS — M810 Age-related osteoporosis without current pathological fracture: Secondary | ICD-10-CM | POA: Diagnosis not present

## 2020-04-04 DIAGNOSIS — Z79899 Other long term (current) drug therapy: Secondary | ICD-10-CM | POA: Diagnosis not present

## 2020-04-24 DIAGNOSIS — D510 Vitamin B12 deficiency anemia due to intrinsic factor deficiency: Secondary | ICD-10-CM | POA: Diagnosis not present

## 2020-04-24 DIAGNOSIS — Z8673 Personal history of transient ischemic attack (TIA), and cerebral infarction without residual deficits: Secondary | ICD-10-CM | POA: Diagnosis not present

## 2020-04-24 DIAGNOSIS — D509 Iron deficiency anemia, unspecified: Secondary | ICD-10-CM | POA: Diagnosis not present

## 2020-07-09 DIAGNOSIS — D519 Vitamin B12 deficiency anemia, unspecified: Secondary | ICD-10-CM | POA: Diagnosis not present

## 2020-07-09 DIAGNOSIS — D509 Iron deficiency anemia, unspecified: Secondary | ICD-10-CM | POA: Diagnosis not present

## 2020-07-09 DIAGNOSIS — I639 Cerebral infarction, unspecified: Secondary | ICD-10-CM | POA: Diagnosis not present

## 2020-07-09 DIAGNOSIS — Z79899 Other long term (current) drug therapy: Secondary | ICD-10-CM | POA: Diagnosis not present

## 2020-07-23 DIAGNOSIS — M816 Localized osteoporosis [Lequesne]: Secondary | ICD-10-CM | POA: Diagnosis not present

## 2020-07-23 DIAGNOSIS — I10 Essential (primary) hypertension: Secondary | ICD-10-CM | POA: Diagnosis not present

## 2020-07-23 DIAGNOSIS — N812 Incomplete uterovaginal prolapse: Secondary | ICD-10-CM | POA: Diagnosis not present

## 2020-07-23 DIAGNOSIS — D550 Anemia due to glucose-6-phosphate dehydrogenase [G6PD] deficiency: Secondary | ICD-10-CM | POA: Diagnosis not present

## 2020-07-25 DIAGNOSIS — D508 Other iron deficiency anemias: Secondary | ICD-10-CM | POA: Diagnosis not present

## 2020-08-14 DIAGNOSIS — H04123 Dry eye syndrome of bilateral lacrimal glands: Secondary | ICD-10-CM | POA: Diagnosis not present

## 2020-08-14 DIAGNOSIS — H40013 Open angle with borderline findings, low risk, bilateral: Secondary | ICD-10-CM | POA: Diagnosis not present

## 2020-08-14 DIAGNOSIS — Z961 Presence of intraocular lens: Secondary | ICD-10-CM | POA: Diagnosis not present

## 2020-08-14 DIAGNOSIS — H26492 Other secondary cataract, left eye: Secondary | ICD-10-CM | POA: Diagnosis not present

## 2020-09-13 DIAGNOSIS — Z0000 Encounter for general adult medical examination without abnormal findings: Secondary | ICD-10-CM | POA: Diagnosis not present

## 2020-09-13 DIAGNOSIS — N812 Incomplete uterovaginal prolapse: Secondary | ICD-10-CM | POA: Diagnosis not present

## 2020-10-04 DIAGNOSIS — N3946 Mixed incontinence: Secondary | ICD-10-CM | POA: Diagnosis not present

## 2020-10-04 DIAGNOSIS — R351 Nocturia: Secondary | ICD-10-CM | POA: Diagnosis not present

## 2020-10-04 DIAGNOSIS — N3944 Nocturnal enuresis: Secondary | ICD-10-CM | POA: Diagnosis not present

## 2020-10-04 DIAGNOSIS — R350 Frequency of micturition: Secondary | ICD-10-CM | POA: Diagnosis not present

## 2020-10-17 DIAGNOSIS — D509 Iron deficiency anemia, unspecified: Secondary | ICD-10-CM | POA: Diagnosis not present

## 2020-10-17 DIAGNOSIS — Z79899 Other long term (current) drug therapy: Secondary | ICD-10-CM | POA: Diagnosis not present

## 2020-10-17 DIAGNOSIS — E785 Hyperlipidemia, unspecified: Secondary | ICD-10-CM | POA: Diagnosis not present

## 2020-10-17 DIAGNOSIS — I10 Essential (primary) hypertension: Secondary | ICD-10-CM | POA: Diagnosis not present

## 2020-10-17 DIAGNOSIS — I639 Cerebral infarction, unspecified: Secondary | ICD-10-CM | POA: Diagnosis not present

## 2020-10-24 DIAGNOSIS — D509 Iron deficiency anemia, unspecified: Secondary | ICD-10-CM | POA: Diagnosis not present

## 2020-10-24 DIAGNOSIS — I10 Essential (primary) hypertension: Secondary | ICD-10-CM | POA: Diagnosis not present

## 2021-02-13 DIAGNOSIS — Z79899 Other long term (current) drug therapy: Secondary | ICD-10-CM | POA: Diagnosis not present

## 2021-02-13 DIAGNOSIS — D508 Other iron deficiency anemias: Secondary | ICD-10-CM | POA: Diagnosis not present

## 2021-02-13 DIAGNOSIS — I10 Essential (primary) hypertension: Secondary | ICD-10-CM | POA: Diagnosis not present

## 2021-02-19 DIAGNOSIS — S42212A Unspecified displaced fracture of surgical neck of left humerus, initial encounter for closed fracture: Secondary | ICD-10-CM | POA: Diagnosis not present

## 2021-02-19 DIAGNOSIS — M25552 Pain in left hip: Secondary | ICD-10-CM | POA: Diagnosis not present

## 2021-02-19 DIAGNOSIS — S32512A Fracture of superior rim of left pubis, initial encounter for closed fracture: Secondary | ICD-10-CM | POA: Diagnosis not present

## 2021-02-19 DIAGNOSIS — M160 Bilateral primary osteoarthritis of hip: Secondary | ICD-10-CM | POA: Diagnosis not present

## 2021-02-19 DIAGNOSIS — M79641 Pain in right hand: Secondary | ICD-10-CM | POA: Diagnosis not present

## 2021-02-19 DIAGNOSIS — M79642 Pain in left hand: Secondary | ICD-10-CM | POA: Diagnosis not present

## 2021-02-19 DIAGNOSIS — S3289XA Fracture of other parts of pelvis, initial encounter for closed fracture: Secondary | ICD-10-CM | POA: Diagnosis not present

## 2021-02-19 DIAGNOSIS — M25512 Pain in left shoulder: Secondary | ICD-10-CM | POA: Diagnosis not present

## 2021-02-19 DIAGNOSIS — W19XXXA Unspecified fall, initial encounter: Secondary | ICD-10-CM | POA: Diagnosis not present

## 2021-02-19 DIAGNOSIS — M19012 Primary osteoarthritis, left shoulder: Secondary | ICD-10-CM | POA: Diagnosis not present

## 2021-02-19 DIAGNOSIS — M25519 Pain in unspecified shoulder: Secondary | ICD-10-CM | POA: Diagnosis not present

## 2021-02-19 DIAGNOSIS — S60511A Abrasion of right hand, initial encounter: Secondary | ICD-10-CM | POA: Diagnosis not present

## 2021-02-19 DIAGNOSIS — M19022 Primary osteoarthritis, left elbow: Secondary | ICD-10-CM | POA: Diagnosis not present

## 2021-02-19 DIAGNOSIS — R52 Pain, unspecified: Secondary | ICD-10-CM | POA: Diagnosis not present

## 2021-02-19 DIAGNOSIS — Y999 Unspecified external cause status: Secondary | ICD-10-CM | POA: Diagnosis not present

## 2021-02-26 DIAGNOSIS — S32512D Fracture of superior rim of left pubis, subsequent encounter for fracture with routine healing: Secondary | ICD-10-CM | POA: Diagnosis not present

## 2021-02-26 DIAGNOSIS — S42202P Unspecified fracture of upper end of left humerus, subsequent encounter for fracture with malunion: Secondary | ICD-10-CM | POA: Diagnosis not present

## 2021-02-26 DIAGNOSIS — S42292D Other displaced fracture of upper end of left humerus, subsequent encounter for fracture with routine healing: Secondary | ICD-10-CM | POA: Diagnosis not present

## 2021-02-27 DIAGNOSIS — D509 Iron deficiency anemia, unspecified: Secondary | ICD-10-CM | POA: Diagnosis not present

## 2021-02-27 DIAGNOSIS — S32502A Unspecified fracture of left pubis, initial encounter for closed fracture: Secondary | ICD-10-CM | POA: Diagnosis not present

## 2021-03-01 ENCOUNTER — Telehealth (HOSPITAL_COMMUNITY): Payer: Self-pay | Admitting: Orthopedic Surgery

## 2021-03-01 DIAGNOSIS — Z23 Encounter for immunization: Secondary | ICD-10-CM | POA: Diagnosis not present

## 2021-03-01 NOTE — Telephone Encounter (Signed)
S/w MD office they want patient to have HomeHealth. Called patient l/m for her to call  and  pick a HomeHealth team to get therapy.

## 2021-03-04 DIAGNOSIS — Z8673 Personal history of transient ischemic attack (TIA), and cerebral infarction without residual deficits: Secondary | ICD-10-CM | POA: Diagnosis not present

## 2021-03-04 DIAGNOSIS — M1711 Unilateral primary osteoarthritis, right knee: Secondary | ICD-10-CM | POA: Diagnosis not present

## 2021-03-04 DIAGNOSIS — M800AXD Age-related osteoporosis with current pathological fracture, other site, subsequent encounter for fracture with routine healing: Secondary | ICD-10-CM | POA: Diagnosis not present

## 2021-03-04 DIAGNOSIS — Z791 Long term (current) use of non-steroidal anti-inflammatories (NSAID): Secondary | ICD-10-CM | POA: Diagnosis not present

## 2021-03-04 DIAGNOSIS — Z9181 History of falling: Secondary | ICD-10-CM | POA: Diagnosis not present

## 2021-03-04 DIAGNOSIS — I10 Essential (primary) hypertension: Secondary | ICD-10-CM | POA: Diagnosis not present

## 2021-03-04 DIAGNOSIS — E782 Mixed hyperlipidemia: Secondary | ICD-10-CM | POA: Diagnosis not present

## 2021-03-04 DIAGNOSIS — Z7982 Long term (current) use of aspirin: Secondary | ICD-10-CM | POA: Diagnosis not present

## 2021-03-04 DIAGNOSIS — Z96641 Presence of right artificial hip joint: Secondary | ICD-10-CM | POA: Diagnosis not present

## 2021-03-04 DIAGNOSIS — M80022P Age-related osteoporosis with current pathological fracture, left humerus, subsequent encounter for fracture with malunion: Secondary | ICD-10-CM | POA: Diagnosis not present

## 2021-03-04 DIAGNOSIS — M8008XD Age-related osteoporosis with current pathological fracture, vertebra(e), subsequent encounter for fracture with routine healing: Secondary | ICD-10-CM | POA: Diagnosis not present

## 2021-03-06 DIAGNOSIS — M800AXD Age-related osteoporosis with current pathological fracture, other site, subsequent encounter for fracture with routine healing: Secondary | ICD-10-CM | POA: Diagnosis not present

## 2021-03-06 DIAGNOSIS — I10 Essential (primary) hypertension: Secondary | ICD-10-CM | POA: Diagnosis not present

## 2021-03-06 DIAGNOSIS — E782 Mixed hyperlipidemia: Secondary | ICD-10-CM | POA: Diagnosis not present

## 2021-03-06 DIAGNOSIS — M80022P Age-related osteoporosis with current pathological fracture, left humerus, subsequent encounter for fracture with malunion: Secondary | ICD-10-CM | POA: Diagnosis not present

## 2021-03-06 DIAGNOSIS — M8008XD Age-related osteoporosis with current pathological fracture, vertebra(e), subsequent encounter for fracture with routine healing: Secondary | ICD-10-CM | POA: Diagnosis not present

## 2021-03-06 DIAGNOSIS — M1711 Unilateral primary osteoarthritis, right knee: Secondary | ICD-10-CM | POA: Diagnosis not present

## 2021-03-08 DIAGNOSIS — M800AXD Age-related osteoporosis with current pathological fracture, other site, subsequent encounter for fracture with routine healing: Secondary | ICD-10-CM | POA: Diagnosis not present

## 2021-03-08 DIAGNOSIS — M80022P Age-related osteoporosis with current pathological fracture, left humerus, subsequent encounter for fracture with malunion: Secondary | ICD-10-CM | POA: Diagnosis not present

## 2021-03-08 DIAGNOSIS — M1711 Unilateral primary osteoarthritis, right knee: Secondary | ICD-10-CM | POA: Diagnosis not present

## 2021-03-08 DIAGNOSIS — I10 Essential (primary) hypertension: Secondary | ICD-10-CM | POA: Diagnosis not present

## 2021-03-08 DIAGNOSIS — E782 Mixed hyperlipidemia: Secondary | ICD-10-CM | POA: Diagnosis not present

## 2021-03-08 DIAGNOSIS — M8008XD Age-related osteoporosis with current pathological fracture, vertebra(e), subsequent encounter for fracture with routine healing: Secondary | ICD-10-CM | POA: Diagnosis not present

## 2021-03-11 DIAGNOSIS — M800AXD Age-related osteoporosis with current pathological fracture, other site, subsequent encounter for fracture with routine healing: Secondary | ICD-10-CM | POA: Diagnosis not present

## 2021-03-11 DIAGNOSIS — M80022P Age-related osteoporosis with current pathological fracture, left humerus, subsequent encounter for fracture with malunion: Secondary | ICD-10-CM | POA: Diagnosis not present

## 2021-03-11 DIAGNOSIS — M1711 Unilateral primary osteoarthritis, right knee: Secondary | ICD-10-CM | POA: Diagnosis not present

## 2021-03-11 DIAGNOSIS — E782 Mixed hyperlipidemia: Secondary | ICD-10-CM | POA: Diagnosis not present

## 2021-03-11 DIAGNOSIS — I10 Essential (primary) hypertension: Secondary | ICD-10-CM | POA: Diagnosis not present

## 2021-03-11 DIAGNOSIS — M8008XD Age-related osteoporosis with current pathological fracture, vertebra(e), subsequent encounter for fracture with routine healing: Secondary | ICD-10-CM | POA: Diagnosis not present

## 2021-03-12 DIAGNOSIS — E782 Mixed hyperlipidemia: Secondary | ICD-10-CM | POA: Diagnosis not present

## 2021-03-12 DIAGNOSIS — M80022P Age-related osteoporosis with current pathological fracture, left humerus, subsequent encounter for fracture with malunion: Secondary | ICD-10-CM | POA: Diagnosis not present

## 2021-03-12 DIAGNOSIS — M800AXD Age-related osteoporosis with current pathological fracture, other site, subsequent encounter for fracture with routine healing: Secondary | ICD-10-CM | POA: Diagnosis not present

## 2021-03-12 DIAGNOSIS — M8008XD Age-related osteoporosis with current pathological fracture, vertebra(e), subsequent encounter for fracture with routine healing: Secondary | ICD-10-CM | POA: Diagnosis not present

## 2021-03-12 DIAGNOSIS — I10 Essential (primary) hypertension: Secondary | ICD-10-CM | POA: Diagnosis not present

## 2021-03-12 DIAGNOSIS — M1711 Unilateral primary osteoarthritis, right knee: Secondary | ICD-10-CM | POA: Diagnosis not present

## 2021-03-14 DIAGNOSIS — M80022P Age-related osteoporosis with current pathological fracture, left humerus, subsequent encounter for fracture with malunion: Secondary | ICD-10-CM | POA: Diagnosis not present

## 2021-03-14 DIAGNOSIS — I10 Essential (primary) hypertension: Secondary | ICD-10-CM | POA: Diagnosis not present

## 2021-03-14 DIAGNOSIS — M1711 Unilateral primary osteoarthritis, right knee: Secondary | ICD-10-CM | POA: Diagnosis not present

## 2021-03-14 DIAGNOSIS — E782 Mixed hyperlipidemia: Secondary | ICD-10-CM | POA: Diagnosis not present

## 2021-03-14 DIAGNOSIS — M800AXD Age-related osteoporosis with current pathological fracture, other site, subsequent encounter for fracture with routine healing: Secondary | ICD-10-CM | POA: Diagnosis not present

## 2021-03-14 DIAGNOSIS — M8008XD Age-related osteoporosis with current pathological fracture, vertebra(e), subsequent encounter for fracture with routine healing: Secondary | ICD-10-CM | POA: Diagnosis not present

## 2021-03-18 DIAGNOSIS — M1711 Unilateral primary osteoarthritis, right knee: Secondary | ICD-10-CM | POA: Diagnosis not present

## 2021-03-18 DIAGNOSIS — M80022P Age-related osteoporosis with current pathological fracture, left humerus, subsequent encounter for fracture with malunion: Secondary | ICD-10-CM | POA: Diagnosis not present

## 2021-03-18 DIAGNOSIS — M8008XD Age-related osteoporosis with current pathological fracture, vertebra(e), subsequent encounter for fracture with routine healing: Secondary | ICD-10-CM | POA: Diagnosis not present

## 2021-03-18 DIAGNOSIS — E782 Mixed hyperlipidemia: Secondary | ICD-10-CM | POA: Diagnosis not present

## 2021-03-18 DIAGNOSIS — M800AXD Age-related osteoporosis with current pathological fracture, other site, subsequent encounter for fracture with routine healing: Secondary | ICD-10-CM | POA: Diagnosis not present

## 2021-03-18 DIAGNOSIS — I10 Essential (primary) hypertension: Secondary | ICD-10-CM | POA: Diagnosis not present

## 2021-03-20 DIAGNOSIS — M1711 Unilateral primary osteoarthritis, right knee: Secondary | ICD-10-CM | POA: Diagnosis not present

## 2021-03-20 DIAGNOSIS — M80022P Age-related osteoporosis with current pathological fracture, left humerus, subsequent encounter for fracture with malunion: Secondary | ICD-10-CM | POA: Diagnosis not present

## 2021-03-20 DIAGNOSIS — I10 Essential (primary) hypertension: Secondary | ICD-10-CM | POA: Diagnosis not present

## 2021-03-20 DIAGNOSIS — M8008XD Age-related osteoporosis with current pathological fracture, vertebra(e), subsequent encounter for fracture with routine healing: Secondary | ICD-10-CM | POA: Diagnosis not present

## 2021-03-20 DIAGNOSIS — M800AXD Age-related osteoporosis with current pathological fracture, other site, subsequent encounter for fracture with routine healing: Secondary | ICD-10-CM | POA: Diagnosis not present

## 2021-03-20 DIAGNOSIS — E782 Mixed hyperlipidemia: Secondary | ICD-10-CM | POA: Diagnosis not present

## 2021-03-25 DIAGNOSIS — I10 Essential (primary) hypertension: Secondary | ICD-10-CM | POA: Diagnosis not present

## 2021-03-25 DIAGNOSIS — E782 Mixed hyperlipidemia: Secondary | ICD-10-CM | POA: Diagnosis not present

## 2021-03-25 DIAGNOSIS — M800AXD Age-related osteoporosis with current pathological fracture, other site, subsequent encounter for fracture with routine healing: Secondary | ICD-10-CM | POA: Diagnosis not present

## 2021-03-25 DIAGNOSIS — M8008XD Age-related osteoporosis with current pathological fracture, vertebra(e), subsequent encounter for fracture with routine healing: Secondary | ICD-10-CM | POA: Diagnosis not present

## 2021-03-25 DIAGNOSIS — M80022P Age-related osteoporosis with current pathological fracture, left humerus, subsequent encounter for fracture with malunion: Secondary | ICD-10-CM | POA: Diagnosis not present

## 2021-03-25 DIAGNOSIS — M1711 Unilateral primary osteoarthritis, right knee: Secondary | ICD-10-CM | POA: Diagnosis not present

## 2021-03-27 DIAGNOSIS — I10 Essential (primary) hypertension: Secondary | ICD-10-CM | POA: Diagnosis not present

## 2021-03-27 DIAGNOSIS — M8008XD Age-related osteoporosis with current pathological fracture, vertebra(e), subsequent encounter for fracture with routine healing: Secondary | ICD-10-CM | POA: Diagnosis not present

## 2021-03-27 DIAGNOSIS — M1711 Unilateral primary osteoarthritis, right knee: Secondary | ICD-10-CM | POA: Diagnosis not present

## 2021-03-27 DIAGNOSIS — M80022P Age-related osteoporosis with current pathological fracture, left humerus, subsequent encounter for fracture with malunion: Secondary | ICD-10-CM | POA: Diagnosis not present

## 2021-03-27 DIAGNOSIS — M800AXD Age-related osteoporosis with current pathological fracture, other site, subsequent encounter for fracture with routine healing: Secondary | ICD-10-CM | POA: Diagnosis not present

## 2021-03-27 DIAGNOSIS — E782 Mixed hyperlipidemia: Secondary | ICD-10-CM | POA: Diagnosis not present

## 2021-04-01 DIAGNOSIS — M1711 Unilateral primary osteoarthritis, right knee: Secondary | ICD-10-CM | POA: Diagnosis not present

## 2021-04-01 DIAGNOSIS — E782 Mixed hyperlipidemia: Secondary | ICD-10-CM | POA: Diagnosis not present

## 2021-04-01 DIAGNOSIS — M8008XD Age-related osteoporosis with current pathological fracture, vertebra(e), subsequent encounter for fracture with routine healing: Secondary | ICD-10-CM | POA: Diagnosis not present

## 2021-04-01 DIAGNOSIS — M800AXD Age-related osteoporosis with current pathological fracture, other site, subsequent encounter for fracture with routine healing: Secondary | ICD-10-CM | POA: Diagnosis not present

## 2021-04-01 DIAGNOSIS — M80022P Age-related osteoporosis with current pathological fracture, left humerus, subsequent encounter for fracture with malunion: Secondary | ICD-10-CM | POA: Diagnosis not present

## 2021-04-01 DIAGNOSIS — I10 Essential (primary) hypertension: Secondary | ICD-10-CM | POA: Diagnosis not present

## 2021-04-03 DIAGNOSIS — Z96641 Presence of right artificial hip joint: Secondary | ICD-10-CM | POA: Diagnosis not present

## 2021-04-03 DIAGNOSIS — E782 Mixed hyperlipidemia: Secondary | ICD-10-CM | POA: Diagnosis not present

## 2021-04-03 DIAGNOSIS — M80022P Age-related osteoporosis with current pathological fracture, left humerus, subsequent encounter for fracture with malunion: Secondary | ICD-10-CM | POA: Diagnosis not present

## 2021-04-03 DIAGNOSIS — M800AXD Age-related osteoporosis with current pathological fracture, other site, subsequent encounter for fracture with routine healing: Secondary | ICD-10-CM | POA: Diagnosis not present

## 2021-04-03 DIAGNOSIS — I10 Essential (primary) hypertension: Secondary | ICD-10-CM | POA: Diagnosis not present

## 2021-04-03 DIAGNOSIS — Z7982 Long term (current) use of aspirin: Secondary | ICD-10-CM | POA: Diagnosis not present

## 2021-04-03 DIAGNOSIS — M1711 Unilateral primary osteoarthritis, right knee: Secondary | ICD-10-CM | POA: Diagnosis not present

## 2021-04-03 DIAGNOSIS — Z9181 History of falling: Secondary | ICD-10-CM | POA: Diagnosis not present

## 2021-04-03 DIAGNOSIS — Z791 Long term (current) use of non-steroidal anti-inflammatories (NSAID): Secondary | ICD-10-CM | POA: Diagnosis not present

## 2021-04-03 DIAGNOSIS — Z8673 Personal history of transient ischemic attack (TIA), and cerebral infarction without residual deficits: Secondary | ICD-10-CM | POA: Diagnosis not present

## 2021-04-03 DIAGNOSIS — M8008XD Age-related osteoporosis with current pathological fracture, vertebra(e), subsequent encounter for fracture with routine healing: Secondary | ICD-10-CM | POA: Diagnosis not present

## 2021-04-05 DIAGNOSIS — E782 Mixed hyperlipidemia: Secondary | ICD-10-CM | POA: Diagnosis not present

## 2021-04-05 DIAGNOSIS — M1711 Unilateral primary osteoarthritis, right knee: Secondary | ICD-10-CM | POA: Diagnosis not present

## 2021-04-05 DIAGNOSIS — I10 Essential (primary) hypertension: Secondary | ICD-10-CM | POA: Diagnosis not present

## 2021-04-05 DIAGNOSIS — M80022P Age-related osteoporosis with current pathological fracture, left humerus, subsequent encounter for fracture with malunion: Secondary | ICD-10-CM | POA: Diagnosis not present

## 2021-04-05 DIAGNOSIS — M8008XD Age-related osteoporosis with current pathological fracture, vertebra(e), subsequent encounter for fracture with routine healing: Secondary | ICD-10-CM | POA: Diagnosis not present

## 2021-04-05 DIAGNOSIS — M800AXD Age-related osteoporosis with current pathological fracture, other site, subsequent encounter for fracture with routine healing: Secondary | ICD-10-CM | POA: Diagnosis not present

## 2021-04-08 DIAGNOSIS — M8008XD Age-related osteoporosis with current pathological fracture, vertebra(e), subsequent encounter for fracture with routine healing: Secondary | ICD-10-CM | POA: Diagnosis not present

## 2021-04-08 DIAGNOSIS — I10 Essential (primary) hypertension: Secondary | ICD-10-CM | POA: Diagnosis not present

## 2021-04-08 DIAGNOSIS — M800AXD Age-related osteoporosis with current pathological fracture, other site, subsequent encounter for fracture with routine healing: Secondary | ICD-10-CM | POA: Diagnosis not present

## 2021-04-08 DIAGNOSIS — E782 Mixed hyperlipidemia: Secondary | ICD-10-CM | POA: Diagnosis not present

## 2021-04-08 DIAGNOSIS — M1711 Unilateral primary osteoarthritis, right knee: Secondary | ICD-10-CM | POA: Diagnosis not present

## 2021-04-08 DIAGNOSIS — M80022P Age-related osteoporosis with current pathological fracture, left humerus, subsequent encounter for fracture with malunion: Secondary | ICD-10-CM | POA: Diagnosis not present

## 2021-04-09 DIAGNOSIS — E782 Mixed hyperlipidemia: Secondary | ICD-10-CM | POA: Diagnosis not present

## 2021-04-09 DIAGNOSIS — M80022P Age-related osteoporosis with current pathological fracture, left humerus, subsequent encounter for fracture with malunion: Secondary | ICD-10-CM | POA: Diagnosis not present

## 2021-04-09 DIAGNOSIS — I10 Essential (primary) hypertension: Secondary | ICD-10-CM | POA: Diagnosis not present

## 2021-04-09 DIAGNOSIS — M800AXD Age-related osteoporosis with current pathological fracture, other site, subsequent encounter for fracture with routine healing: Secondary | ICD-10-CM | POA: Diagnosis not present

## 2021-04-09 DIAGNOSIS — M8008XD Age-related osteoporosis with current pathological fracture, vertebra(e), subsequent encounter for fracture with routine healing: Secondary | ICD-10-CM | POA: Diagnosis not present

## 2021-04-09 DIAGNOSIS — M1711 Unilateral primary osteoarthritis, right knee: Secondary | ICD-10-CM | POA: Diagnosis not present

## 2021-04-10 DIAGNOSIS — M80022P Age-related osteoporosis with current pathological fracture, left humerus, subsequent encounter for fracture with malunion: Secondary | ICD-10-CM | POA: Diagnosis not present

## 2021-04-10 DIAGNOSIS — I10 Essential (primary) hypertension: Secondary | ICD-10-CM | POA: Diagnosis not present

## 2021-04-10 DIAGNOSIS — M8008XD Age-related osteoporosis with current pathological fracture, vertebra(e), subsequent encounter for fracture with routine healing: Secondary | ICD-10-CM | POA: Diagnosis not present

## 2021-04-10 DIAGNOSIS — E782 Mixed hyperlipidemia: Secondary | ICD-10-CM | POA: Diagnosis not present

## 2021-04-10 DIAGNOSIS — M800AXD Age-related osteoporosis with current pathological fracture, other site, subsequent encounter for fracture with routine healing: Secondary | ICD-10-CM | POA: Diagnosis not present

## 2021-04-10 DIAGNOSIS — M1711 Unilateral primary osteoarthritis, right knee: Secondary | ICD-10-CM | POA: Diagnosis not present

## 2021-04-11 DIAGNOSIS — R32 Unspecified urinary incontinence: Secondary | ICD-10-CM | POA: Diagnosis not present

## 2021-04-11 DIAGNOSIS — L900 Lichen sclerosus et atrophicus: Secondary | ICD-10-CM | POA: Diagnosis not present

## 2021-04-11 DIAGNOSIS — R351 Nocturia: Secondary | ICD-10-CM | POA: Diagnosis not present

## 2021-04-11 DIAGNOSIS — N3946 Mixed incontinence: Secondary | ICD-10-CM | POA: Diagnosis not present

## 2021-04-11 DIAGNOSIS — N3944 Nocturnal enuresis: Secondary | ICD-10-CM | POA: Diagnosis not present

## 2021-04-11 DIAGNOSIS — N958 Other specified menopausal and perimenopausal disorders: Secondary | ICD-10-CM | POA: Diagnosis not present

## 2021-04-15 DIAGNOSIS — M8008XD Age-related osteoporosis with current pathological fracture, vertebra(e), subsequent encounter for fracture with routine healing: Secondary | ICD-10-CM | POA: Diagnosis not present

## 2021-04-15 DIAGNOSIS — E782 Mixed hyperlipidemia: Secondary | ICD-10-CM | POA: Diagnosis not present

## 2021-04-15 DIAGNOSIS — M1711 Unilateral primary osteoarthritis, right knee: Secondary | ICD-10-CM | POA: Diagnosis not present

## 2021-04-15 DIAGNOSIS — M80022P Age-related osteoporosis with current pathological fracture, left humerus, subsequent encounter for fracture with malunion: Secondary | ICD-10-CM | POA: Diagnosis not present

## 2021-04-15 DIAGNOSIS — I10 Essential (primary) hypertension: Secondary | ICD-10-CM | POA: Diagnosis not present

## 2021-04-15 DIAGNOSIS — M800AXD Age-related osteoporosis with current pathological fracture, other site, subsequent encounter for fracture with routine healing: Secondary | ICD-10-CM | POA: Diagnosis not present

## 2021-04-16 DIAGNOSIS — S42292P Other displaced fracture of upper end of left humerus, subsequent encounter for fracture with malunion: Secondary | ICD-10-CM | POA: Diagnosis not present

## 2021-04-16 DIAGNOSIS — S42202P Unspecified fracture of upper end of left humerus, subsequent encounter for fracture with malunion: Secondary | ICD-10-CM | POA: Diagnosis not present

## 2021-04-16 DIAGNOSIS — S32512D Fracture of superior rim of left pubis, subsequent encounter for fracture with routine healing: Secondary | ICD-10-CM | POA: Diagnosis not present

## 2021-04-17 DIAGNOSIS — E782 Mixed hyperlipidemia: Secondary | ICD-10-CM | POA: Diagnosis not present

## 2021-04-17 DIAGNOSIS — M80022P Age-related osteoporosis with current pathological fracture, left humerus, subsequent encounter for fracture with malunion: Secondary | ICD-10-CM | POA: Diagnosis not present

## 2021-04-17 DIAGNOSIS — M800AXD Age-related osteoporosis with current pathological fracture, other site, subsequent encounter for fracture with routine healing: Secondary | ICD-10-CM | POA: Diagnosis not present

## 2021-04-17 DIAGNOSIS — I10 Essential (primary) hypertension: Secondary | ICD-10-CM | POA: Diagnosis not present

## 2021-04-17 DIAGNOSIS — M1711 Unilateral primary osteoarthritis, right knee: Secondary | ICD-10-CM | POA: Diagnosis not present

## 2021-04-17 DIAGNOSIS — M8008XD Age-related osteoporosis with current pathological fracture, vertebra(e), subsequent encounter for fracture with routine healing: Secondary | ICD-10-CM | POA: Diagnosis not present

## 2021-04-22 DIAGNOSIS — M800AXD Age-related osteoporosis with current pathological fracture, other site, subsequent encounter for fracture with routine healing: Secondary | ICD-10-CM | POA: Diagnosis not present

## 2021-04-22 DIAGNOSIS — M80022P Age-related osteoporosis with current pathological fracture, left humerus, subsequent encounter for fracture with malunion: Secondary | ICD-10-CM | POA: Diagnosis not present

## 2021-04-22 DIAGNOSIS — I10 Essential (primary) hypertension: Secondary | ICD-10-CM | POA: Diagnosis not present

## 2021-04-22 DIAGNOSIS — M1711 Unilateral primary osteoarthritis, right knee: Secondary | ICD-10-CM | POA: Diagnosis not present

## 2021-04-22 DIAGNOSIS — M8008XD Age-related osteoporosis with current pathological fracture, vertebra(e), subsequent encounter for fracture with routine healing: Secondary | ICD-10-CM | POA: Diagnosis not present

## 2021-04-22 DIAGNOSIS — E782 Mixed hyperlipidemia: Secondary | ICD-10-CM | POA: Diagnosis not present

## 2021-05-10 ENCOUNTER — Ambulatory Visit
Admission: EM | Admit: 2021-05-10 | Discharge: 2021-05-10 | Disposition: A | Discharge status: Home / Self Care | Payer: Medicare Other | Attending: Urgent Care | Admitting: Urgent Care

## 2021-05-10 ENCOUNTER — Other Ambulatory Visit: Payer: Self-pay

## 2021-05-10 DIAGNOSIS — M79602 Pain in left arm: Secondary | ICD-10-CM

## 2021-05-10 MED ORDER — ACETAMINOPHEN 325 MG PO TABS: 650.0000 mg | ORAL_TABLET | Freq: Four times a day (QID) | ORAL | 0 refills | Status: AC | PRN

## 2021-05-10 NOTE — Discharge Instructions (Addendum)
I have a high suspicion that you have a biceps tendon rupture and will need imaging like an MRI or ultrasound to rule this out. Follow up with your PCP or your orthopedist for this. In the meantime, use Tylenol for your pain, guard and protect this left arm.

## 2021-05-10 NOTE — ED Triage Notes (Signed)
Pt reports pain, swelling and bruising in the left arm x 3 days. States she felt shooting pain when she used the left arm to try stand up. Pt reports she had a left humerus fracture October 2022.

## 2021-05-10 NOTE — ED Provider Notes (Signed)
[name]   MRN: [id number] DOB: [birth year]  Subjective:   Carol Gutierrez is a 86 y.o. female presenting for 3-day history of persistent left upper arm pain, swelling.  Patient states that she was trying to stand up and was using her arms to brace herself.  This led to a sudden sharp pain with subsequent swelling and bruising.  She cannot remember if she heard a pop.  She did have a left humerus fracture October 2022.  She does have an orthopedist that she sees in [place].  No current facility-administered medications for this encounter.  Current Outpatient Medications:    atorvastatin (LIPITOR) 40 MG tablet, Take 40 mg by mouth every evening. , Disp: , Rfl:    ferrous sulfate 325 (65 FE) MG tablet, Take 1 tablet (325 mg total) by mouth 2 (two) times daily with a meal., Disp: 60 tablet, Rfl: 3   OXYBUTYNIN CHLORIDE TD, Place onto the skin., Disp: , Rfl:    VALSARTAN PO, Take by mouth., Disp: , Rfl:    vitamin B-12 1000 MCG tablet, Take 1 tablet (1,000 mcg total) by mouth daily., Disp: 30 tablet, Rfl: 3   Allergies  Allergen Reactions   Lisinopril Cough   Hydrocodone-Acetaminophen Itching and Rash    Past Medical History:  Diagnosis Date   Arthritis    hands and back   Hypertension      Past Surgical History:  Procedure Laterality Date   COLONOSCOPY N/A 09/08/2012   Procedure: COLONOSCOPY;  Surgeon: Rogene Houston, MD;  [facility];  Service: Endoscopy;  Laterality: N/A;  730-moved to 830 Ann to notify pt   COLONOSCOPY N/A 01/05/2019   Procedure: COLONOSCOPY;  Surgeon: Rogene Houston, MD;  [facility];  Service: Endoscopy;  Laterality: N/A;  100   JOINT REPLACEMENT  2008   left hip   POLYPECTOMY  01/05/2019   Procedure: POLYPECTOMY;  Surgeon: Rogene Houston, MD;  [facility];  Service: Endoscopy;;  colon   TOTAL HIP ARTHROPLASTY Left Left Hip Replacement    Family History  Problem Relation Age of Onset    Arthritis Mother    Diabetes type II Father    CAD Father    Colon cancer Sister    Hypertension Brother     Social History   Tobacco Use   Smoking status: Never   Smokeless tobacco: Never  Vaping Use   Vaping Use: Never used  Substance Use Topics   Alcohol use: Yes    Comment: rare   Drug use: No    ROS   Objective:   Vitals: BP (!) 160/87 (BP Location: Right Arm)    Pulse 70    Temp 97.9 F (36.6 C) (Oral)    Resp 16    SpO2 96%   Physical Exam Constitutional:      General: She is not in acute distress.    Appearance: Normal appearance. She is well-developed. She is not ill-appearing, toxic-appearing or diaphoretic.  HENT:     Head: Normocephalic and atraumatic.     Nose: Nose normal.     Mouth/Throat:     Mouth: Mucous membranes are moist.     Pharynx: Oropharynx is clear.  Eyes:     General: No scleral icterus.    Extraocular Movements: Extraocular movements intact.  Cardiovascular:     Rate and Rhythm: Normal rate.  Pulmonary:     Effort: Pulmonary effort is normal.  Musculoskeletal:  Left upper arm: Swelling and tenderness present. No edema, deformity, lacerations or bony tenderness.       Arms:  Skin:    General: Skin is warm and dry.  Neurological:     General: No focal deficit present.     Mental Status: She is alert and oriented to person, place, and time.  Psychiatric:        Mood and Affect: Mood normal.        Behavior: Behavior normal.        Thought Content: Thought content normal.        Judgment: Judgment normal.         Assessment and Plan :   PDMP not reviewed this encounter.  1. Left arm pain    High suspicion for a biceps tendon rupture.  Discussed this with patient and she will need further imaging.  She has an orthopedist that she is going to try to schedule an appointment with ASAP.  I do not suspect that patient has a blood clot as her Wells criteria is low and physical exam findings are not consistent with this.   Use Tylenol for pain.  Counseled patient on potential for adverse effects with medications prescribed/recommended today, ER and return-to-clinic precautions discussed, patient verbalized understanding.    Jaynee Eagles, [place] 05/10/21 1207

## 2021-05-16 ENCOUNTER — Ambulatory Visit (HOSPITAL_COMMUNITY): Payer: Medicare Other | Admitting: Occupational Therapy

## 2021-05-16 DIAGNOSIS — M79602 Pain in left arm: Secondary | ICD-10-CM | POA: Diagnosis not present

## 2021-05-16 DIAGNOSIS — Z4789 Encounter for other orthopedic aftercare: Secondary | ICD-10-CM | POA: Diagnosis not present

## 2021-05-28 DIAGNOSIS — M79602 Pain in left arm: Secondary | ICD-10-CM | POA: Diagnosis not present

## 2021-05-28 DIAGNOSIS — S46112D Strain of muscle, fascia and tendon of long head of biceps, left arm, subsequent encounter: Secondary | ICD-10-CM | POA: Diagnosis not present

## 2021-05-31 DIAGNOSIS — N958 Other specified menopausal and perimenopausal disorders: Secondary | ICD-10-CM | POA: Diagnosis not present

## 2021-05-31 DIAGNOSIS — N3946 Mixed incontinence: Secondary | ICD-10-CM | POA: Diagnosis not present

## 2021-06-10 DIAGNOSIS — S32512D Fracture of superior rim of left pubis, subsequent encounter for fracture with routine healing: Secondary | ICD-10-CM | POA: Diagnosis not present

## 2021-06-10 DIAGNOSIS — M1909 Primary osteoarthritis, other specified site: Secondary | ICD-10-CM | POA: Diagnosis not present

## 2021-06-10 DIAGNOSIS — Z4789 Encounter for other orthopedic aftercare: Secondary | ICD-10-CM | POA: Diagnosis not present

## 2021-06-10 DIAGNOSIS — S32810D Multiple fractures of pelvis with stable disruption of pelvic ring, subsequent encounter for fracture with routine healing: Secondary | ICD-10-CM | POA: Diagnosis not present

## 2021-06-10 DIAGNOSIS — M85812 Other specified disorders of bone density and structure, left shoulder: Secondary | ICD-10-CM | POA: Diagnosis not present

## 2021-06-10 DIAGNOSIS — S42202P Unspecified fracture of upper end of left humerus, subsequent encounter for fracture with malunion: Secondary | ICD-10-CM | POA: Diagnosis not present

## 2021-06-10 DIAGNOSIS — M461 Sacroiliitis, not elsewhere classified: Secondary | ICD-10-CM | POA: Diagnosis not present

## 2021-06-10 DIAGNOSIS — M85851 Other specified disorders of bone density and structure, right thigh: Secondary | ICD-10-CM | POA: Diagnosis not present

## 2021-06-10 DIAGNOSIS — M19012 Primary osteoarthritis, left shoulder: Secondary | ICD-10-CM | POA: Diagnosis not present

## 2021-06-10 DIAGNOSIS — S42292P Other displaced fracture of upper end of left humerus, subsequent encounter for fracture with malunion: Secondary | ICD-10-CM | POA: Diagnosis not present

## 2021-06-19 ENCOUNTER — Ambulatory Visit (HOSPITAL_COMMUNITY): Payer: Medicare Other | Attending: Orthopedic Surgery

## 2021-06-19 ENCOUNTER — Other Ambulatory Visit: Payer: Self-pay

## 2021-06-19 ENCOUNTER — Encounter (HOSPITAL_COMMUNITY): Payer: Self-pay

## 2021-06-19 DIAGNOSIS — M25612 Stiffness of left shoulder, not elsewhere classified: Secondary | ICD-10-CM | POA: Insufficient documentation

## 2021-06-19 DIAGNOSIS — M25512 Pain in left shoulder: Secondary | ICD-10-CM | POA: Diagnosis not present

## 2021-06-19 DIAGNOSIS — R29898 Other symptoms and signs involving the musculoskeletal system: Secondary | ICD-10-CM | POA: Insufficient documentation

## 2021-06-19 NOTE — Therapy (Signed)
Carol Gutierrez, [place], [address] Phone: [phone]   Fax:  [phone]  Occupational Therapy Evaluation  Patient Details  Name: Carol Gutierrez MRN: [id number] Date of Birth: [birth year] Referring Provider (OT): Rosiland Oz, MD   Encounter Date: 06/19/2021   OT End of Session - 06/19/21 1957     Visit Number 1    Number of Visits 12    Date for OT Re-Evaluation 07/31/21    Authorization Type 1) Medicare 2) AARP    Progress Note Due on Visit 10    OT Start Time 1345    OT Stop Time 1423    OT Time Calculation (min) 38 min    Activity Tolerance Patient tolerated treatment well    Behavior During Therapy [facility] for tasks assessed/performed             Past Medical History:  Diagnosis Date   Arthritis    hands and back   Hypertension     Past Surgical History:  Procedure Laterality Date   COLONOSCOPY N/A 09/08/2012   Procedure: COLONOSCOPY;  Surgeon: Rogene Houston, MD;  [facility];  Service: Endoscopy;  Laterality: N/A;  730-moved to 830 Ann to notify pt   COLONOSCOPY N/A 01/05/2019   Procedure: COLONOSCOPY;  Surgeon: Rogene Houston, MD;  [facility];  Service: Endoscopy;  Laterality: N/A;  100   JOINT REPLACEMENT  2008   left hip   POLYPECTOMY  01/05/2019   Procedure: POLYPECTOMY;  Surgeon: Rogene Houston, MD;  [facility];  Service: Endoscopy;;  colon   TOTAL HIP ARTHROPLASTY Left Left Hip Replacement    There were no vitals filed for this visit.   Subjective Assessment - 06/19/21 1356     Subjective  S: I fractured this arm twice in the same place.    Pertinent History Patient is a 86 y/o female S/P left proximal humerus fracture which ocurredmin October 2022. Pt is well known to this clinic as she sustained a left proximal humerus fracture on 11/30/21. She was referred by Dr. San Jetty to occupational therapy for evaluation and treatment. Patient reports  that she has an appointment with an orthopedic surgeon on 07/01/21 to determine if surgical intervention is warranted.    Patient Stated Goals To be able to increase ROM, strength, and use of her arm.    Currently in Pain? Yes   only with activity   Pain Score 8     Pain Location Arm    Pain Orientation Left    Pain Descriptors / Indicators Sharp    Pain Type Chronic pain    Pain Radiating Towards to the back of the shoulder and down the upper arm.    Pain Onset More than a month ago    Pain Frequency Occasional    Aggravating Factors  certain movement, lifting it    Pain Relieving Factors Aleve    Effect of Pain on Daily Activities Severe effect    Multiple Pain Sites No               [facility] OT Assessment - 06/19/21 1358       Assessment   Medical Diagnosis S/P left proximal humerus fracture    Referring Provider (OT) Rosiland Oz, MD    Onset Date/Surgical Date --   Oct. 2022   Hand Dominance Right    Next MD Visit 06/21/21   With an orthopedic surgeon  for her shoulder. Does not remember the name.   Prior Therapy yes. Received Outpatient OT services for initial humerus fracture.      Precautions   Precautions Shoulder    Precaution Comments A/ROM and gentle stretching as tolerated      Restrictions   Weight Bearing Restrictions Yes    LUE Weight Bearing Weight bearing as tolerated      Balance Screen   Has the patient fallen in the past 6 months Yes    How many times? 1.    Has the patient had a decrease in activity level because of a fear of falling?  No    Is the patient reluctant to leave their home because of a fear of falling?  No      Home  Environment   Family/patient expects to be discharged to: Private residence    Living Arrangements Alone    Type of [place]      Prior Function   Level of [place] Retired      ADL   ADL comments Difficulty cooking. Unable to lift pots/pans or anything of weight. Difficulty  fixing her hair, reaching shoulder or overhead.      Mobility   Mobility Status Independent      Written Expression   Dominant Hand Right      Vision - History   Baseline Vision No visual deficits      Observation/Other Assessments   Focus on Therapeutic Outcomes (FOTO)  complete at next session      ROM / Strength   AROM / PROM / Strength AROM;PROM;Strength      Palpation   Palpation comment Moderate fascial restrictions noted in the left upper arm, upper trapeziue, and scapularis region      AROM   Overall AROM Comments Assessed seated. IR/er adducted    AROM Assessment Site Shoulder    Right/Left Shoulder Left    Left Shoulder Flexion 70 Degrees    Left Shoulder ABduction 71 Degrees    Left Shoulder Internal Rotation 90 Degrees    Left Shoulder External Rotation -15 Degrees      PROM   PROM Assessment Site Shoulder    Right/Left Shoulder Left    Left Shoulder Flexion 93 Degrees    Left Shoulder ABduction 76 Degrees    Left Shoulder Internal Rotation 90 Degrees    Left Shoulder External Rotation 0 Degrees      Strength   Overall Strength Comments Assessed seated. IR/er adducted    Strength Assessment Site Shoulder    Right/Left Shoulder Left    Left Shoulder Flexion 3-/5    Left Shoulder ABduction 3-/5    Left Shoulder Internal Rotation 3/5    Left Shoulder External Rotation 3-/5                              OT Education - 06/19/21 1425     Education Details table slides    Person(s) Educated Patient    Methods Explanation;Demonstration;Verbal cues;Handout    Comprehension Returned demonstration;Verbalized understanding              OT Short Term Goals - 06/19/21 2028       OT SHORT TERM GOAL #1   Title Pt will be educated and independent  with HEP in order to faciliate her progress in therapy and return to using her LUE for 75% or more of daily  tasks. .    Time 3    Period Weeks    Status New    Target Date 07/10/21      OT  SHORT TERM GOAL #2   Title Pt will report a pain score in her LUE of approximately 4/10 when using her left arm to complete shoulder level reaching tasks.    Time 3    Period Weeks    Status New      OT SHORT TERM GOAL #3   Title Pt will decrease LUE fascial restrictions to minimal amount in order to increase the functional mobility need to complete shoulder level reaching tasks with less difficulty.    Time 3    Period Weeks    Status New      OT SHORT TERM GOAL #4   Title Pt will increase LUE A/ROM to [facility] to increase ability to donn shirts without compensatory strategies.    Time 3    Period Weeks    Status New      OT SHORT TERM GOAL #5   Title Pt will increase strength to 3/5 to increase ability wash hair using BUE.     Time 3    Period Weeks    Status New               OT Long Term Goals - 06/19/21 2032       OT LONG TERM GOAL #1   Title Pt will report a pain score of 2/10 or less when using her LUE for daily tasks such as dressing and bathing.    Time 6    Period Weeks    Status New    Target Date 07/31/21      OT LONG TERM GOAL #2   Title Pt will increase her LUE strength to 4-/5 in order to return to managing and lifting cookng items of moderate weight.    Time 6    Period Weeks    Status New                   Plan - 06/19/21 2021     Clinical Impression Statement A: Pt is a 86 y/o female S/P left proximal humerus fracture causing increased pain, fascial restrictions, and decreased ROM and strength resulting in severe difficulty completely daily tasks using her LUE as her non dominant extremity.    OT Occupational Profile and History Problem Focused Assessment - Including review of records relating to presenting problem    Occupational performance deficits (Please refer to evaluation for details): ADL's;IADL's;Leisure    Body Structure / Function / Physical Skills ADL;Strength;Pain;UE functional use;ROM;Fascial restriction;Mobility    Rehab  Potential Good    Clinical Decision Making Limited treatment options, no task modification necessary    Comorbidities Affecting Occupational Performance: Presence of comorbidities impacting occupational performance    Comorbidities impacting occupational performance description: history of previous left proximal humerus fracture    Modification or Assistance to Complete Evaluation  No modification of tasks or assist necessary to complete eval    OT Frequency 2x / week    OT Duration 6 weeks    OT Treatment/Interventions Self-care/ADL training;Moist Heat;Therapeutic activities;Ultrasound;Therapeutic exercise;Cryotherapy;Neuromuscular education;Passive range of motion;Patient/family education;Manual Therapy;Electrical Stimulation    Plan P: Pt will benefit from skilled OT services to increase her ability to utilize her LUE to complete ADL tasks. Treatment Plan: myofascial release, AA/ROM, A/ROM, general strengthening. Modalities PRN.    OT Home Exercise Plan eval: table slides  Consulted and Agree with Plan of Care Patient             Patient will benefit from skilled therapeutic intervention in order to improve the following deficits and impairments:   Body Structure / Function / Physical Skills: ADL, Strength, Pain, UE functional use, ROM, Fascial restriction, Mobility       Visit Diagnosis: Other symptoms and signs involving the musculoskeletal system - Plan: Ot plan of care cert/re-cert  Stiffness of left shoulder, not elsewhere classified - Plan: Ot plan of care cert/re-cert  Acute pain of left shoulder - Plan: Ot plan of care cert/re-cert    Problem List Patient Active Problem List   Diagnosis Date Noted   Near syncope 02/08/2020   Pre-syncope 02/07/2020   HTN (hypertension) 02/07/2020   Hx of colonic polyps 11/11/2018   Family hx of colon cancer 11/11/2018    Ailene Ravel, OTR/L,CBIS  [phone]  06/19/2021, 8:44 PM  [place], [place], [address] Phone: [phone]   Fax:  [phone]  Name: Carol Gutierrez MRN: [id number] Date of Birth: 1935-06-28

## 2021-06-19 NOTE — Patient Instructions (Signed)
1) SHOULDER: Flexion On Table   Place hands on towel placed on table, elbows straight. Lean forward with you upper body, pushing towel away from body.  _10-15__ reps per set, _1-2__ sets per day  2) Abduction (Passive)   With arm out to side, resting on towel placed on table with palm DOWN, keeping trunk away from table, lean to the side while pushing towel away from body.  Repeat __10-15__ times. Do ___1-2_ sessions per day.  Copyright  VHI. All rights reserved.     3) Internal Rotation (Assistive)   Seated with elbow bent at right angle and held against side, slide arm on table surface in an inward arc keeping elbow anchored in place. Repeat __10-15__ times. Do _1-2___ sessions per day. Activity: Use this motion to brush crumbs off the table.  Copyright  VHI. All rights reserved.

## 2021-06-20 ENCOUNTER — Ambulatory Visit (HOSPITAL_COMMUNITY): Payer: Medicare Other

## 2021-06-20 ENCOUNTER — Encounter (HOSPITAL_COMMUNITY): Payer: Self-pay

## 2021-06-20 DIAGNOSIS — R29898 Other symptoms and signs involving the musculoskeletal system: Secondary | ICD-10-CM | POA: Diagnosis not present

## 2021-06-20 DIAGNOSIS — M25512 Pain in left shoulder: Secondary | ICD-10-CM | POA: Diagnosis not present

## 2021-06-20 DIAGNOSIS — Z8673 Personal history of transient ischemic attack (TIA), and cerebral infarction without residual deficits: Secondary | ICD-10-CM | POA: Diagnosis not present

## 2021-06-20 DIAGNOSIS — Z780 Asymptomatic menopausal state: Secondary | ICD-10-CM | POA: Diagnosis not present

## 2021-06-20 DIAGNOSIS — M25612 Stiffness of left shoulder, not elsewhere classified: Secondary | ICD-10-CM | POA: Diagnosis not present

## 2021-06-20 DIAGNOSIS — B961 Klebsiella pneumoniae [K. pneumoniae] as the cause of diseases classified elsewhere: Secondary | ICD-10-CM | POA: Diagnosis not present

## 2021-06-20 DIAGNOSIS — N3946 Mixed incontinence: Secondary | ICD-10-CM | POA: Diagnosis not present

## 2021-06-20 DIAGNOSIS — N390 Urinary tract infection, site not specified: Secondary | ICD-10-CM | POA: Diagnosis not present

## 2021-06-20 DIAGNOSIS — L900 Lichen sclerosus et atrophicus: Secondary | ICD-10-CM | POA: Diagnosis not present

## 2021-06-20 DIAGNOSIS — Z01818 Encounter for other preprocedural examination: Secondary | ICD-10-CM | POA: Diagnosis not present

## 2021-06-20 DIAGNOSIS — I10 Essential (primary) hypertension: Secondary | ICD-10-CM | POA: Diagnosis not present

## 2021-06-20 NOTE — Therapy (Signed)
[place], [place], [address] Phone: [phone]   Fax:  [phone]  Occupational Therapy Treatment  Patient Details  Name: Carol Gutierrez MRN: [id number] Date of Birth: 11/02/1935 Referring Provider (OT): Rosiland Oz, MD   Encounter Date: 06/20/2021   OT End of Session - 06/20/21 0933     Visit Number 2    Number of Visits 12    Date for OT Re-Evaluation 07/31/21    Authorization Type 1) Medicare 2) AARP    Progress Note Due on Visit 10    OT Start Time 0900    Activity Tolerance Patient tolerated treatment well    Behavior During Therapy [facility] for tasks assessed/performed             Past Medical History:  Diagnosis Date   Arthritis    hands and back   Hypertension     Past Surgical History:  Procedure Laterality Date   COLONOSCOPY N/A 09/08/2012   Procedure: COLONOSCOPY;  Surgeon: Rogene Houston, MD;  [facility];  Service: Endoscopy;  Laterality: N/A;  730-moved to 830 Ann to notify pt   COLONOSCOPY N/A 01/05/2019   Procedure: COLONOSCOPY;  Surgeon: Rogene Houston, MD;  [facility];  Service: Endoscopy;  Laterality: N/A;  100   JOINT REPLACEMENT  2008   left hip   POLYPECTOMY  01/05/2019   Procedure: POLYPECTOMY;  Surgeon: Rogene Houston, MD;  [facility];  Service: Endoscopy;;  colon   TOTAL HIP ARTHROPLASTY Left Left Hip Replacement    There were no vitals filed for this visit.   Subjective Assessment - 06/20/21 0907     Subjective  S: I didn't get a chance to try the exercises yet.    Currently in Pain? No/denies                [facility] OT Assessment - 06/20/21 0907       Assessment   Medical Diagnosis S/P left proximal humerus fracture      Precautions   Precautions Shoulder    Precaution Comments A/ROM and gentle stretching as tolerated                      OT Treatments/Exercises (OP) - 06/20/21 0922        Exercises   Exercises Shoulder      Shoulder Exercises: Supine   Protraction PROM;5 reps    Horizontal ABduction PROM;5 reps    External Rotation PROM;5 reps    Internal Rotation PROM;5 reps    Flexion PROM;5 reps    ABduction PROM;5 reps      Shoulder Exercises: Seated   Extension AROM;10 reps    Row AROM;10 reps      Shoulder Exercises: Therapy Ball   Flexion 10 reps   2" hold at end stretch   ABduction 10 reps   2" hold at end stretch     Shoulder Exercises: ROM/Strengthening   Thumb Tacks 1' low level      Manual Therapy   Manual Therapy Myofascial release    Manual therapy comments completed seperately from all other skilled interventions, completed at beginning of session    Myofascial Release Myofascial release and manual stretching completed to left upper arm, upper trapezius, and scapularis region.                      OT Short Term Goals -  06/20/21 0932       OT SHORT TERM GOAL #1   Title Pt will be educated and independent  with HEP in order to faciliate her progress in therapy and return to using her LUE for 75% or more of daily tasks. .    Time 3    Period Weeks    Status On-going    Target Date 07/10/21      OT SHORT TERM GOAL #2   Title Pt will report a pain score in her LUE of approximately 4/10 when using her left arm to complete shoulder level reaching tasks.    Time 3    Period Weeks    Status On-going      OT SHORT TERM GOAL #3   Title Pt will decrease LUE fascial restrictions to minimal amount in order to increase the functional mobility need to complete shoulder level reaching tasks with less difficulty.    Time 3    Period Weeks    Status On-going      OT SHORT TERM GOAL #4   Title Pt will increase LUE A/ROM to [facility] to increase ability to donn shirts without compensatory strategies.    Time 3    Period Weeks    Status On-going      OT SHORT TERM GOAL #5   Title Pt will increase strength to 3/5 to increase ability wash hair  using BUE.     Time 3    Period Weeks    Status On-going               OT Long Term Goals - 06/20/21 0932       OT LONG TERM GOAL #1   Title Pt will report a pain score of 2/10 or less when using her LUE for daily tasks such as dressing and bathing.    Time 6    Period Weeks    Status On-going    Target Date 07/31/21      OT LONG TERM GOAL #2   Title Pt will increase her LUE strength to 4-/5 in order to return to managing and lifting cookng items of moderate weight.    Time 6    Period Weeks    Status On-going                   Plan - 06/20/21 0933     Clinical Impression Statement A: Initiated myofascial release to left upper arm. Completed ball stretches and pulleys within pain tolerance. Able to achieve approximately 50% range for shoulder flexion and abduction. VC for form and technique were provided.    Body Structure / Function / Physical Skills ADL;Strength;Pain;UE functional use;ROM;Fascial restriction;Mobility    Plan P: Continue with gentle stretching while increasing P/ROM within pain tolerance.    Consulted and Agree with Plan of Care Patient             Patient will benefit from skilled therapeutic intervention in order to improve the following deficits and impairments:   Body Structure / Function / Physical Skills: ADL, Strength, Pain, UE functional use, ROM, Fascial restriction, Mobility       Visit Diagnosis: Stiffness of left shoulder, not elsewhere classified  Other symptoms and signs involving the musculoskeletal system  Acute pain of left shoulder    Problem List Patient Active Problem List   Diagnosis Date Noted   Near syncope 02/08/2020   Pre-syncope 02/07/2020   HTN (hypertension) 02/07/2020   Hx of colonic  polyps 11/11/2018   Family hx of colon cancer 11/11/2018    Ailene Ravel, OTR/L,CBIS  [phone]  06/20/2021, 9:47 AM  [place], [place], [address] Phone: [phone]   Fax:  [phone]  Name: Carol Gutierrez MRN: [id number] Date of Birth: 1935-11-06

## 2021-06-25 ENCOUNTER — Encounter (HOSPITAL_COMMUNITY): Payer: Self-pay

## 2021-06-25 ENCOUNTER — Other Ambulatory Visit: Payer: Self-pay

## 2021-06-25 ENCOUNTER — Ambulatory Visit (HOSPITAL_COMMUNITY): Payer: Medicare Other

## 2021-06-25 DIAGNOSIS — M25612 Stiffness of left shoulder, not elsewhere classified: Secondary | ICD-10-CM

## 2021-06-25 DIAGNOSIS — M25512 Pain in left shoulder: Secondary | ICD-10-CM | POA: Diagnosis not present

## 2021-06-25 DIAGNOSIS — Z20822 Contact with and (suspected) exposure to covid-19: Secondary | ICD-10-CM | POA: Diagnosis not present

## 2021-06-25 DIAGNOSIS — R29898 Other symptoms and signs involving the musculoskeletal system: Secondary | ICD-10-CM | POA: Diagnosis not present

## 2021-06-25 NOTE — Therapy (Addendum)
[place], [place], [address] Phone: [phone]   Fax:  [phone]  Occupational Therapy Treatment  Patient Details  Name: Carol Gutierrez MRN: [id number] Date of Birth: [birth year] Referring Provider (OT): Rosiland Oz, MD   Encounter Date: 06/25/2021   OT End of Session - 06/25/21 1323     Visit Number 3    Number of Visits 12    Date for OT Re-Evaluation 07/31/21    Authorization Type 1) Medicare 2) AARP    Progress Note Due on Visit 10    OT Start Time 1030    OT Stop Time 1108    OT Time Calculation (min) 38 min    Activity Tolerance Patient tolerated treatment well    Behavior During Therapy [facility] for tasks assessed/performed             Past Medical History:  Diagnosis Date   Arthritis    hands and back   Hypertension     Past Surgical History:  Procedure Laterality Date   COLONOSCOPY N/A 09/08/2012   Procedure: COLONOSCOPY;  Surgeon: Rogene Houston, MD;  [facility];  Service: Endoscopy;  Laterality: N/A;  730-moved to 830 Ann to notify pt   COLONOSCOPY N/A 01/05/2019   Procedure: COLONOSCOPY;  Surgeon: Rogene Houston, MD;  [facility];  Service: Endoscopy;  Laterality: N/A;  100   JOINT REPLACEMENT  2008   left hip   POLYPECTOMY  01/05/2019   Procedure: POLYPECTOMY;  Surgeon: Rogene Houston, MD;  [facility];  Service: Endoscopy;;  colon   TOTAL HIP ARTHROPLASTY Left Left Hip Replacement    There were no vitals filed for this visit.   Subjective Assessment - 06/25/21 1036     Subjective  S: I wasn't able to do the exercises like I should have. I have had company over but I'm going to do them.    Currently in Pain? No/denies                [facility] OT Assessment - 06/25/21 1050       Assessment   Medical Diagnosis S/P left proximal humerus fracture      Precautions   Precautions Shoulder    Precaution Comments A/ROM and gentle  stretching as tolerated                      OT Treatments/Exercises (OP) - 06/25/21 1050       Exercises   Exercises Shoulder      Shoulder Exercises: Supine   Protraction PROM;5 reps;AROM;10 reps    Horizontal ABduction PROM;5 reps;AROM;10 reps;Other (comment)   Completed unilateral while turning head in opposite direction while arm completes horizontal abduction   External Rotation PROM;5 reps;AROM;10 reps    Internal Rotation PROM;5 reps;AROM;10 reps    Flexion PROM;5 reps;AROM;10 reps    ABduction PROM;5 reps      Shoulder Exercises: ROM/Strengthening   Wall Wash 1' flexion to chest level. 1' side to side (chest level)      Manual Therapy   Manual Therapy Myofascial release;Muscle Energy Technique    Manual therapy comments completed seperately from all other skilled interventions, completed at beginning of session    Myofascial Release Myofascial release and manual stretching completed to left upper arm, upper trapezius, and scapularis region.    Muscle Energy Technique Muscle energy technique completed external rotators and shoulder flexors to relax tone  and muscle spasm and improve range of motion.                      OT Short Term Goals - 06/20/21 0932       OT SHORT TERM GOAL #1   Title Pt will be educated and independent  with HEP in order to faciliate her progress in therapy and return to using her LUE for 75% or more of daily tasks. .    Time 3    Period Weeks    Status On-going    Target Date 07/10/21      OT SHORT TERM GOAL #2   Title Pt will report a pain score in her LUE of approximately 4/10 when using her left arm to complete shoulder level reaching tasks.    Time 3    Period Weeks    Status On-going      OT SHORT TERM GOAL #3   Title Pt will decrease LUE fascial restrictions to minimal amount in order to increase the functional mobility need to complete shoulder level reaching tasks with less difficulty.    Time 3    Period  Weeks    Status On-going      OT SHORT TERM GOAL #4   Title Pt will increase LUE A/ROM to [facility] to increase ability to donn shirts without compensatory strategies.    Time 3    Period Weeks    Status On-going      OT SHORT TERM GOAL #5   Title Pt will increase strength to 3/5 to increase ability wash hair using BUE.     Time 3    Period Weeks    Status On-going               OT Long Term Goals - 06/20/21 0932       OT LONG TERM GOAL #1   Title Pt will report a pain score of 2/10 or less when using her LUE for daily tasks such as dressing and bathing.    Time 6    Period Weeks    Status On-going    Target Date 07/31/21      OT LONG TERM GOAL #2   Title Pt will increase her LUE strength to 4-/5 in order to return to managing and lifting cookng items of moderate weight.    Time 6    Period Weeks    Status On-going                   Plan - 06/25/21 1331     Clinical Impression Statement A: Completed myofascial release to address fascial restrictions located in the left upper arm and upper trapezius region. Completed A/ROM supine with muscle energy technique to help increase ROM. Slightly more external rotation achieved passively. VC for form and technique were provided. While standing, patient would demonstrate shoulder elevation although did attempt to correct when VC were provided.    Body Structure / Function / Physical Skills ADL;Strength;Pain;UE functional use;ROM;Fascial restriction;Mobility    Plan P: Complete FOTO. Update HEP for either supine A/ROM or standing AA/ROM. Attempt standing AA/ROM. PVC pipe slide.    Consulted and Agree with Plan of Care Patient             Patient will benefit from skilled therapeutic intervention in order to improve the following deficits and impairments:   Body Structure / Function / Physical Skills: ADL, Strength, Pain, UE functional use, ROM, Fascial  restriction, Mobility       Visit Diagnosis: Stiffness of left  shoulder, not elsewhere classified  Acute pain of left shoulder  Other symptoms and signs involving the musculoskeletal system    Problem List Patient Active Problem List   Diagnosis Date Noted   Near syncope 02/08/2020   Pre-syncope 02/07/2020   HTN (hypertension) 02/07/2020   Hx of colonic polyps 11/11/2018   Family hx of colon cancer 11/11/2018    Ailene Ravel, OTR/L,CBIS  [phone]  06/25/2021, 1:36 PM  [place], [place], [address] Phone: [phone]   Fax:  [phone]  Name: NILEY HELBIG MRN: [id number] Date of Birth: [birth year]

## 2021-06-26 ENCOUNTER — Ambulatory Visit (HOSPITAL_COMMUNITY): Payer: Medicare Other | Admitting: Occupational Therapy

## 2021-06-26 ENCOUNTER — Encounter (HOSPITAL_COMMUNITY): Payer: Self-pay | Admitting: Occupational Therapy

## 2021-06-26 DIAGNOSIS — M25612 Stiffness of left shoulder, not elsewhere classified: Secondary | ICD-10-CM

## 2021-06-26 DIAGNOSIS — M25512 Pain in left shoulder: Secondary | ICD-10-CM

## 2021-06-26 DIAGNOSIS — R29898 Other symptoms and signs involving the musculoskeletal system: Secondary | ICD-10-CM | POA: Diagnosis not present

## 2021-06-26 NOTE — Therapy (Signed)
[place], [place], [address] Phone: [phone]   Fax:  [phone]  Occupational Therapy Treatment  Patient Details  Name: Carol Gutierrez MRN: [id number] Date of Birth: 1935/08/14 Referring Provider (OT): Rosiland Oz, MD   Encounter Date: 06/26/2021   OT End of Session - 06/26/21 1402     Visit Number 4    Number of Visits 12    Date for OT Re-Evaluation 07/31/21    Authorization Type 1) Medicare 2) A[employer]    Progress Note Due on Visit 10    OT Start Time 1305    OT Stop Time 1343    OT Time Calculation (min) 38 min    Activity Tolerance Patient tolerated treatment well    Behavior During Therapy [facility] for tasks assessed/performed             Past Medical History:  Diagnosis Date   Arthritis    hands and back   Hypertension     Past Surgical History:  Procedure Laterality Date   COLONOSCOPY N/A 09/08/2012   Procedure: COLONOSCOPY;  Surgeon: Rogene Houston, MD;  [facility];  Service: Endoscopy;  Laterality: N/A;  730-moved to 830 Ann to notify pt   COLONOSCOPY N/A 01/05/2019   Procedure: COLONOSCOPY;  Surgeon: Rogene Houston, MD;  [facility];  Service: Endoscopy;  Laterality: N/A;  100   JOINT REPLACEMENT  2008   left hip   POLYPECTOMY  01/05/2019   Procedure: POLYPECTOMY;  Surgeon: Rogene Houston, MD;  [facility];  Service: Endoscopy;;  colon   TOTAL HIP ARTHROPLASTY Left Left Hip Replacement    There were no vitals filed for this visit.   Subjective Assessment - 06/26/21 1306     Subjective  S: I had some pain in the middle of the night but it stopped when I got up.    Currently in Pain? No/denies                [facility] OT Assessment - 06/26/21 0001       Assessment   Medical Diagnosis S/P left proximal humerus fracture      Precautions   Precautions Shoulder    Precaution Comments A/ROM and gentle stretching as tolerated       Observation/Other Assessments   Focus on Therapeutic Outcomes (FOTO)  44/100                      OT Treatments/Exercises (OP) - 06/26/21 1307       Exercises   Exercises Shoulder      Shoulder Exercises: Supine   Protraction PROM;5 reps;AROM;10 reps    Horizontal ABduction PROM;5 reps;AROM;10 reps;Other (comment)    External Rotation PROM;5 reps;AROM;10 reps    Internal Rotation PROM;5 reps;AROM;10 reps    Flexion PROM;5 reps;AROM;10 reps    ABduction PROM;5 reps      Shoulder Exercises: Standing   Protraction AAROM;10 reps    Horizontal ABduction AAROM;10 reps    External Rotation AAROM;10 reps    Internal Rotation AAROM;10 reps    Flexion AAROM;10 reps    ABduction AAROM;10 reps      Shoulder Exercises: Pulleys   Flexion 1 minute    ABduction 1 minute      Shoulder Exercises: ROM/Strengthening   Wall Wash 1' flexion to chest level    Other ROM/Strengthening Exercises PVC pipe slide, 10X flexion  Manual Therapy   Manual Therapy Myofascial release;Muscle Energy Technique    Manual therapy comments completed seperately from all other skilled interventions, completed at beginning of session    Myofascial Release Myofascial release and manual stretching completed to left upper arm, upper trapezius, and scapularis region.    Muscle Energy Technique Muscle energy technique completed external rotators and shoulder flexors to relax tone and muscle spasm and improve range of motion.                    OT Education - 06/26/21 1329     Education Details standing AA/ROM    Person(s) Educated Patient    Methods Explanation;Demonstration;Verbal cues;Handout    Comprehension Returned demonstration;Verbalized understanding              OT Short Term Goals - 06/20/21 0932       OT SHORT TERM GOAL #1   Title Pt will be educated and independent  with HEP in order to faciliate her progress in therapy and return to using her LUE for 75% or more of  daily tasks. .    Time 3    Period Weeks    Status On-going    Target Date 07/10/21      OT SHORT TERM GOAL #2   Title Pt will report a pain score in her LUE of approximately 4/10 when using her left arm to complete shoulder level reaching tasks.    Time 3    Period Weeks    Status On-going      OT SHORT TERM GOAL #3   Title Pt will decrease LUE fascial restrictions to minimal amount in order to increase the functional mobility need to complete shoulder level reaching tasks with less difficulty.    Time 3    Period Weeks    Status On-going      OT SHORT TERM GOAL #4   Title Pt will increase LUE A/ROM to [facility] to increase ability to donn shirts without compensatory strategies.    Time 3    Period Weeks    Status On-going      OT SHORT TERM GOAL #5   Title Pt will increase strength to 3/5 to increase ability wash hair using BUE.     Time 3    Period Weeks    Status On-going               OT Long Term Goals - 06/20/21 0932       OT LONG TERM GOAL #1   Title Pt will report a pain score of 2/10 or less when using her LUE for daily tasks such as dressing and bathing.    Time 6    Period Weeks    Status On-going    Target Date 07/31/21      OT LONG TERM GOAL #2   Title Pt will increase her LUE strength to 4-/5 in order to return to managing and lifting cookng items of moderate weight.    Time 6    Period Weeks    Status On-going                   Plan - 06/26/21 1402     Clinical Impression Statement A: Continued with myofascial release to address fascial restrictions. Passive stretching completed with pt demonstrating tight end range in all planes. Added AA/ROM in standing, pvc pipe slide, and pulleys. Verbal cuing for form and technique, HEP updated for AA/ROM in  standing.    Body Structure / Function / Physical Skills ADL;Strength;Pain;UE functional use;ROM;Fascial restriction;Mobility    Plan P: Follow up on HEP, continue with manual techniques and ROM  work    OT Home Exercise Plan eval: table slides; 2/22: AA/ROM standing    Consulted and Agree with Plan of Care Patient             Patient will benefit from skilled therapeutic intervention in order to improve the following deficits and impairments:   Body Structure / Function / Physical Skills: ADL, Strength, Pain, UE functional use, ROM, Fascial restriction, Mobility       Visit Diagnosis: Stiffness of left shoulder, not elsewhere classified  Acute pain of left shoulder  Other symptoms and signs involving the musculoskeletal system    Problem List Patient Active Problem List   Diagnosis Date Noted   Near syncope 02/08/2020   Pre-syncope 02/07/2020   HTN (hypertension) 02/07/2020   Hx of colonic polyps 11/11/2018   Family hx of colon cancer 11/11/2018    [name], OTR/L  [phone] 06/26/2021, 2:05 PM  [place] [address] [place], [place], [address] Phone: [phone]   Fax:  [phone]  Name: Carol Gutierrez MRN: [id number] Date of Birth: Mar 19, 1936

## 2021-06-26 NOTE — Patient Instructions (Signed)

## 2021-06-27 DIAGNOSIS — S82142A Displaced bicondylar fracture of left tibia, initial encounter for closed fracture: Secondary | ICD-10-CM | POA: Diagnosis not present

## 2021-06-27 DIAGNOSIS — Z8673 Personal history of transient ischemic attack (TIA), and cerebral infarction without residual deficits: Secondary | ICD-10-CM | POA: Diagnosis not present

## 2021-06-27 DIAGNOSIS — S32512D Fracture of superior rim of left pubis, subsequent encounter for fracture with routine healing: Secondary | ICD-10-CM | POA: Diagnosis not present

## 2021-06-27 DIAGNOSIS — Z9181 History of falling: Secondary | ICD-10-CM | POA: Diagnosis not present

## 2021-06-27 DIAGNOSIS — I63511 Cerebral infarction due to unspecified occlusion or stenosis of right middle cerebral artery: Secondary | ICD-10-CM | POA: Diagnosis not present

## 2021-06-27 DIAGNOSIS — N3946 Mixed incontinence: Secondary | ICD-10-CM | POA: Diagnosis not present

## 2021-06-27 DIAGNOSIS — S42202P Unspecified fracture of upper end of left humerus, subsequent encounter for fracture with malunion: Secondary | ICD-10-CM | POA: Diagnosis not present

## 2021-06-27 DIAGNOSIS — Z8781 Personal history of (healed) traumatic fracture: Secondary | ICD-10-CM | POA: Diagnosis not present

## 2021-06-27 DIAGNOSIS — M810 Age-related osteoporosis without current pathological fracture: Secondary | ICD-10-CM | POA: Diagnosis not present

## 2021-07-01 DIAGNOSIS — M19112 Post-traumatic osteoarthritis, left shoulder: Secondary | ICD-10-CM | POA: Diagnosis not present

## 2021-07-01 DIAGNOSIS — Z8781 Personal history of (healed) traumatic fracture: Secondary | ICD-10-CM | POA: Diagnosis not present

## 2021-07-01 DIAGNOSIS — M66322 Spontaneous rupture of flexor tendons, left upper arm: Secondary | ICD-10-CM | POA: Diagnosis not present

## 2021-07-01 DIAGNOSIS — M67912 Unspecified disorder of synovium and tendon, left shoulder: Secondary | ICD-10-CM | POA: Diagnosis not present

## 2021-07-02 ENCOUNTER — Encounter (HOSPITAL_COMMUNITY): Payer: Self-pay

## 2021-07-02 ENCOUNTER — Ambulatory Visit (HOSPITAL_COMMUNITY): Payer: Medicare Other

## 2021-07-02 ENCOUNTER — Other Ambulatory Visit: Payer: Self-pay

## 2021-07-02 DIAGNOSIS — Z01818 Encounter for other preprocedural examination: Secondary | ICD-10-CM | POA: Diagnosis not present

## 2021-07-02 DIAGNOSIS — M25612 Stiffness of left shoulder, not elsewhere classified: Secondary | ICD-10-CM | POA: Diagnosis not present

## 2021-07-02 DIAGNOSIS — M25512 Pain in left shoulder: Secondary | ICD-10-CM

## 2021-07-02 DIAGNOSIS — E785 Hyperlipidemia, unspecified: Secondary | ICD-10-CM | POA: Diagnosis not present

## 2021-07-02 DIAGNOSIS — Z0001 Encounter for general adult medical examination with abnormal findings: Secondary | ICD-10-CM | POA: Diagnosis not present

## 2021-07-02 DIAGNOSIS — R29898 Other symptoms and signs involving the musculoskeletal system: Secondary | ICD-10-CM

## 2021-07-02 DIAGNOSIS — I10 Essential (primary) hypertension: Secondary | ICD-10-CM | POA: Diagnosis not present

## 2021-07-02 NOTE — Therapy (Signed)
[place], [place], [address] Phone: [phone]   Fax:  [phone]  Occupational Therapy Treatment  Patient Details  Name: Carol Gutierrez MRN: [id number] Date of Birth: 29-Jul-1935 Referring Provider (OT): Rosiland Oz, MD   Encounter Date: 07/02/2021   OT End of Session - 07/02/21 1044     Visit Number 5    Number of Visits 12    Date for OT Re-Evaluation 07/31/21    Authorization Type 1) Medicare 2) AARP    Progress Note Due on Visit 10    OT Start Time 1038   pt arrived late   OT Stop Time 1113    OT Time Calculation (min) 35 min    Activity Tolerance Patient tolerated treatment well    Behavior During Therapy [facility] for tasks assessed/performed             Past Medical History:  Diagnosis Date   Arthritis    hands and back   Hypertension     Past Surgical History:  Procedure Laterality Date   COLONOSCOPY N/A 09/08/2012   Procedure: COLONOSCOPY;  Surgeon: Rogene Houston, MD;  [facility];  Service: Endoscopy;  Laterality: N/A;  730-moved to 830 Ann to notify pt   COLONOSCOPY N/A 01/05/2019   Procedure: COLONOSCOPY;  Surgeon: Rogene Houston, MD;  [facility];  Service: Endoscopy;  Laterality: N/A;  100   JOINT REPLACEMENT  2008   left hip   POLYPECTOMY  01/05/2019   Procedure: POLYPECTOMY;  Surgeon: Rogene Houston, MD;  [facility];  Service: Endoscopy;;  colon   TOTAL HIP ARTHROPLASTY Left Left Hip Replacement    There were no vitals filed for this visit.   Subjective Assessment - 07/02/21 1056     Subjective  S: Well I saw the orthopedic Doctor and I'm having shoulder replacement surgery on April 5th.    Currently in Pain? No/denies                [facility] OT Assessment - 07/02/21 1057       Assessment   Medical Diagnosis S/P left proximal humerus fracture      Precautions   Precautions Shoulder    Precaution Comments A/ROM and  gentle stretching as tolerated                      OT Treatments/Exercises (OP) - 07/02/21 1054       Exercises   Exercises Shoulder      Shoulder Exercises: Supine   Protraction PROM;5 reps    Horizontal ABduction PROM;5 reps;AROM;10 reps;Other (comment)    External Rotation PROM;5 reps    Internal Rotation PROM;5 reps    Flexion PROM;5 reps;AROM;10 reps    ABduction PROM;5 reps      Shoulder Exercises: Standing   Protraction AROM;10 reps    Horizontal ABduction AAROM;10 reps    External Rotation AAROM;10 reps    Internal Rotation AAROM;10 reps    Flexion AAROM;10 reps    ABduction AAROM;10 reps      Shoulder Exercises: Pulleys   Flexion 1 minute   standing   ABduction 1 minute   standing     Shoulder Exercises: ROM/Strengthening   Wall Wash 1' flexion to chest level      Manual Therapy   Manual Therapy Myofascial release    Manual therapy comments completed seperately from all other skilled interventions, completed at  beginning of session    Myofascial Release Myofascial release and manual stretching completed to left upper arm, upper trapezius, and scapularis region.                      OT Short Term Goals - 06/20/21 0932       OT SHORT TERM GOAL #1   Title Pt will be educated and independent  with HEP in order to faciliate her progress in therapy and return to using her LUE for 75% or more of daily tasks. .    Time 3    Period Weeks    Status On-going    Target Date 07/10/21      OT SHORT TERM GOAL #2   Title Pt will report a pain score in her LUE of approximately 4/10 when using her left arm to complete shoulder level reaching tasks.    Time 3    Period Weeks    Status On-going      OT SHORT TERM GOAL #3   Title Pt will decrease LUE fascial restrictions to minimal amount in order to increase the functional mobility need to complete shoulder level reaching tasks with less difficulty.    Time 3    Period Weeks    Status On-going       OT SHORT TERM GOAL #4   Title Pt will increase LUE A/ROM to [facility] to increase ability to donn shirts without compensatory strategies.    Time 3    Period Weeks    Status On-going      OT SHORT TERM GOAL #5   Title Pt will increase strength to 3/5 to increase ability wash hair using BUE.     Time 3    Period Weeks    Status On-going               OT Long Term Goals - 06/20/21 0932       OT LONG TERM GOAL #1   Title Pt will report a pain score of 2/10 or less when using her LUE for daily tasks such as dressing and bathing.    Time 6    Period Weeks    Status On-going    Target Date 07/31/21      OT LONG TERM GOAL #2   Title Pt will increase her LUE strength to 4-/5 in order to return to managing and lifting cookng items of moderate weight.    Time 6    Period Weeks    Status On-going                   Plan - 07/02/21 1057     Clinical Impression Statement A: Discussed plan for upcoming surgery. Provided patient the option of completing just a HEP prior to surgery or continue with OT while working on increasing ROM as able. Patient is interested in continuing with therapy and once mini reassessment is completed on 07/17/21 we can assess if it's recommended to continue or discharge with a HEP until after surgery when she will return. Myofascial release completed to address restrictions in the left upper arm region. Used PVC pipe to complete AA/ROM when needed while standing. VC form and technique.    Body Structure / Function / Physical Skills ADL;Strength;Pain;UE functional use;ROM;Fascial restriction;Mobility    Plan P: Continue with myofascial release abd ROM work. Add UBE.    Consulted and Agree with Plan of Care Patient  Patient will benefit from skilled therapeutic intervention in order to improve the following deficits and impairments:   Body Structure / Function / Physical Skills: ADL, Strength, Pain, UE functional use, ROM, Fascial  restriction, Mobility       Visit Diagnosis: Acute pain of left shoulder  Stiffness of left shoulder, not elsewhere classified  Other symptoms and signs involving the musculoskeletal system    Problem List Patient Active Problem List   Diagnosis Date Noted   Near syncope 02/08/2020   Pre-syncope 02/07/2020   HTN (hypertension) 02/07/2020   Hx of colonic polyps 11/11/2018   Family hx of colon cancer 11/11/2018    Ailene Ravel, OTR/L,CBIS  [phone]  07/02/2021, 12:50 PM  [place], [place], [address] Phone: [phone]   Fax:  [phone]  Name: Carol Gutierrez MRN: [id number] Date of Birth: 05-16-1935

## 2021-07-04 ENCOUNTER — Ambulatory Visit (HOSPITAL_COMMUNITY): Payer: Medicare Other | Attending: Orthopedic Surgery

## 2021-07-04 ENCOUNTER — Other Ambulatory Visit: Payer: Self-pay

## 2021-07-04 ENCOUNTER — Encounter (HOSPITAL_COMMUNITY): Payer: Self-pay

## 2021-07-04 DIAGNOSIS — M25612 Stiffness of left shoulder, not elsewhere classified: Secondary | ICD-10-CM | POA: Diagnosis not present

## 2021-07-04 DIAGNOSIS — M25512 Pain in left shoulder: Secondary | ICD-10-CM | POA: Insufficient documentation

## 2021-07-04 DIAGNOSIS — R29898 Other symptoms and signs involving the musculoskeletal system: Secondary | ICD-10-CM | POA: Insufficient documentation

## 2021-07-04 NOTE — Therapy (Signed)
[place] [facility] ?[address] ?[place], [place], [address] ?Phone: [phone]   Fax:  [phone] ? ?Occupational Therapy Treatment ? ?Patient Details  ?Name: Carol Gutierrez ?MRN: [id number] ?Date of Birth: 09-06-1935 ?Referring Provider (OT): Rosiland Oz, MD ? ? ?Encounter Date: 07/04/2021 ? ? OT End of Session - 07/04/21 1011   ? ? Visit Number 6   ? Number of Visits 12   ? Date for OT Re-Evaluation 07/31/21   ? Authorization Type 1) Medicare 2) AARP   ? Progress Note Due on Visit 10   ? OT Start Time 0945   ? OT Stop Time 1023   ? OT Time Calculation (min) 38 min   ? Activity Tolerance Patient tolerated treatment well   ? Behavior During Therapy [facility] for tasks assessed/performed   ? ?  ?  ? ?  ? ? ?Past Medical History:  ?Diagnosis Date  ? Arthritis   ? hands and back  ? Hypertension   ? ? ?Past Surgical History:  ?Procedure Laterality Date  ? COLONOSCOPY N/A 09/08/2012  ? Procedure: COLONOSCOPY;  Surgeon: Rogene Houston, MD;  [facility];  Service: Endoscopy;  Laterality: N/A;  730-moved to 830 Ann to notify pt  ? COLONOSCOPY N/A 01/05/2019  ? Procedure: COLONOSCOPY;  Surgeon: Rogene Houston, MD;  [facility];  Service: Endoscopy;  Laterality: N/A;  100  ? JOINT REPLACEMENT  2008  ? left hip  ? POLYPECTOMY  01/05/2019  ? Procedure: POLYPECTOMY;  Surgeon: Rogene Houston, MD;  [facility];  Service: Endoscopy;;  colon  ? TOTAL HIP ARTHROPLASTY Left Left Hip Replacement  ? ? ?There were no vitals filed for this visit. ? ? Subjective Assessment - 07/04/21 1003   ? ? Subjective  S: I was able to do those exercises with the cane at home one time yesterday.   ? Currently in Pain? No/denies   ? ?  ?  ? ?  ? ? ? ? ? [facility] OT Assessment - 07/04/21 1004   ? ?  ? Assessment  ? Medical Diagnosis S/P left proximal humerus fracture   ?  ? Precautions  ? Precautions Shoulder   ? Precaution Comments A/ROM and gentle stretching as tolerated   ? ?   ?  ? ?  ? ? ? ? ? ? ? ? ? ? ? OT Treatments/Exercises (OP) - 07/04/21 0001   ? ?  ? Exercises  ? Exercises Shoulder   ?  ? Shoulder Exercises: Supine  ? Protraction PROM;5 reps;AROM;10 reps   ? Horizontal ABduction PROM;5 reps;AROM;10 reps   ? External Rotation PROM;5 reps   ? Internal Rotation PROM;5 reps   ? Flexion PROM;5 reps;AROM;10 reps   ? ABduction PROM;5 reps   ?  ? Shoulder Exercises: Standing  ? Protraction AROM;10 reps   ? Horizontal ABduction AAROM;10 reps   ? External Rotation AAROM;10 reps   ? Internal Rotation AAROM;10 reps   ? Flexion AAROM;10 reps   ? ABduction AAROM;10 reps   ?  ? Shoulder Exercises: ROM/Strengthening  ? UBE (Upper Arm Bike) level 1 2' forward 2' reverse   ? Rhythmic Stabilization, Supine A/ROM 10X   ? Rhythmic Stabilization, Seated A/ROM 10X   ?  ? Functional Reaching Activities  ? High Level Squigz placed on door focusing on functional reaching. Removed Barrington Ellison once all were placed.   ?  ? Manual Therapy  ? Manual  Therapy Myofascial release   ? Manual therapy comments completed seperately from all other skilled interventions, completed at beginning of session   ? Myofascial Release Myofascial release and manual stretching completed to left upper arm, upper trapezius, and scapularis region.   ? ?  ?  ? ?  ? ? ? ? ? ? ? ? ? ? ? OT Short Term Goals - 06/20/21 0932   ? ?  ? OT SHORT TERM GOAL #1  ? Title Pt will be educated and independent  with HEP in order to faciliate her progress in therapy and return to using her LUE for 75% or more of daily tasks. .   ? Time 3   ? Period Weeks   ? Status On-going   ? Target Date 07/10/21   ?  ? OT SHORT TERM GOAL #2  ? Title Pt will report a pain score in her LUE of approximately 4/10 when using her left arm to complete shoulder level reaching tasks.   ? Time 3   ? Period Weeks   ? Status On-going   ?  ? OT SHORT TERM GOAL #3  ? Title Pt will decrease LUE fascial restrictions to minimal amount in order to increase the functional mobility need  to complete shoulder level reaching tasks with less difficulty.   ? Time 3   ? Period Weeks   ? Status On-going   ?  ? OT SHORT TERM GOAL #4  ? Title Pt will increase LUE A/ROM to [facility] to increase ability to donn shirts without compensatory strategies.   ? Time 3   ? Period Weeks   ? Status On-going   ?  ? OT SHORT TERM GOAL #5  ? Title Pt will increase strength to 3/5 to increase ability wash hair using BUE.    ? Time 3   ? Period Weeks   ? Status On-going   ? ?  ?  ? ?  ? ? ? ? OT Long Term Goals - 06/20/21 0932   ? ?  ? OT LONG TERM GOAL #1  ? Title Pt will report a pain score of 2/10 or less when using her LUE for daily tasks such as dressing and bathing.   ? Time 6   ? Period Weeks   ? Status On-going   ? Target Date 07/31/21   ?  ? OT LONG TERM GOAL #2  ? Title Pt will increase her LUE strength to 4-/5 in order to return to managing and lifting cookng items of moderate weight.   ? Time 6   ? Period Weeks   ? Status On-going   ? ?  ?  ? ?  ? ? ? ? ? ? ? ? Plan - 07/04/21 1041   ? ? Clinical Impression Statement A: Myofascial release completed to address fascial restrictions in left shoulder. Added proximal shoulder strengthening supine and standing. VC for form and technique provided. UBE added at the end of session to work on strength and endurance.   ? Body Structure / Function / Physical Skills ADL;Strength;Pain;UE functional use;ROM;Fascial restriction;Mobility   ? Plan P: Resume muscle energy technique. Add shoulder flexion stretch.   ? Consulted and Agree with Plan of Care Patient   ? ?  ?  ? ?  ? ? ?Patient will benefit from skilled therapeutic intervention in order to improve the following deficits and impairments:   ?Body Structure / Function / Physical Skills: ADL, Strength, Pain, UE functional  use, ROM, Fascial restriction, Mobility ?  ?  ? ? ?Visit Diagnosis: ?Acute pain of left shoulder ? ?Other symptoms and signs involving the musculoskeletal system ? ?Stiffness of left shoulder, not elsewhere  classified ? ? ? ?Problem List ?Patient Active Problem List  ? Diagnosis Date Noted  ? Near syncope 02/08/2020  ? Pre-syncope 02/07/2020  ? HTN (hypertension) 02/07/2020  ? Hx of colonic polyps 11/11/2018  ? Family hx of colon cancer 11/11/2018  ? ? ?Ailene Ravel, OTR/L,CBIS  ?[phone] ? ?07/04/2021, 10:47 AM ? ?[place] ?[place] ?[address] ?[place], [place], [address] ?Phone: [phone]   Fax:  [phone] ? ?Name: Carol Gutierrez ?MRN: [id number] ?Date of Birth: 16-Jan-1936 ? ?

## 2021-07-09 ENCOUNTER — Encounter (HOSPITAL_COMMUNITY): Payer: Medicare Other

## 2021-07-10 ENCOUNTER — Ambulatory Visit (HOSPITAL_COMMUNITY): Payer: Medicare Other

## 2021-07-10 ENCOUNTER — Other Ambulatory Visit: Payer: Self-pay

## 2021-07-10 ENCOUNTER — Encounter (HOSPITAL_COMMUNITY): Payer: Self-pay

## 2021-07-10 DIAGNOSIS — R29898 Other symptoms and signs involving the musculoskeletal system: Secondary | ICD-10-CM | POA: Diagnosis not present

## 2021-07-10 DIAGNOSIS — M25612 Stiffness of left shoulder, not elsewhere classified: Secondary | ICD-10-CM | POA: Diagnosis not present

## 2021-07-10 DIAGNOSIS — M25512 Pain in left shoulder: Secondary | ICD-10-CM

## 2021-07-10 NOTE — Therapy (Signed)
[facility] ?[facility] ?[address] ?[place], [place], [address] ?Phone: [phone]   Fax:  [phone] ? ?Occupational Therapy Treatment ? ?Patient Details  ?Name: Carol Gutierrez ?MRN: [id number] ?Date of Birth: 02-07-1936 ?Referring Provider (OT): Rosiland Oz, MD ? ? ?Encounter Date: 07/10/2021 ? ? OT End of Session - 07/10/21 1026   ? ? Visit Number 7   ? Number of Visits 12   ? Date for OT Re-Evaluation 07/31/21   ? Authorization Type 1) Medicare 2) AARP   ? Progress Note Due on Visit 10   ? OT Start Time 0945   ? OT Stop Time 1023   ? OT Time Calculation (min) 38 min   ? Activity Tolerance Patient tolerated treatment well   ? Behavior During Therapy [facility] for tasks assessed/performed   ? ?  ?  ? ?  ? ? ?Past Medical History:  ?Diagnosis Date  ? Arthritis   ? hands and back  ? Hypertension   ? ? ?Past Surgical History:  ?Procedure Laterality Date  ? COLONOSCOPY N/A 09/08/2012  ? Procedure: COLONOSCOPY;  Surgeon: Rogene Houston, MD;  [facility];  Service: Endoscopy;  Laterality: N/A;  730-moved to 830 Ann to notify pt  ? COLONOSCOPY N/A 01/05/2019  ? Procedure: COLONOSCOPY;  Surgeon: Rogene Houston, MD;  [facility];  Service: Endoscopy;  Laterality: N/A;  100  ? JOINT REPLACEMENT  2008  ? left hip  ? POLYPECTOMY  01/05/2019  ? Procedure: POLYPECTOMY;  Surgeon: Rogene Houston, MD;  [facility];  Service: Endoscopy;;  colon  ? TOTAL HIP ARTHROPLASTY Left Left Hip Replacement  ? ? ?There were no vitals filed for this visit. ? ? Subjective Assessment - 07/10/21 0948   ? ? Currently in Pain? Yes   ? Pain Score 3    ? Pain Location Shoulder   ? Pain Orientation Left   ? Pain Descriptors / Indicators Sore;Constant   ? Pain Type Chronic pain   ? Pain Radiating Towards to back of shoulder and upper arm   ? Pain Onset More than a month ago   ? Pain Frequency Constant   ? Aggravating Factors  certain movements, lifting things   ? Pain Relieving  Factors not needing anything right now   ? Effect of Pain on Daily Activities min effect   ? ?  ?  ? ?  ? ? ? ? ? [facility] OT Assessment - 07/10/21 1025   ? ?  ? Assessment  ? Medical Diagnosis S/P left proximal humerus fracture   ?  ? Precautions  ? Precautions Shoulder   ? Precaution Comments A/ROM and gentle stretching as tolerated   ? ?  ?  ? ?  ? ? ? ? ? ? ? ? ? ? ? OT Treatments/Exercises (OP) - 07/10/21 1324   ? ?  ? Exercises  ? Exercises Shoulder   ?  ? Shoulder Exercises: Supine  ? Protraction PROM;5 reps;AROM;12 reps   ? Horizontal ABduction PROM;5 reps;AROM;12 reps   ? External Rotation PROM;5 reps   ? Internal Rotation PROM;5 reps   ? Flexion PROM;5 reps;AROM;12 reps   ? ABduction PROM;5 reps   ?  ? Shoulder Exercises: Standing  ? Protraction AROM;12 reps   ? Horizontal ABduction AAROM;12 reps   ? External Rotation AAROM;12 reps   ? Internal Rotation AAROM;12 reps   ? Flexion AAROM;12 reps   ? ABduction  AAROM;12 reps   ?  ? Shoulder Exercises: ROM/Strengthening  ? UBE (Upper Arm Bike) level 1 2' forward 2' reverse   pace: 5.5  ? Rhythmic Stabilization, Seated A/ROM 12X   ?  ? Shoulder Exercises: Stretch  ? Wall Stretch - Flexion 2 reps;20 seconds   ?  ? Manual Therapy  ? Manual Therapy Myofascial release;Muscle Energy Technique   ? Manual therapy comments completed seperately from all other skilled interventions, completed at beginning of session   ? Myofascial Release Myofascial release and manual stretching completed to left upper arm, upper trapezius, and scapularis region.   ? Muscle Energy Technique Muscle energy technique completed external rotators and shoulder flexors to relax tone and muscle spasm and improve range of motion.   ? ?  ?  ? ?  ? ? ? ? ? ? ? ? ? ? ? OT Short Term Goals - 06/20/21 0932   ? ?  ? OT SHORT TERM GOAL #1  ? Title Pt will be educated and independent  with HEP in order to faciliate her progress in therapy and return to using her LUE for 75% or more of daily tasks. .   ? Time 3    ? Period Weeks   ? Status On-going   ? Target Date 07/10/21   ?  ? OT SHORT TERM GOAL #2  ? Title Pt will report a pain score in her LUE of approximately 4/10 when using her left arm to complete shoulder level reaching tasks.   ? Time 3   ? Period Weeks   ? Status On-going   ?  ? OT SHORT TERM GOAL #3  ? Title Pt will decrease LUE fascial restrictions to minimal amount in order to increase the functional mobility need to complete shoulder level reaching tasks with less difficulty.   ? Time 3   ? Period Weeks   ? Status On-going   ?  ? OT SHORT TERM GOAL #4  ? Title Pt will increase LUE A/ROM to [facility] to increase ability to donn shirts without compensatory strategies.   ? Time 3   ? Period Weeks   ? Status On-going   ?  ? OT SHORT TERM GOAL #5  ? Title Pt will increase strength to 3/5 to increase ability wash hair using BUE.    ? Time 3   ? Period Weeks   ? Status On-going   ? ?  ?  ? ?  ? ? ? ? OT Long Term Goals - 06/20/21 0932   ? ?  ? OT LONG TERM GOAL #1  ? Title Pt will report a pain score of 2/10 or less when using her LUE for daily tasks such as dressing and bathing.   ? Time 6   ? Period Weeks   ? Status On-going   ? Target Date 07/31/21   ?  ? OT LONG TERM GOAL #2  ? Title Pt will increase her LUE strength to 4-/5 in order to return to managing and lifting cookng items of moderate weight.   ? Time 6   ? Period Weeks   ? Status On-going   ? ?  ?  ? ?  ? ? ? ? ? ? ? ? Plan - 07/10/21 1026   ? ? Clinical Impression Statement A: Pt presents with trace fascial restrictions this session in the left upper arm and upper trapezious. Resume muscle energy technique although no additional ROM appeared to have been  achieved. Increased repetitions for AA/ROM to 12. Added shoulder flexion stretch. VC for form and technique.   ? Body Structure / Function / Physical Skills ADL;Strength;Pain;UE functional use;ROM;Fascial restriction;Mobility   ? Plan P: Add PVC pipe slide. Continue with wall wash. Low level reaching (cones  or resistive clothespins)   ? Consulted and Agree with Plan of Care Patient   ? ?  ?  ? ?  ? ? ?Patient will benefit from skilled therapeutic intervention in order to improve the following deficits and impairments:   ?Body Structure / Function / Physical Skills: ADL, Strength, Pain, UE functional use, ROM, Fascial restriction, Mobility ?  ?  ? ? ?Visit Diagnosis: ?Acute pain of left shoulder ? ?Stiffness of left shoulder, not elsewhere classified ? ?Other symptoms and signs involving the musculoskeletal system ? ? ? ?Problem List ?Patient Active Problem List  ? Diagnosis Date Noted  ? Near syncope 02/08/2020  ? Pre-syncope 02/07/2020  ? HTN (hypertension) 02/07/2020  ? Hx of colonic polyps 11/11/2018  ? Family hx of colon cancer 11/11/2018  ? ? ?Ailene Ravel, OTR/L,CBIS  ?[phone] ? ?07/10/2021, 10:29 AM ? ?[place] [place] ?[facility] ?[address] ?[place], [place], [address] ?Phone: [phone]   Fax:  [phone] ? ?Name: Carol Gutierrez ?MRN: [id number] ?Date of Birth: 04/04/1936 ? ?

## 2021-07-11 ENCOUNTER — Ambulatory Visit (HOSPITAL_COMMUNITY): Payer: Medicare Other

## 2021-07-11 ENCOUNTER — Encounter (HOSPITAL_COMMUNITY): Payer: Self-pay

## 2021-07-11 DIAGNOSIS — M25512 Pain in left shoulder: Secondary | ICD-10-CM | POA: Diagnosis not present

## 2021-07-11 DIAGNOSIS — R29898 Other symptoms and signs involving the musculoskeletal system: Secondary | ICD-10-CM | POA: Diagnosis not present

## 2021-07-11 DIAGNOSIS — M25612 Stiffness of left shoulder, not elsewhere classified: Secondary | ICD-10-CM | POA: Diagnosis not present

## 2021-07-11 NOTE — Therapy (Signed)
?[facility] ?[address] ?[place], [place], [address] ?Phone: [phone]   Fax:  [phone] ? ?Occupational Therapy Treatment ? ?Patient Details  ?Name: Carol Gutierrez ?MRN: [id number] ?Date of Birth: 09-10-1935 ?Referring Provider (OT): Rosiland Oz, MD ? ? ?Encounter Date: 07/11/2021 ? ? OT End of Session - 07/11/21 1109   ? ? Visit Number 8   ? Number of Visits 12   ? Date for OT Re-Evaluation 07/31/21   ? Authorization Type 1) Medicare 2) AARP   ? Progress Note Due on Visit 10   ? OT Start Time 1032   pt checked in late  ? OT Stop Time 1108   ? OT Time Calculation (min) 36 min   ? Activity Tolerance Patient tolerated treatment well   ? Behavior During Therapy [facility] for tasks assessed/performed   ? ?  ?  ? ?  ? ? ?Past Medical History:  ?Diagnosis Date  ? Arthritis   ? hands and back  ? Hypertension   ? ? ?Past Surgical History:  ?Procedure Laterality Date  ? COLONOSCOPY N/A 09/08/2012  ? Procedure: COLONOSCOPY;  Surgeon: Rogene Houston, MD;  [facility];  Service: Endoscopy;  Laterality: N/A;  730-moved to 830 Ann to notify pt  ? COLONOSCOPY N/A 01/05/2019  ? Procedure: COLONOSCOPY;  Surgeon: Rogene Houston, MD;  [facility];  Service: Endoscopy;  Laterality: N/A;  100  ? JOINT REPLACEMENT  2008  ? left hip  ? POLYPECTOMY  01/05/2019  ? Procedure: POLYPECTOMY;  Surgeon: Rogene Houston, MD;  [facility];  Service: Endoscopy;;  colon  ? TOTAL HIP ARTHROPLASTY Left Left Hip Replacement  ? ? ?There were no vitals filed for this visit. ? ? Subjective Assessment - 07/11/21 1046   ? ? Subjective  S: It feels better than the other day.   ? Currently in Pain? No/denies   ? ?  ?  ? ?  ? ? ? ? ? [place] OT Assessment - 07/11/21 1047   ? ?  ? Assessment  ? Medical Diagnosis S/P left proximal humerus fracture   ?  ? Precautions  ? Precautions Shoulder   ? Precaution Comments A/ROM and gentle stretching as tolerated   ? ?  ?  ? ?   ? ? ? ? ? ? ? ? ? ? ? OT Treatments/Exercises (OP) - 07/11/21 0001   ? ?  ? Exercises  ? Exercises Shoulder   ?  ? Shoulder Exercises: Supine  ? Protraction PROM;5 reps   ? Horizontal ABduction PROM;5 reps   ? External Rotation PROM;5 reps   ? Internal Rotation PROM;5 reps   ? Flexion PROM;5 reps   ? ABduction PROM;5 reps   ?  ? Shoulder Exercises: Standing  ? Extension Theraband;10 reps   ? Theraband Level (Shoulder Extension) Level 2 (Red)   ? Row Theraband;10 reps   ? Theraband Level (Shoulder Row) Level 2 (Red)   ? Retraction --   attempted. form not correct  ?  ? Shoulder Exercises: ROM/Strengthening  ? UBE (Upper Arm Bike) Level 2 2' forward 2' reverse pace: 5.5   ? Wall Wash 1'   ? Other ROM/Strengthening Exercises PVC pipe slide, 10X flexion   ?  ? Functional Reaching Activities  ? Mid Level Resistive clothespins standing. started at chest level on vertical pole and increased height.   ? ?  ?  ? ?  ? ? ? ? ? ? ? ? ? ? ?  OT Short Term Goals - 06/20/21 0932   ? ?  ? OT SHORT TERM GOAL #1  ? Title Pt will be educated and independent  with HEP in order to faciliate her progress in therapy and return to using her LUE for 75% or more of daily tasks. .   ? Time 3   ? Period Weeks   ? Status On-going   ? Target Date 07/10/21   ?  ? OT SHORT TERM GOAL #2  ? Title Pt will report a pain score in her LUE of approximately 4/10 when using her left arm to complete shoulder level reaching tasks.   ? Time 3   ? Period Weeks   ? Status On-going   ?  ? OT SHORT TERM GOAL #3  ? Title Pt will decrease LUE fascial restrictions to minimal amount in order to increase the functional mobility need to complete shoulder level reaching tasks with less difficulty.   ? Time 3   ? Period Weeks   ? Status On-going   ?  ? OT SHORT TERM GOAL #4  ? Title Pt will increase LUE A/ROM to [facility] to increase ability to donn shirts without compensatory strategies.   ? Time 3   ? Period Weeks   ? Status On-going   ?  ? OT SHORT TERM GOAL #5  ? Title Pt  will increase strength to 3/5 to increase ability wash hair using BUE.    ? Time 3   ? Period Weeks   ? Status On-going   ? ?  ?  ? ?  ? ? ? ? OT Long Term Goals - 06/20/21 0932   ? ?  ? OT LONG TERM GOAL #1  ? Title Pt will report a pain score of 2/10 or less when using her LUE for daily tasks such as dressing and bathing.   ? Time 6   ? Period Weeks   ? Status On-going   ? Target Date 07/31/21   ?  ? OT LONG TERM GOAL #2  ? Title Pt will increase her LUE strength to 4-/5 in order to return to managing and lifting cookng items of moderate weight.   ? Time 6   ? Period Weeks   ? Status On-going   ? ?  ?  ? ?  ? ? ? ? ? ? ? ? Plan - 07/11/21 1110   ? ? Clinical Impression Statement A: Focused on functional reaching and ROM in addition to scapular strengthening using red theraband. Form and technique were good for row and extension when provided with VC and tactile cues. Retraction was attempted although due to decreased form and technique with max difficulty to maintain with cues, it was omitted.   ? Body Structure / Function / Physical Skills ADL;Strength;Pain;UE functional use;ROM;Fascial restriction;Mobility   ? Plan P: Continue with functional reaching and ROM. Scapular strengthening. Attempt proximal shoulder strengthening using washcloth on the door. Continue AA/ROM while standing and completing A/ROM as able with good form and technique.   ? Consulted and Agree with Plan of Care Patient   ? ?  ?  ? ?  ? ? ?Patient will benefit from skilled therapeutic intervention in order to improve the following deficits and impairments:   ?Body Structure / Function / Physical Skills: ADL, Strength, Pain, UE functional use, ROM, Fascial restriction, Mobility ?  ?  ? ? ?Visit Diagnosis: ?Acute pain of left shoulder ? ?Stiffness of left shoulder, not elsewhere  classified ? ?Other symptoms and signs involving the musculoskeletal system ? ? ? ?Problem List ?Patient Active Problem List  ? Diagnosis Date Noted  ? Near syncope  02/08/2020  ? Pre-syncope 02/07/2020  ? HTN (hypertension) 02/07/2020  ? Hx of colonic polyps 11/11/2018  ? Family hx of colon cancer 11/11/2018  ? ? ?Ailene Ravel, OTR/L,CBIS  ?[phone] ? ?07/11/2021, 11:13 AM ? ?[facility] ?[facility] ?[address] ?[place], [place], [address] ?Phone: [phone]   Fax:  [phone] ? ?Name: Carol Gutierrez ?MRN: [id number] ?Date of Birth: 08-Aug-1935 ? ?

## 2021-07-16 ENCOUNTER — Encounter (HOSPITAL_COMMUNITY): Payer: Self-pay

## 2021-07-16 ENCOUNTER — Ambulatory Visit (HOSPITAL_COMMUNITY): Payer: Medicare Other

## 2021-07-16 ENCOUNTER — Other Ambulatory Visit: Payer: Self-pay

## 2021-07-16 DIAGNOSIS — R29898 Other symptoms and signs involving the musculoskeletal system: Secondary | ICD-10-CM | POA: Diagnosis not present

## 2021-07-16 DIAGNOSIS — M25512 Pain in left shoulder: Secondary | ICD-10-CM

## 2021-07-16 DIAGNOSIS — M25612 Stiffness of left shoulder, not elsewhere classified: Secondary | ICD-10-CM

## 2021-07-16 NOTE — Therapy (Signed)
[facility] ?[facility] ?[address] ?[place], [place], [address] ?Phone: [phone]   Fax:  [phone] ? ?Occupational Therapy Treatment ? ?Patient Details  ?Name: Carol Gutierrez ?MRN: [id number] ?Date of Birth: 1935/08/07 ?Referring Provider (OT): Rosiland Oz, MD ? ? ?Encounter Date: 07/16/2021 ? ? OT End of Session - 07/16/21 1104   ? ? Visit Number 9   ? Number of Visits 12   ? Date for OT Re-Evaluation 07/31/21   ? Authorization Type 1) Medicare 2) AARP   ? Progress Note Due on Visit 10   ? OT Start Time 1030   ? OT Stop Time 1108   ? OT Time Calculation (min) 38 min   ? Activity Tolerance Patient tolerated treatment well   ? Behavior During Therapy [facility] for tasks assessed/performed   ? ?  ?  ? ?  ? ? ?Past Medical History:  ?Diagnosis Date  ? Arthritis   ? hands and back  ? Hypertension   ? ? ?Past Surgical History:  ?Procedure Laterality Date  ? COLONOSCOPY N/A 09/08/2012  ? Procedure: COLONOSCOPY;  Surgeon: Rogene Houston, MD;  [facility];  Service: Endoscopy;  Laterality: N/A;  730-moved to 830 Ann to notify pt  ? COLONOSCOPY N/A 01/05/2019  ? Procedure: COLONOSCOPY;  Surgeon: Rogene Houston, MD;  [facility];  Service: Endoscopy;  Laterality: N/A;  100  ? JOINT REPLACEMENT  2008  ? left hip  ? POLYPECTOMY  01/05/2019  ? Procedure: POLYPECTOMY;  Surgeon: Rogene Houston, MD;  [facility];  Service: Endoscopy;;  colon  ? TOTAL HIP ARTHROPLASTY Left Left Hip Replacement  ? ? ?There were no vitals filed for this visit. ? ? Subjective Assessment - 07/16/21 1042   ? ? Subjective  S: Did take something before I came to prepare.   ? Currently in Pain? No/denies   ? ?  ?  ? ?  ? ? ? ? ? [place] OT Assessment - 07/16/21 1041   ? ?  ? Assessment  ? Medical Diagnosis S/P left proximal humerus fracture   ?  ? Precautions  ? Precautions Shoulder   ? Precaution Comments A/ROM and gentle stretching as tolerated   ? ?  ?  ? ?   ? ? ? ? ? ? ? ? ? ? ? OT Treatments/Exercises (OP) - 07/16/21 1043   ? ?  ? Exercises  ? Exercises Shoulder   ?  ? Shoulder Exercises: Supine  ? Protraction PROM;5 reps   ? Horizontal ABduction PROM;5 reps   ? External Rotation PROM;5 reps   ? Internal Rotation PROM;5 reps   ? Flexion PROM;5 reps   ? ABduction PROM;5 reps   ?  ? Shoulder Exercises: Standing  ? Protraction AROM;12 reps   ? Horizontal ABduction AAROM;12 reps   ? External Rotation AAROM;12 reps   ? Internal Rotation AAROM;12 reps   ? Flexion AROM;12 reps   to shoulder level  ? ABduction AROM;12 reps   to shoulder level  ?  ? Shoulder Exercises: ROM/Strengthening  ? UBE (Upper Arm Bike) Level 2 2' forward 2' reverse pace: 8.5   ? Over Head Lace seated; starting at half way mark pt laced chain to the bottom. unlaced once bottom of chain was reached   ? Other ROM/Strengthening Exercises proximal shoulder strengthening using washcloth at door; just under 90 degrees shoulder flexion; 1'   ?  ? Functional Reaching  Activities  ? Mid Level [place]z placed on door focusing on functional reaching. Removed [place] Ellison once all were placed.   ? ?  ?  ? ?  ? ? ? ? ? ? ? ? ? ? ? OT Short Term Goals - 06/20/21 0932   ? ?  ? OT SHORT TERM GOAL #1  ? Title Pt will be educated and independent  with HEP in order to faciliate her progress in therapy and return to using her LUE for 75% or more of daily tasks. .   ? Time 3   ? Period Weeks   ? Status On-going   ? Target Date 07/10/21   ?  ? OT SHORT TERM GOAL #2  ? Title Pt will report a pain score in her LUE of approximately 4/10 when using her left arm to complete shoulder level reaching tasks.   ? Time 3   ? Period Weeks   ? Status On-going   ?  ? OT SHORT TERM GOAL #3  ? Title Pt will decrease LUE fascial restrictions to minimal amount in order to increase the functional mobility need to complete shoulder level reaching tasks with less difficulty.   ? Time 3   ? Period Weeks   ? Status On-going   ?  ? OT SHORT TERM GOAL #4  ?  Title Pt will increase LUE A/ROM to [facility] to increase ability to donn shirts without compensatory strategies.   ? Time 3   ? Period Weeks   ? Status On-going   ?  ? OT SHORT TERM GOAL #5  ? Title Pt will increase strength to 3/5 to increase ability wash hair using BUE.    ? Time 3   ? Period Weeks   ? Status On-going   ? ?  ?  ? ?  ? ? ? ? OT Long Term Goals - 06/20/21 0932   ? ?  ? OT LONG TERM GOAL #1  ? Title Pt will report a pain score of 2/10 or less when using her LUE for daily tasks such as dressing and bathing.   ? Time 6   ? Period Weeks   ? Status On-going   ? Target Date 07/31/21   ?  ? OT LONG TERM GOAL #2  ? Title Pt will increase her LUE strength to 4-/5 in order to return to managing and lifting cookng items of moderate weight.   ? Time 6   ? Period Weeks   ? Status On-going   ? ?  ?  ? ?  ? ? ? ? ? ? ? ? Plan - 07/16/21 1105   ? ? Clinical Impression Statement A: Conitnued to focus on shoulder/scapular stability within patient's available range. Added proximal shoulder strengthening at door using washcloth and overhead lacing. VC for form and technique provided.   ? Body Structure / Function / Physical Skills ADL;Strength;Pain;UE functional use;ROM;Fascial restriction;Mobility   ? Plan P: Complete 10th visit progress note.   ? Consulted and Agree with Plan of Care Patient   ? ?  ?  ? ?  ? ? ?Patient will benefit from skilled therapeutic intervention in order to improve the following deficits and impairments:   ?Body Structure / Function / Physical Skills: ADL, Strength, Pain, UE functional use, ROM, Fascial restriction, Mobility ?  ?  ? ? ?Visit Diagnosis: ?Acute pain of left shoulder ? ?Stiffness of left shoulder, not elsewhere classified ? ?Other symptoms and signs involving the musculoskeletal  system ? ? ? ?Problem List ?Patient Active Problem List  ? Diagnosis Date Noted  ? Near syncope 02/08/2020  ? Pre-syncope 02/07/2020  ? HTN (hypertension) 02/07/2020  ? Hx of colonic polyps 11/11/2018  ?  Family hx of colon cancer 11/11/2018  ? ? ?Ailene Ravel, OTR/L,CBIS  ?[phone] ? ?07/16/2021, 11:12 AM ? ?[place] ?[facility] ?[address] ?[place], [place], [address] ?Phone: [phone]   Fax:  [phone] ? ?Name: Carol Gutierrez ?MRN: [id number] ?Date of Birth: 03-26-1936 ? ?

## 2021-07-18 ENCOUNTER — Other Ambulatory Visit: Payer: Self-pay

## 2021-07-18 ENCOUNTER — Ambulatory Visit (HOSPITAL_COMMUNITY): Payer: Medicare Other

## 2021-07-18 ENCOUNTER — Encounter (HOSPITAL_COMMUNITY): Payer: Self-pay

## 2021-07-18 DIAGNOSIS — M25512 Pain in left shoulder: Secondary | ICD-10-CM | POA: Diagnosis not present

## 2021-07-18 DIAGNOSIS — M25612 Stiffness of left shoulder, not elsewhere classified: Secondary | ICD-10-CM | POA: Diagnosis not present

## 2021-07-18 DIAGNOSIS — R29898 Other symptoms and signs involving the musculoskeletal system: Secondary | ICD-10-CM | POA: Diagnosis not present

## 2021-07-18 NOTE — Therapy (Signed)
?OUTPATIENT OCCUPATIONAL THERAPY TREATMENT NOTE ? ? ?Patient Name: Carol Gutierrez ?MRN: [id number] ?DOB:1935-06-02, 86 y.o., female ?Today's Date: 07/18/2021 ? ?PCP: Carol Noble, MD ?REFERRING PROVIDER: Rosiland Oz, MD ? ?Progress Note ?Reporting Period 06/19/21 to 07/18/21 ? ?See note below for Objective Data and Assessment of Progress/Goals.  ? ?  ? ? OT End of Session - 07/18/21 0831   ? ? Visit Number 10   ? Number of Visits 12   ? Date for OT Re-Evaluation 07/31/21   ? Authorization Type 1) Medicare 2) AARP   ? Progress Note Due on Visit 20   ? OT Start Time 0815   ? OT Stop Time 0856   ? OT Time Calculation (min) 41 min   ? Activity Tolerance Patient tolerated treatment well   ? Behavior During Therapy [facility] for tasks assessed/performed   ? ?  ?  ? ?  ? ? ?Past Medical History:  ?Diagnosis Date  ? Arthritis   ? hands and back  ? Hypertension   ? ?Past Surgical History:  ?Procedure Laterality Date  ? COLONOSCOPY N/A 09/08/2012  ? Procedure: COLONOSCOPY;  Surgeon: Carol Houston, MD;  [facility];  Service: Endoscopy;  Laterality: N/A;  730-moved to 830 Ann to notify pt  ? COLONOSCOPY N/A 01/05/2019  ? Procedure: COLONOSCOPY;  Surgeon: Carol Houston, MD;  [facility]: [facility];  Service: Endoscopy;  Laterality: N/A;  100  ? JOINT REPLACEMENT  2008  ? left hip  ? POLYPECTOMY  01/05/2019  ? Procedure: POLYPECTOMY;  Surgeon: Carol Houston, MD;  [facility];  Service: Endoscopy;;  colon  ? TOTAL HIP ARTHROPLASTY Left Left Hip Replacement  ? ?Patient Active Problem List  ? Diagnosis Date Noted  ? Near syncope 02/08/2020  ? Pre-syncope 02/07/2020  ? HTN (hypertension) 02/07/2020  ? Hx of colonic polyps 11/11/2018  ? Family hx of colon cancer 11/11/2018  ? ? ?ONSET DATE: Oct. 2022 ? ?REFERRING DIAG: S/P  left proximal humerus fracture ? ?THERAPY DIAG:  ?Stiffness of left shoulder, not elsewhere classified ? ?Acute pain of left shoulder ? ?Other symptoms and signs involving the  musculoskeletal system ? ? ?PERTINENT HISTORY: Patient is a 86 y/o female S/P left proximal humerus fracture which ocurredmin October 2022. Pt is well known to this clinic as she sustained a left proximal humerus fracture on 11/30/21. She was referred by Dr. San Gutierrez to occupational therapy for evaluation and treatment. Patient reports that she has an appointment with an orthopedic surgeon on 07/01/21 to determine if surgical intervention is warranted.  ? ?PRECAUTIONS: A/ROM and gentle stretching as tolerated ? ?SUBJECTIVE: S: I can almost get to the back of my hair to fix it.  ? ?PAIN:  ?Are you having pain? No ? ? ? ? ?OBJECTIVE:  ? ?TODAY'S TREATMENT:  ?- P/ROM: supine, 5X shoulder flexion, abduction, IR/er, horizontal abduction ?- A/ROM: standing: 15X flexion (shoulder height), protraction, abduction ( shoulder height), horizontal abduction ?- AA/ROM: standing, 15X IR/er ?- PVC pipe slide, 12X flexion ?- ball pass behind head 10X using tennis ball ?- proximal shoulder strengthening using washcloth at door with shoulder flexed to 90 degrees, 1' ?- UBE: level 2, 2' forward, 2' reverse, pace: 9.0 ?- seated; starting at half way mark pt laced chain to the bottom. unlaced once bottom of chain was reached ? ? ? ?PATIENT EDUCATION: ?Education details:  ?Person educated:    ?IT sales professional:    ?Education comprehension:    ? ? ?  HOME EXERCISE PROGRAM ?eval: table slides; 2/22: AA/ROM standing, 3/14: complete HEP A/ROM for all ranges except IR/er ? ? OT Short Term Goals -   ? ?  ? OT SHORT TERM GOAL #1  ? Title Pt will be educated and independent  with HEP in order to faciliate her progress in therapy and return to using her LUE for 75% or more of daily tasks. .   ? Time 3   ? Period Weeks   ? Status On-going   ? Target Date 07/10/21   ?  ? OT SHORT TERM GOAL #2  ? Title Pt will report a pain score in her LUE of approximately 4/10 when using her left arm to complete shoulder level reaching tasks.   ? Time 3   ? Period Weeks    ? Status On-going   ?  ? OT SHORT TERM GOAL #3  ? Title Pt will decrease LUE fascial restrictions to minimal amount in order to increase the functional mobility need to complete shoulder level reaching tasks with less difficulty.   ? Time 3   ? Period Weeks   ? Status On-going   ?  ? OT SHORT TERM GOAL #4  ? Title Pt will increase LUE A/ROM to [facility] to increase ability to donn shirts without compensatory strategies.   ? Time 3   ? Period Weeks   ? Status On-going   ?  ? OT SHORT TERM GOAL #5  ? Title Pt will increase strength to 3/5 to increase ability wash hair using BUE.    ? Time 3   ? Period Weeks   ? Status On-going   ? ?  ?  ? ?  ? ? ? OT Long Term Goals -   ? ?  ? OT LONG TERM GOAL #1  ? Title Pt will report a pain score of 2/10 or less when using her LUE for daily tasks such as dressing and bathing.   ? Time 6   ? Period Weeks   ? Status On-going   ? Target Date 07/31/21   ?  ? OT LONG TERM GOAL #2  ? Title Pt will increase her LUE strength to 4-/5 in order to return to managing and lifting cookng items of moderate weight.   ? Time 6   ? Period Weeks   ? Status On-going   ? ?  ?  ? ?  ? ? ? Plan - 07/18/21 0832   ? ? Clinical Impression Statement A: 10th visit progress note. Patient reports that she is able to make her bed, almost reach the very back of her head to fix her hair, and she is lifting better than when she started therapy. She reports that her ROM has improved some. She continues to have limited range with flexion, abduction and er primarily. Shoulder level reaching is completed with min-moderate difficulty. VC for form and technique were provided during session.   ? Body Structure / Function / Physical Skills ADL;Strength;Pain;UE functional use;ROM;Fascial restriction;Mobility   ? Plan P: Continue with functional reaching, A/ROM  ? Consulted and Agree with Plan of Care Patient   ? ?  ?  ? ?  ? ? ? ?Carol Gutierrez, OTR/L,CBIS  ?[phone] ? ?07/18/2021, 8:57 AM ? ?  ? ?  ?

## 2021-07-23 ENCOUNTER — Encounter (HOSPITAL_COMMUNITY): Payer: Self-pay

## 2021-07-23 ENCOUNTER — Other Ambulatory Visit: Payer: Self-pay

## 2021-07-23 ENCOUNTER — Ambulatory Visit (HOSPITAL_COMMUNITY): Payer: Medicare Other

## 2021-07-23 DIAGNOSIS — M25612 Stiffness of left shoulder, not elsewhere classified: Secondary | ICD-10-CM | POA: Diagnosis not present

## 2021-07-23 DIAGNOSIS — M25512 Pain in left shoulder: Secondary | ICD-10-CM

## 2021-07-23 DIAGNOSIS — R29898 Other symptoms and signs involving the musculoskeletal system: Secondary | ICD-10-CM

## 2021-07-23 NOTE — Therapy (Signed)
?OUTPATIENT OCCUPATIONAL THERAPY TREATMENT NOTE ? ? ?Patient Name: Carol Gutierrez ?MRN: [id number] ?DOB:[birth year], 86 y.o., female ?Today's Date: 07/23/2021 ? ?PCP: Asencion Noble, MD ?REFERRING PROVIDER: Rosiland Oz, MD ? ?  ? ? OT End of Session - 07/23/21 0818   ? ? Visit Number 11   ? Number of Visits 14   ? Date for OT Re-Evaluation 07/31/21   ? Authorization Type 1) Medicare 2) AARP   ? Progress Note Due on Visit 20   ? OT Start Time 0815   ? OT Stop Time [id number]   ? OT Time Calculation (min) 38 min   ? Activity Tolerance Patient tolerated treatment well   ? Behavior During Therapy [facility] for tasks assessed/performed   ? ?  ?  ? ?  ? ? ?Past Medical History:  ?Diagnosis Date  ? Arthritis   ? hands and back  ? Hypertension   ? ?Past Surgical History:  ?Procedure Laterality Date  ? COLONOSCOPY N/A 09/08/2012  ? Procedure: COLONOSCOPY;  Surgeon: Rogene Houston, MD;  [facility];  Service: Endoscopy;  Laterality: N/A;  730-moved to 830 Ann to notify pt  ? COLONOSCOPY N/A 01/05/2019  ? Procedure: COLONOSCOPY;  Surgeon: Rogene Houston, MD;  [facility];  Service: Endoscopy;  Laterality: N/A;  100  ? JOINT REPLACEMENT  2008  ? left hip  ? POLYPECTOMY  01/05/2019  ? Procedure: POLYPECTOMY;  Surgeon: Rogene Houston, MD;  [facility];  Service: Endoscopy;;  colon  ? TOTAL HIP ARTHROPLASTY Left Left Hip Replacement  ? ?Patient Active Problem List  ? Diagnosis Date Noted  ? Near syncope 02/08/2020  ? Pre-syncope 02/07/2020  ? HTN (hypertension) 02/07/2020  ? Hx of colonic polyps 11/11/2018  ? Family hx of colon cancer 11/11/2018  ? ? ?ONSET DATE: Oct. 2022 ? ?REFERRING DIAG: S/P  left proximal humerus fracture ? ?THERAPY DIAG:  ?Stiffness of left shoulder, not elsewhere classified ? ?Acute pain of left shoulder ? ?Other symptoms and signs involving the musculoskeletal system ? ? ?PERTINENT HISTORY: Patient is a 86 y/o female S/P left proximal humerus fracture which ocurredmin October  2022. Pt is well known to this clinic as she sustained a left proximal humerus fracture on 11/30/21. She was referred by Dr. San Jetty to occupational therapy for evaluation and treatment. Patient reports that she has an appointment with an orthopedic surgeon on 07/01/21 to determine if surgical intervention is warranted.  ? ?PRECAUTIONS: A/ROM and gentle stretching as tolerated ? ?SUBJECTIVE: S: I was working on reaching yesterday ? ?PAIN:  ?Are you having pain? Yes: NPRS scale: 2/10 ?Pain location: left shoulder ?Pain description: sore ?Aggravating factors: using it more ?Relieving factors: nothing used this morning ? ? ? ? ?OBJECTIVE:  ? ?TODAY'S TREATMENT:  ?07/23/21 ?- P/ROM: supine, 5X shoulder flexion, abduction, IR/er, horizontal abduction ?- A/ROM: standing: 15X flexion (shoulder height), protraction, abduction ( shoulder height), horizontal abduction ?- AA/ROM: standing, 15X IR/er ?- Proximal shoulder strengthening: standing, volleyball, chest press 10X, flexion to shoulder level 12X, circles (right/left) 10X ?-Mid level reaching: 10 cones used, attempted to place on 2nd shelf each time before placing on bottom shelf. Completed 2 times.  ?- Ball on the wall: 1' flexion green ball ?- UBE: level 2, 2' forward, 2' reverse, pace: 8.5-9.0 ? ? ?07/18/21 ?- P/ROM: supine, 5X shoulder flexion, abduction, IR/er, horizontal abduction ?- A/ROM: standing: 15X flexion (shoulder height), protraction, abduction ( shoulder height), horizontal abduction ?- AA/ROM: standing,  15X IR/er ?- PVC pipe slide, 12X flexion ?- ball pass behind head 10X using tennis ball ?- proximal shoulder strengthening using washcloth at door with shoulder flexed to 90 degrees, 1' ?- UBE: level 2, 2' forward, 2' reverse, pace: 9.0 ?- seated; starting at half way mark pt laced chain to the bottom. unlaced once bottom of chain was reached ? ? ? ?PATIENT EDUCATION: ?Education details:  ?Person educated:    ?IT sales professional:    ?Education comprehension:     ? ? ?[place] ?eval: table slides; 2/22: AA/ROM standing, 3/14: complete HEP A/ROM for all ranges except IR/er ? ? OT Short Term Goals -   ? ?  ? OT SHORT TERM GOAL #1  ? Title Pt will be educated and independent  with HEP in order to faciliate her progress in therapy and return to using her LUE for 75% or more of daily tasks. .   ? Time 3   ? Period Weeks   ? Status On-going   ? Target Date 07/10/21   ?  ? OT SHORT TERM GOAL #2  ? Title Pt will report a pain score in her LUE of approximately 4/10 when using her left arm to complete shoulder level reaching tasks.   ? Time 3   ? Period Weeks   ? Status On-going   ?  ? OT SHORT TERM GOAL #3  ? Title Pt will decrease LUE fascial restrictions to minimal amount in order to increase the functional mobility need to complete shoulder level reaching tasks with less difficulty.   ? Time 3   ? Period Weeks   ? Status On-going   ?  ? OT SHORT TERM GOAL #4  ? Title Pt will increase LUE A/ROM to [facility] to increase ability to donn shirts without compensatory strategies.   ? Time 3   ? Period Weeks   ? Status On-going   ?  ? OT SHORT TERM GOAL #5  ? Title Pt will increase strength to 3/5 to increase ability wash hair using BUE.    ? Time 3   ? Period Weeks   ? Status On-going   ? ?  ?  ? ?  ? ? ? OT Long Term Goals -   ? ?  ? OT LONG TERM GOAL #1  ? Title Pt will report a pain score of 2/10 or less when using her LUE for daily tasks such as dressing and bathing.   ? Time 6   ? Period Weeks   ? Status On-going   ? Target Date 07/31/21   ?  ? OT LONG TERM GOAL #2  ? Title Pt will increase her LUE strength to 4-/5 in order to return to managing and lifting cookng items of moderate weight.   ? Time 6   ? Period Weeks   ? Status On-going   ? ?  ?  ? ?  ? ? ? Plan - 07/18/21 0832   ? ? Clinical Impression Statement A:  Focused on functional reaching and scapular/shoulder stability during session using ball on the wall and volleyball. Continues to be limited to shoulder level  for functional reaching although demonstrating less scapular elevation during these tasks. VC for form and technique were provided during session.   ? Body Structure / Function / Physical Skills ADL;Strength;Pain;UE functional use;ROM;Fascial restriction;Mobility   ? Plan P: Use basketball instead of volleyball for chest press, flexion, and circles.   ? Consulted and Agree  with Plan of Care Patient   ? ?  ?  ? ?  ? ? ? ?Ailene Ravel, OTR/L,CBIS  ?[phone] ? ?07/23/2021, 8:57 AM ? ?  ? ?  ?

## 2021-07-25 ENCOUNTER — Encounter (HOSPITAL_COMMUNITY): Payer: Self-pay | Admitting: Occupational Therapy

## 2021-07-25 ENCOUNTER — Other Ambulatory Visit: Payer: Self-pay

## 2021-07-25 ENCOUNTER — Ambulatory Visit (HOSPITAL_COMMUNITY): Payer: Medicare Other | Admitting: Occupational Therapy

## 2021-07-25 DIAGNOSIS — M25612 Stiffness of left shoulder, not elsewhere classified: Secondary | ICD-10-CM

## 2021-07-25 DIAGNOSIS — M25512 Pain in left shoulder: Secondary | ICD-10-CM

## 2021-07-25 DIAGNOSIS — R29898 Other symptoms and signs involving the musculoskeletal system: Secondary | ICD-10-CM | POA: Diagnosis not present

## 2021-07-25 NOTE — Therapy (Signed)
?OUTPATIENT OCCUPATIONAL THERAPY TREATMENT NOTE ? ? ?Patient Name: Carol Gutierrez ?MRN: [id number] ?DOB:06/20/1935, 86 y.o., female ?Today's Date: 07/25/2021 ? ?PCP: Asencion Noble, MD ?REFERRING PROVIDER: Rosiland Oz, MD ? ?  ? ? OT End of Session - 07/25/21 1120   ? ? Visit Number 12   ? Number of Visits 14   ? Date for OT Re-Evaluation 07/31/21   ? Authorization Type 1) Medicare 2) AARP   ? Progress Note Due on Visit 20   ? OT Start Time [phone]   ? OT Stop Time 1027   ? OT Time Calculation (min) 41 min   ? Activity Tolerance Patient tolerated treatment well   ? Behavior During Therapy [facility] for tasks assessed/performed   ? ?  ?  ? ?  ? ? ? ?Past Medical History:  ?Diagnosis Date  ? Arthritis   ? hands and back  ? Hypertension   ? ?Past Surgical History:  ?Procedure Laterality Date  ? COLONOSCOPY N/A 09/08/2012  ? Procedure: COLONOSCOPY;  Surgeon: Rogene Houston, MD;  [facility];  Service: Endoscopy;  Laterality: N/A;  730-moved to 830 Ann to notify pt  ? COLONOSCOPY N/A 01/05/2019  ? Procedure: COLONOSCOPY;  Surgeon: Rogene Houston, MD;  [facility];  Service: Endoscopy;  Laterality: N/A;  100  ? JOINT REPLACEMENT  2008  ? left hip  ? POLYPECTOMY  01/05/2019  ? Procedure: POLYPECTOMY;  Surgeon: Rogene Houston, MD;  [facility];  Service: Endoscopy;;  colon  ? TOTAL HIP ARTHROPLASTY Left Left Hip Replacement  ? ?Patient Active Problem List  ? Diagnosis Date Noted  ? Near syncope 02/08/2020  ? Pre-syncope 02/07/2020  ? HTN (hypertension) 02/07/2020  ? Hx of colonic polyps 11/11/2018  ? Family hx of colon cancer 11/11/2018  ? ? ?ONSET DATE: Oct. 2022 ? ?REFERRING DIAG: S/P  left proximal humerus fracture ? ?THERAPY DIAG:  ?Stiffness of left shoulder, not elsewhere classified ? ?Acute pain of left shoulder ? ?Other symptoms and signs involving the musculoskeletal system ? ? ?PERTINENT HISTORY: Patient is a 86 y/o female S/P left proximal humerus fracture which ocurredmin  October 2022. Pt is well known to this clinic as she sustained a left proximal humerus fracture on 11/30/21. She was referred by Dr. San Jetty to occupational therapy for evaluation and treatment. Patient reports that she has an appointment with an orthopedic surgeon on 07/01/21 to determine if surgical intervention is warranted.  ? ?PRECAUTIONS: A/ROM and gentle stretching as tolerated ? ?SUBJECTIVE: S: I exercised some last night.  ? ?PAIN:  ?Are you having pain? Yes: NPRS scale: 0/10 ?Pain location:   ?Pain description:   ?Aggravating factors:   ?Relieving factors:   ? ? ? ? ?OBJECTIVE:  ? ?TODAY'S TREATMENT:  ?07/25/21 ?- P/ROM: supine, 5X shoulder flexion, abduction, IR/er, horizontal abduction ?- A/ROM: standing: 15X flexion (shoulder height), protraction, abduction ( shoulder height), horizontal abduction ?- AA/ROM: standing, 15X IR/er ?- Proximal shoulder strengthening: standing, basketball, chest press 12X, flexion to shoulder level 12X, circles (right/left) 10X ?- Ball on the wall: 1' flexion green ball ?-Scapular theraband: red, row, extension, 12X each ?-Overhead lacing: lacing down and back up ?-UBE: level 2, 2' forward 2' reverse, pace: 8.0 ?-Functional reaching: pt removing items from bottom shelf of kitchen cabinet then replacing, working on reaching into flexion ? ? ?07/23/21 ?- P/ROM: supine, 5X shoulder flexion, abduction, IR/er, horizontal abduction ?- A/ROM: standing: 15X flexion (shoulder height), protraction, abduction (  shoulder height), horizontal abduction ?- AA/ROM: standing, 15X IR/er ?- Proximal shoulder strengthening: standing, volleyball, chest press 10X, flexion to shoulder level 12X, circles (right/left) 10X ?-Mid level reaching: 10 cones used, attempted to place on 2nd shelf each time before placing on bottom shelf. Completed 2 times.  ?- Ball on the wall: 1' flexion green ball ?- UBE: level 2, 2' forward, 2' reverse, pace: 8.5-9.0 ? ? ?07/18/21 ?- P/ROM: supine, 5X shoulder flexion,  abduction, IR/er, horizontal abduction ?- A/ROM: standing: 15X flexion (shoulder height), protraction, abduction ( shoulder height), horizontal abduction ?- AA/ROM: standing, 15X IR/er ?- PVC pipe slide, 12X flexion ?- ball pass behind head 10X using tennis ball ?- proximal shoulder strengthening using washcloth at door with shoulder flexed to 90 degrees, 1' ?- UBE: level 2, 2' forward, 2' reverse, pace: 9.0 ?- seated; starting at half way mark pt laced chain to the bottom. unlaced once bottom of chain was reached ? ? ? ?PATIENT EDUCATION: ?Education details: N/A ?Person educated:    ?Education method:    ?Education comprehension:    ? ? ?[place] ?eval: table slides; 2/22: AA/ROM standing, 3/14: complete HEP A/ROM for all ranges except IR/er ? ? OT Short Term Goals -   ? ?  ? OT SHORT TERM GOAL #1  ? Title Pt will be educated and independent  with HEP in order to faciliate her progress in therapy and return to using her LUE for 75% or more of daily tasks. .   ? Time 3   ? Period Weeks   ? Status On-going   ? Target Date 07/10/21   ?  ? OT SHORT TERM GOAL #2  ? Title Pt will report a pain score in her LUE of approximately 4/10 when using her left arm to complete shoulder level reaching tasks.   ? Time 3   ? Period Weeks   ? Status On-going   ?  ? OT SHORT TERM GOAL #3  ? Title Pt will decrease LUE fascial restrictions to minimal amount in order to increase the functional mobility need to complete shoulder level reaching tasks with less difficulty.   ? Time 3   ? Period Weeks   ? Status On-going   ?  ? OT SHORT TERM GOAL #4  ? Title Pt will increase LUE A/ROM to [facility] to increase ability to donn shirts without compensatory strategies.   ? Time 3   ? Period Weeks   ? Status On-going   ?  ? OT SHORT TERM GOAL #5  ? Title Pt will increase strength to 3/5 to increase ability wash hair using BUE.    ? Time 3   ? Period Weeks   ? Status On-going   ? ?  ?  ? ?  ? ? ? OT Long Term Goals -   ? ?  ? OT LONG TERM  GOAL #1  ? Title Pt will report a pain score of 2/10 or less when using her LUE for daily tasks such as dressing and bathing.   ? Time 6   ? Period Weeks   ? Status On-going   ? Target Date 07/31/21   ?  ? OT LONG TERM GOAL #2  ? Title Pt will increase her LUE strength to 4-/5 in order to return to managing and lifting cookng items of moderate weight.   ? Time 6   ? Period Weeks   ? Status On-going   ? ?  ?  ? ?  ? ? ?  Plan - 07/18/21 0832   ? ? Clinical Impression Statement A: Continued with focus on functional reaching and scapular/shoulder stability during session, progressed to basketball for proximal shoulder strengthening. Continued with scapular and shoulder strengthening, completing overhead lacing, scapular theraband, and ball on wall. Pt requiring rest break after overhead lacing. Verbal cuing for form and technique.   ? Body Structure / Function / Physical Skills ADL;Strength;Pain;UE functional use;ROM;Fascial restriction;Mobility   ? Plan P: continue with shoulder and scapular strengthening in preparation for reassessment and discharge next session.   ? Consulted and Agree with Plan of Care Patient   ? ?  ?  ? ?  ? ? ? ?[name], OTR/L  ?[phone] ?07/25/2021, 11:20 AM ? ?  ? ?  ?

## 2021-07-26 ENCOUNTER — Encounter (HOSPITAL_COMMUNITY): Payer: Self-pay

## 2021-07-26 NOTE — Therapy (Signed)
OCCUPATIONAL THERAPY DISCHARGE SUMMARY ? ?Visits from Start of Care: 12 ? ?Current functional level related to goals / functional outcomes: ?Patient reports that she is able to make her bed, almost reach the very back of her head to fix her hair, and she is lifting better than when she started therapy. She reports that her ROM has improved some.  ?  ?Remaining deficits: ?She continues to have limited range with flexion, abduction and er primarily. Shoulder level reaching is completed with min-moderate difficulty ?  ?Education / Equipment: ?eval: table slides; 2/22: AA/ROM standing, 3/14: complete HEP A/ROM for all ranges except IR/er  ? ?Patient agrees to discharge. Patient goals were not met. Patient is being discharged due to  plan for shoulder surgery to LUE and will stop therapy until Surgeon recommends that she start for post-op care.. ? ? ? ?

## 2021-07-30 ENCOUNTER — Encounter (HOSPITAL_COMMUNITY): Payer: Medicare Other

## 2021-07-30 DIAGNOSIS — Z8673 Personal history of transient ischemic attack (TIA), and cerebral infarction without residual deficits: Secondary | ICD-10-CM | POA: Diagnosis not present

## 2021-07-30 DIAGNOSIS — M19012 Primary osteoarthritis, left shoulder: Secondary | ICD-10-CM | POA: Diagnosis not present

## 2021-07-30 DIAGNOSIS — I10 Essential (primary) hypertension: Secondary | ICD-10-CM | POA: Diagnosis not present

## 2021-07-30 DIAGNOSIS — Z79899 Other long term (current) drug therapy: Secondary | ICD-10-CM | POA: Diagnosis not present

## 2021-07-30 DIAGNOSIS — M19112 Post-traumatic osteoarthritis, left shoulder: Secondary | ICD-10-CM | POA: Diagnosis not present

## 2021-07-30 DIAGNOSIS — Z01810 Encounter for preprocedural cardiovascular examination: Secondary | ICD-10-CM | POA: Diagnosis not present

## 2021-07-30 DIAGNOSIS — M66322 Spontaneous rupture of flexor tendons, left upper arm: Secondary | ICD-10-CM | POA: Diagnosis not present

## 2021-07-30 DIAGNOSIS — M67912 Unspecified disorder of synovium and tendon, left shoulder: Secondary | ICD-10-CM | POA: Diagnosis not present

## 2021-07-30 DIAGNOSIS — Z01818 Encounter for other preprocedural examination: Secondary | ICD-10-CM | POA: Diagnosis not present

## 2021-07-30 DIAGNOSIS — E785 Hyperlipidemia, unspecified: Secondary | ICD-10-CM | POA: Diagnosis not present

## 2021-07-30 DIAGNOSIS — I63511 Cerebral infarction due to unspecified occlusion or stenosis of right middle cerebral artery: Secondary | ICD-10-CM | POA: Diagnosis not present

## 2021-07-30 DIAGNOSIS — R32 Unspecified urinary incontinence: Secondary | ICD-10-CM | POA: Diagnosis not present

## 2021-07-31 ENCOUNTER — Encounter (HOSPITAL_COMMUNITY): Payer: Medicare Other

## 2021-08-07 DIAGNOSIS — E785 Hyperlipidemia, unspecified: Secondary | ICD-10-CM | POA: Diagnosis not present

## 2021-08-07 DIAGNOSIS — M19112 Post-traumatic osteoarthritis, left shoulder: Secondary | ICD-10-CM | POA: Diagnosis not present

## 2021-08-07 DIAGNOSIS — R32 Unspecified urinary incontinence: Secondary | ICD-10-CM | POA: Diagnosis not present

## 2021-08-07 DIAGNOSIS — G8918 Other acute postprocedural pain: Secondary | ICD-10-CM | POA: Diagnosis not present

## 2021-08-07 DIAGNOSIS — S42202P Unspecified fracture of upper end of left humerus, subsequent encounter for fracture with malunion: Secondary | ICD-10-CM | POA: Diagnosis not present

## 2021-08-07 DIAGNOSIS — M67912 Unspecified disorder of synovium and tendon, left shoulder: Secondary | ICD-10-CM | POA: Diagnosis not present

## 2021-08-07 DIAGNOSIS — Z96612 Presence of left artificial shoulder joint: Secondary | ICD-10-CM | POA: Diagnosis not present

## 2021-08-07 DIAGNOSIS — I10 Essential (primary) hypertension: Secondary | ICD-10-CM | POA: Diagnosis not present

## 2021-08-07 DIAGNOSIS — M25512 Pain in left shoulder: Secondary | ICD-10-CM | POA: Diagnosis not present

## 2021-08-07 DIAGNOSIS — Z471 Aftercare following joint replacement surgery: Secondary | ICD-10-CM | POA: Diagnosis not present

## 2021-08-07 DIAGNOSIS — M66322 Spontaneous rupture of flexor tendons, left upper arm: Secondary | ICD-10-CM | POA: Diagnosis not present

## 2021-08-08 DIAGNOSIS — E785 Hyperlipidemia, unspecified: Secondary | ICD-10-CM | POA: Diagnosis not present

## 2021-08-08 DIAGNOSIS — M19112 Post-traumatic osteoarthritis, left shoulder: Secondary | ICD-10-CM | POA: Diagnosis not present

## 2021-08-08 DIAGNOSIS — I10 Essential (primary) hypertension: Secondary | ICD-10-CM | POA: Diagnosis not present

## 2021-08-08 DIAGNOSIS — M67912 Unspecified disorder of synovium and tendon, left shoulder: Secondary | ICD-10-CM | POA: Diagnosis not present

## 2021-08-08 DIAGNOSIS — M66322 Spontaneous rupture of flexor tendons, left upper arm: Secondary | ICD-10-CM | POA: Diagnosis not present

## 2021-08-08 DIAGNOSIS — R32 Unspecified urinary incontinence: Secondary | ICD-10-CM | POA: Diagnosis not present

## 2021-08-08 DIAGNOSIS — M25512 Pain in left shoulder: Secondary | ICD-10-CM | POA: Diagnosis not present

## 2021-08-08 DIAGNOSIS — G8918 Other acute postprocedural pain: Secondary | ICD-10-CM | POA: Diagnosis not present

## 2021-08-21 DIAGNOSIS — Z471 Aftercare following joint replacement surgery: Secondary | ICD-10-CM | POA: Diagnosis not present

## 2021-08-21 DIAGNOSIS — Z96612 Presence of left artificial shoulder joint: Secondary | ICD-10-CM | POA: Diagnosis not present

## 2021-08-21 DIAGNOSIS — M19112 Post-traumatic osteoarthritis, left shoulder: Secondary | ICD-10-CM | POA: Diagnosis not present

## 2021-08-26 DIAGNOSIS — Z20822 Contact with and (suspected) exposure to covid-19: Secondary | ICD-10-CM | POA: Diagnosis not present

## 2021-09-04 DIAGNOSIS — I10 Essential (primary) hypertension: Secondary | ICD-10-CM | POA: Diagnosis not present

## 2021-09-04 DIAGNOSIS — Z7982 Long term (current) use of aspirin: Secondary | ICD-10-CM | POA: Diagnosis not present

## 2021-09-04 DIAGNOSIS — Z96612 Presence of left artificial shoulder joint: Secondary | ICD-10-CM | POA: Diagnosis not present

## 2021-09-04 DIAGNOSIS — Z8673 Personal history of transient ischemic attack (TIA), and cerebral infarction without residual deficits: Secondary | ICD-10-CM | POA: Diagnosis not present

## 2021-09-04 DIAGNOSIS — Z471 Aftercare following joint replacement surgery: Secondary | ICD-10-CM | POA: Diagnosis not present

## 2021-09-04 DIAGNOSIS — Z9181 History of falling: Secondary | ICD-10-CM | POA: Diagnosis not present

## 2021-09-10 DIAGNOSIS — I10 Essential (primary) hypertension: Secondary | ICD-10-CM | POA: Diagnosis not present

## 2021-09-10 DIAGNOSIS — Z96612 Presence of left artificial shoulder joint: Secondary | ICD-10-CM | POA: Diagnosis not present

## 2021-09-10 DIAGNOSIS — Z471 Aftercare following joint replacement surgery: Secondary | ICD-10-CM | POA: Diagnosis not present

## 2021-09-10 DIAGNOSIS — Z8673 Personal history of transient ischemic attack (TIA), and cerebral infarction without residual deficits: Secondary | ICD-10-CM | POA: Diagnosis not present

## 2021-09-10 DIAGNOSIS — Z9181 History of falling: Secondary | ICD-10-CM | POA: Diagnosis not present

## 2021-09-10 DIAGNOSIS — Z7982 Long term (current) use of aspirin: Secondary | ICD-10-CM | POA: Diagnosis not present

## 2021-09-16 DIAGNOSIS — Z9181 History of falling: Secondary | ICD-10-CM | POA: Diagnosis not present

## 2021-09-16 DIAGNOSIS — Z8673 Personal history of transient ischemic attack (TIA), and cerebral infarction without residual deficits: Secondary | ICD-10-CM | POA: Diagnosis not present

## 2021-09-16 DIAGNOSIS — Z7982 Long term (current) use of aspirin: Secondary | ICD-10-CM | POA: Diagnosis not present

## 2021-09-16 DIAGNOSIS — Z96612 Presence of left artificial shoulder joint: Secondary | ICD-10-CM | POA: Diagnosis not present

## 2021-09-16 DIAGNOSIS — I10 Essential (primary) hypertension: Secondary | ICD-10-CM | POA: Diagnosis not present

## 2021-09-16 DIAGNOSIS — Z471 Aftercare following joint replacement surgery: Secondary | ICD-10-CM | POA: Diagnosis not present

## 2021-09-19 DIAGNOSIS — Z9181 History of falling: Secondary | ICD-10-CM | POA: Diagnosis not present

## 2021-09-19 DIAGNOSIS — Z7982 Long term (current) use of aspirin: Secondary | ICD-10-CM | POA: Diagnosis not present

## 2021-09-19 DIAGNOSIS — I10 Essential (primary) hypertension: Secondary | ICD-10-CM | POA: Diagnosis not present

## 2021-09-19 DIAGNOSIS — Z96612 Presence of left artificial shoulder joint: Secondary | ICD-10-CM | POA: Diagnosis not present

## 2021-09-19 DIAGNOSIS — Z8673 Personal history of transient ischemic attack (TIA), and cerebral infarction without residual deficits: Secondary | ICD-10-CM | POA: Diagnosis not present

## 2021-09-19 DIAGNOSIS — Z471 Aftercare following joint replacement surgery: Secondary | ICD-10-CM | POA: Diagnosis not present

## 2021-09-20 DIAGNOSIS — Z9181 History of falling: Secondary | ICD-10-CM | POA: Diagnosis not present

## 2021-09-20 DIAGNOSIS — Z96612 Presence of left artificial shoulder joint: Secondary | ICD-10-CM | POA: Diagnosis not present

## 2021-09-20 DIAGNOSIS — I10 Essential (primary) hypertension: Secondary | ICD-10-CM | POA: Diagnosis not present

## 2021-09-20 DIAGNOSIS — Z471 Aftercare following joint replacement surgery: Secondary | ICD-10-CM | POA: Diagnosis not present

## 2021-09-20 DIAGNOSIS — Z7982 Long term (current) use of aspirin: Secondary | ICD-10-CM | POA: Diagnosis not present

## 2021-09-20 DIAGNOSIS — Z8673 Personal history of transient ischemic attack (TIA), and cerebral infarction without residual deficits: Secondary | ICD-10-CM | POA: Diagnosis not present

## 2021-09-23 DIAGNOSIS — Z96612 Presence of left artificial shoulder joint: Secondary | ICD-10-CM | POA: Diagnosis not present

## 2021-09-23 DIAGNOSIS — M810 Age-related osteoporosis without current pathological fracture: Secondary | ICD-10-CM | POA: Diagnosis not present

## 2021-09-23 DIAGNOSIS — M19112 Post-traumatic osteoarthritis, left shoulder: Secondary | ICD-10-CM | POA: Diagnosis not present

## 2021-09-23 DIAGNOSIS — Z780 Asymptomatic menopausal state: Secondary | ICD-10-CM | POA: Diagnosis not present

## 2021-09-24 DIAGNOSIS — Z8673 Personal history of transient ischemic attack (TIA), and cerebral infarction without residual deficits: Secondary | ICD-10-CM | POA: Diagnosis not present

## 2021-09-24 DIAGNOSIS — Z7982 Long term (current) use of aspirin: Secondary | ICD-10-CM | POA: Diagnosis not present

## 2021-09-24 DIAGNOSIS — Z9181 History of falling: Secondary | ICD-10-CM | POA: Diagnosis not present

## 2021-09-24 DIAGNOSIS — I10 Essential (primary) hypertension: Secondary | ICD-10-CM | POA: Diagnosis not present

## 2021-09-24 DIAGNOSIS — Z96612 Presence of left artificial shoulder joint: Secondary | ICD-10-CM | POA: Diagnosis not present

## 2021-09-24 DIAGNOSIS — Z471 Aftercare following joint replacement surgery: Secondary | ICD-10-CM | POA: Diagnosis not present

## 2021-09-25 DIAGNOSIS — Z8673 Personal history of transient ischemic attack (TIA), and cerebral infarction without residual deficits: Secondary | ICD-10-CM | POA: Diagnosis not present

## 2021-09-25 DIAGNOSIS — Z96612 Presence of left artificial shoulder joint: Secondary | ICD-10-CM | POA: Diagnosis not present

## 2021-09-25 DIAGNOSIS — Z9181 History of falling: Secondary | ICD-10-CM | POA: Diagnosis not present

## 2021-09-25 DIAGNOSIS — Z7982 Long term (current) use of aspirin: Secondary | ICD-10-CM | POA: Diagnosis not present

## 2021-09-25 DIAGNOSIS — I10 Essential (primary) hypertension: Secondary | ICD-10-CM | POA: Diagnosis not present

## 2021-09-25 DIAGNOSIS — Z471 Aftercare following joint replacement surgery: Secondary | ICD-10-CM | POA: Diagnosis not present

## 2021-10-10 ENCOUNTER — Ambulatory Visit (HOSPITAL_COMMUNITY): Payer: Medicare Other | Attending: Sports Medicine | Admitting: Physical Therapy

## 2021-11-07 ENCOUNTER — Ambulatory Visit (HOSPITAL_COMMUNITY): Payer: Medicare Other | Attending: Sports Medicine

## 2021-11-07 DIAGNOSIS — G8929 Other chronic pain: Secondary | ICD-10-CM | POA: Diagnosis not present

## 2021-11-07 DIAGNOSIS — M25512 Pain in left shoulder: Secondary | ICD-10-CM | POA: Insufficient documentation

## 2021-11-07 DIAGNOSIS — M25612 Stiffness of left shoulder, not elsewhere classified: Secondary | ICD-10-CM | POA: Insufficient documentation

## 2021-11-07 DIAGNOSIS — R29898 Other symptoms and signs involving the musculoskeletal system: Secondary | ICD-10-CM | POA: Insufficient documentation

## 2021-11-07 DIAGNOSIS — M19112 Post-traumatic osteoarthritis, left shoulder: Secondary | ICD-10-CM | POA: Insufficient documentation

## 2021-11-07 NOTE — Therapy (Signed)
OUTPATIENT PHYSICAL THERAPY SHOULDER EVALUATION   Patient Name: Carol Gutierrez MRN: [id number] DOB:[birth year], 86 y.o., female Today's Date: 11/07/2021   PT End of Session - 11/07/21 1120     Visit Number 1    Number of Visits 8    Date for PT Re-Evaluation 12/12/21    Authorization Type Medicare Part A and AARP suppliment    Progress Note Due on Visit 10    PT Start Time 1034    PT Stop Time 1115    PT Time Calculation (min) 41 min             Past Medical History:  Diagnosis Date   Arthritis    hands and back   Hypertension    Past Surgical History:  Procedure Laterality Date   COLONOSCOPY N/A 09/08/2012   Procedure: COLONOSCOPY;  Surgeon: Carol Houston, MD;  [facility];  Service: Endoscopy;  Laterality: N/A;  730-moved to 830 Ann to notify pt   COLONOSCOPY N/A 01/05/2019   Procedure: COLONOSCOPY;  Surgeon: Carol Houston, MD;  [facility];  Service: Endoscopy;  Laterality: N/A;  100   JOINT REPLACEMENT  2008   left hip   POLYPECTOMY  01/05/2019   Procedure: POLYPECTOMY;  Surgeon: Carol Houston, MD;  [facility];  Service: Endoscopy;;  colon   TOTAL HIP ARTHROPLASTY Left Left Hip Replacement   Patient Active Problem List   Diagnosis Date Noted   Near syncope 02/08/2020   Pre-syncope 02/07/2020   HTN (hypertension) 02/07/2020   Hx of colonic polyps 11/11/2018   Family hx of colon cancer 11/11/2018    PCP: Carol Noble, MD  REFERRING PROVIDER:   Honor Junes, MD    REFERRING DIAG: [id number] (ICD-10-CM) - Post-traumatic osteoarthritis, left shoulder   THERAPY DIAG:  Post-traumatic osteoarthritis of left shoulder  Chronic left shoulder pain  Rationale for Evaluation and Treatment Rehabilitation  ONSET DATE: 04/505/2023  SUBJECTIVE:                                                                                                                                                                                       SUBJECTIVE STATEMENT: Fell back in October of last year; fracture pelvis, tailbone and left shoulder; continued pain. Went to [facility] ER after her fall. When she had continued pain and difficulty he recommended rTSA; she reports some pain in her right non surgical shoulder today.    PERTINENT HISTORY: S/P reverse total shoulder arthroplasty, left 08/07/2021  Nontraumatic rupture of left long head biceps tendon   Post-traumatic osteoarthritis of left shoulder   Rotator cuff dysfunction, left  Nontraumatic rupture of left long head biceps tendon [M66.322]   Post-traumatic osteoarthritis of left shoulder [M19.112]   Rotator cuff dysfunction, left [M67.912]     Procedures   PR RECONSTR TOTAL SHOULDER IMPLANT   TOTAL SHOULDER REVERSE ARTHROPLASTY    Previous left shoulder fracture 5 years ago  osteoporosis  PAIN:  Are you having pain? No  PRECAUTIONS: Fall  WEIGHT BEARING RESTRICTIONS No  FALLS:  Has patient fallen in last 6 months? Yes. Number of falls 1  LIVING ENVIRONMENT: Lives with: lives alone Lives in: House/apartment Stairs: Yes: Internal: 13 steps; can reach both and External: 4 steps; none Has following equipment at home: Single point cane, Walker - 2 wheeled, Wheelchair (manual), shower chair, bed side commode, and Ramped entry  OCCUPATION: retired  PLOF: [place] return to plof  OBJECTIVE:   DIAGNOSTIC FINDINGS:  Done at [place]:  Quick Dash 42  COGNITION:  Overall cognitive status: Within functional limits for tasks assessed       UPPER EXTREMITY ROM:   Active ROM Right eval Left eval  Shoulder flexion Sitting 160 ~40 deg with substitution in sitting Supine 74 degrees (scaption)  Shoulder extension    Shoulder abduction    Shoulder adduction    Shoulder internal rotation  To approximate trunk  Shoulder external rotation  30  Elbow flexion  wfl  Elbow extension  wfl  Wrist flexion    Wrist  extension    Wrist ulnar deviation    Wrist radial deviation    Wrist pronation    Wrist supination    (Blank rows = not tested)  UPPER EXTREMITY MMT:  MMT Right eval Left eval  Shoulder flexion    Shoulder extension    Shoulder abduction    Shoulder adduction    Shoulder internal rotation    Shoulder external rotation    Middle trapezius    Lower trapezius    Elbow flexion  3+  Elbow extension  3+  Wrist flexion    Wrist extension    Wrist ulnar deviation    Wrist radial deviation    Wrist pronation    Wrist supination    Grip strength (lbs) right hand dominant 43# 25#  (Blank rows = not tested)   PALPATION:  Soreness only anterior shoulder   TODAY'S TREATMENT:  Physical therapy evaluation and HEP instruction   PATIENT EDUCATION:  Education details: Patient educated on exam findings, POC, scope of PT, HEP.  Person educated: Patient Education method: Explanation, Demonstration, and Handouts Education comprehension: verbalized understanding, returned demonstration, verbal cues required, and tactile cues required   HOME EXERCISE PROGRAM: Access Code: [email or web address] URL: [email or web address] Date: 11/07/2021 Prepared by: [facility]  Exercises - Supine Shoulder Flexion with Dowel  - 2 x daily - 7 x weekly - 1 sets - 10 reps - Supine Shoulder Flexion AAROM with Hands Clasped  - 2 x daily - 7 x weekly - 1 sets - 10 reps - Supine Shoulder External Rotation in 45 Degrees Abduction AAROM with Dowel  - 2 x daily - 7 x weekly - 1 sets - 10 reps - Putty Squeezes  - 1 x daily - 7 x weekly - 1 sets - 1 reps - 5 min hold  ASSESSMENT:  CLINICAL IMPRESSION: Patient is a pleasant 86 y.o. lady who was seen today for physical therapy evaluation and treatment for s/p left reverse shoulder replacement.    OBJECTIVE IMPAIRMENTS decreased activity tolerance, decreased endurance, decreased  mobility, decreased ROM, decreased strength, hypomobility, increased fascial  restrictions, impaired perceived functional ability, impaired flexibility, impaired UE functional use, and pain.   ACTIVITY LIMITATIONS carrying, lifting, bathing, toileting, dressing, reach over head, and hygiene/grooming  PARTICIPATION LIMITATIONS: meal prep, cleaning, laundry, driving, shopping, and community activity  PERSONAL FACTORS Age and 1 comorbidity: osteoporosis  are also affecting patient's functional outcome.   REHAB POTENTIAL: Good  CLINICAL DECISION MAKING: Stable/uncomplicated  EVALUATION COMPLEXITY: Low   GOALS: Goals reviewed with patient? No  SHORT TERM GOALS: Target date: 11/28/2021    1.   patient will be independent with initial HEP Baseline: Goal status: INITIAL  2.  Patient will improve left shoulder AROM flexion in supine to 90 degrees to improve ability to reach in the refrigerator with her left hand to reach sandwich making ingredients. Baseline: 74 Goal status: INITIAL  LONG TERM GOALS: Target date: 12/12/2021  1. Patient will be independent in self management strategies to improve quality of life and functional outcomes.  Baseline:  Goal status: INITIAL  2.  Patient will be able to actively raise left shoulder to 90 degrees in sitting to improve ability to reach into cabinets in the kitchen with left hand.  Baseline: ~40 Goal status: INITIAL  3.  Patient will improve her qDASH score to 25% or less to demonstrate improved functional mobility.   Baseline: 41 Goal status: INITIAL  4.  Patient will increase left grip strength by 5#'s to improve ability to open jars in the kitchen.  Baseline: 25# Goal status: INITIAL    PLAN: PT FREQUENCY: 2x/week  PT DURATION: 4 weeks  PLANNED INTERVENTIONS: Therapeutic exercises, Therapeutic activity, Neuromuscular re-education, Balance training, Gait training, Patient/Family education, Joint manipulation, Joint mobilization, Stair training, Orthotic/Fit training, DME instructions, Aquatic Therapy, Dry  Needling, Electrical stimulation, Spinal manipulation, Spinal mobilization, Cryotherapy, Moist heat, Compression bandaging, scar mobilization, Splintting, Taping, Traction, Ultrasound, Ionotophoresis '4mg'$ /ml Dexamethasone, and Manual therapy   PLAN FOR NEXT SESSION: Review HEP and goals; pulley; left shoulder strengthening and mobility per protocol; she is 3 months s/p left rTSA. She will be out of town next week so next visit will be when she returns from her trip.    11:33 AM, 11/07/21 Kayleanna Lorman Small Liliani Bobo MPT [facility] [place] [phone]

## 2021-11-19 ENCOUNTER — Ambulatory Visit (HOSPITAL_COMMUNITY): Payer: Medicare Other

## 2021-11-19 ENCOUNTER — Encounter (HOSPITAL_COMMUNITY): Payer: Self-pay

## 2021-11-19 DIAGNOSIS — M25512 Pain in left shoulder: Secondary | ICD-10-CM | POA: Diagnosis not present

## 2021-11-19 DIAGNOSIS — M19112 Post-traumatic osteoarthritis, left shoulder: Secondary | ICD-10-CM

## 2021-11-19 DIAGNOSIS — M25612 Stiffness of left shoulder, not elsewhere classified: Secondary | ICD-10-CM | POA: Diagnosis not present

## 2021-11-19 DIAGNOSIS — G8929 Other chronic pain: Secondary | ICD-10-CM | POA: Diagnosis not present

## 2021-11-19 DIAGNOSIS — R29898 Other symptoms and signs involving the musculoskeletal system: Secondary | ICD-10-CM | POA: Diagnosis not present

## 2021-11-19 NOTE — Therapy (Signed)
OUTPATIENT PHYSICAL THERAPY SHOULDER TREATMENT   Patient Name: Carol Gutierrez MRN: [id number] DOB:October 25, 1935, 86 y.o., female Today's Date: 11/19/2021   PT End of Session - 11/19/21 1806     Visit Number 2    Number of Visits 8    Date for PT Re-Evaluation 12/12/21    Authorization Type Medicare Part A and AARP suppliment    Progress Note Due on Visit 10    PT Start Time 1605    PT Stop Time 1647    PT Time Calculation (min) 42 min    Activity Tolerance Patient tolerated treatment well    Behavior During Therapy WFL for tasks assessed/performed              Past Medical History:  Diagnosis Date   Arthritis    hands and back   Hypertension    Past Surgical History:  Procedure Laterality Date   COLONOSCOPY N/A 09/08/2012   Procedure: COLONOSCOPY;  Surgeon: Rogene Houston, MD;  [facility];  Service: Endoscopy;  Laterality: N/A;  730-moved to 830 Ann to notify pt   COLONOSCOPY N/A 01/05/2019   Procedure: COLONOSCOPY;  Surgeon: Rogene Houston, MD;  [facility];  Service: Endoscopy;  Laterality: N/A;  100   JOINT REPLACEMENT  2008   left hip   POLYPECTOMY  01/05/2019   Procedure: POLYPECTOMY;  Surgeon: Rogene Houston, MD;  [facility];  Service: Endoscopy;;  colon   TOTAL HIP ARTHROPLASTY Left Left Hip Replacement   Patient Active Problem List   Diagnosis Date Noted   Near syncope 02/08/2020   Pre-syncope 02/07/2020   HTN (hypertension) 02/07/2020   Hx of colonic polyps 11/11/2018   Family hx of colon cancer 11/11/2018    PCP: Asencion Noble, MD  REFERRING PROVIDER:   Honor Junes, MD    REFERRING DIAG: [phone] (ICD-10-CM) - Post-traumatic osteoarthritis, left shoulder   THERAPY DIAG:  Post-traumatic osteoarthritis of left shoulder  Chronic left shoulder pain  Stiffness of left shoulder, not elsewhere classified  Rationale for Evaluation and Treatment Rehabilitation  ONSET DATE:  04/505/2023  SUBJECTIVE:                                                                                                                                                                                      SUBJECTIVE STATEMENT: 11/19/21:  Reports shoulder is feeling good today, no reports of pain.  Arrived Friday night back from [place] trip, had a wonderful time.  Admits she hasn't done as much ROM based exercises while on trip, has been working on grip strength.    [place] last [place]; fracture pelvis, tailbone and left shoulder; continued pain. Went to [facility] ER after her fall. When she had continued pain and difficulty he recommended rTSA; she reports some pain in her right non surgical shoulder today.    PERTINENT HISTORY: S/P reverse total shoulder arthroplasty, left 08/07/2021  Nontraumatic rupture of left long head biceps tendon   Post-traumatic osteoarthritis of left shoulder   Rotator cuff dysfunction, left   Nontraumatic rupture of left long head biceps tendon [M66.322]   Post-traumatic osteoarthritis of left shoulder [M19.112]   Rotator cuff dysfunction, left [M67.912]     Procedures   PR RECONSTR TOTAL SHOULDER IMPLANT   TOTAL SHOULDER REVERSE ARTHROPLASTY    Previous left shoulder fracture 5 years ago  osteoporosis  PAIN:  Are you having pain? No  PRECAUTIONS: Fall  WEIGHT BEARING RESTRICTIONS No  FALLS:  Has patient fallen in last 6 months? Yes. Number of falls 1  LIVING ENVIRONMENT: Lives with: lives alone Lives in: House/apartment Stairs: Yes: Internal: 13 steps; can reach both and External: 4 steps; none Has following equipment at home: Single point cane, Walker - 2 wheeled, Wheelchair (manual), shower chair, bed side commode, and Ramped entry  OCCUPATION: retired  PLOF: [place] return to plof  OBJECTIVE:   DIAGNOSTIC FINDINGS:  Done at [place]:  Quick Dash 42  COGNITION:  Overall  cognitive status: Within functional limits for tasks assessed       UPPER EXTREMITY ROM:   Active ROM Right eval Left eval Left PROM 11/19/21  Shoulder flexion Sitting 160 ~40 deg with substitution in sitting Supine 74 degrees (scaption) 89  Shoulder extension     Shoulder abduction   AROM 70, PROM 85  Shoulder adduction     Shoulder internal rotation  To approximate trunk   Shoulder external rotation  30   Elbow flexion  wfl   Elbow extension  wfl   Wrist flexion     Wrist extension     Wrist ulnar deviation     Wrist radial deviation     Wrist pronation     Wrist supination     (Blank rows = not tested)  UPPER EXTREMITY MMT:  MMT Right eval Left eval  Shoulder flexion    Shoulder extension    Shoulder abduction    Shoulder adduction    Shoulder internal rotation    Shoulder external rotation    Middle trapezius    Lower trapezius    Elbow flexion  3+  Elbow extension  3+  Wrist flexion    Wrist extension    Wrist ulnar deviation    Wrist radial deviation    Wrist pronation    Wrist supination    Grip strength (lbs) right hand dominant 43# 25#  (Blank rows = not tested)   PALPATION:  Soreness only anterior shoulder   TODAY'S TREATMENT:  11/19/21: Reviewed goals Reviewed HEP with cueing Supine: Dowel AAROM flexion and ER AAROM with Rt UE  PROM 10x 10" all directions   Seated: Scapular retraction Chin tuck 10x cueing for mechanics Pully 2' flex and abd  Table slide 5x10" for flexion and abd  Physical therapy evaluation and HEP instruction   PATIENT EDUCATION:  Education details: Patient educated on exam findings, POC, scope of PT, HEP.  Person educated: Patient Education method: Explanation, Demonstration, and Handouts Education comprehension: verbalized understanding, returned demonstration, verbal cues required, and tactile cues required   HOME EXERCISE PROGRAM: Access  Code: [email or web address] URL: [email or web address] Date:  11/07/2021 Prepared by: [facility]  Exercises - Supine Shoulder Flexion with Dowel  - 2 x daily - 7 x weekly - 1 sets - 10 reps - Supine Shoulder Flexion AAROM with Hands Clasped  - 2 x daily - 7 x weekly - 1 sets - 10 reps - Supine Shoulder External Rotation in 45 Degrees Abduction AAROM with Dowel  - 2 x daily - 7 x weekly - 1 sets - 10 reps - Putty Squeezes  - 1 x daily - 7 x weekly - 1 sets - 1 reps - 5 min hold  ASSESSMENT:  CLINICAL IMPRESSION: Reviewed goals, educated importance of HEP compliance for maximal benefits, pt able to recall with min cueing to improve Lt shoulder mobility and reduce compensation with trunk sidebending.  Session focus on shoulder mobility per protocol.  Due to impaired ROM pt at Phase III.  PROM complete per tolerance and added postural strengthening exercises to POC with cueing to improve cervical retraction.  Cueing to reduce compensation with UT, verbal and tactile cueing.  OBJECTIVE IMPAIRMENTS decreased activity tolerance, decreased endurance, decreased mobility, decreased ROM, decreased strength, hypomobility, increased fascial restrictions, impaired perceived functional ability, impaired flexibility, impaired UE functional use, and pain.   ACTIVITY LIMITATIONS carrying, lifting, bathing, toileting, dressing, reach over head, and hygiene/grooming  PARTICIPATION LIMITATIONS: meal prep, cleaning, laundry, driving, shopping, and community activity  PERSONAL FACTORS Age and 1 comorbidity: osteoporosis  are also affecting patient's functional outcome.   REHAB POTENTIAL: Good  CLINICAL DECISION MAKING: Stable/uncomplicated  EVALUATION COMPLEXITY: Low   GOALS: Goals reviewed with patient? No  SHORT TERM GOALS: Target date: 11/28/2021    1.   patient will be independent with initial HEP Baseline: Goal status: IN PROGRESS  2.  Patient will improve left shoulder AROM flexion in supine to 90 degrees to improve ability to reach in the refrigerator with  her left hand to reach sandwich making ingredients. Baseline: 74 Goal status: IN PROGRESS  LONG TERM GOALS: Target date: 12/12/2021  1. Patient will be independent in self management strategies to improve quality of life and functional outcomes.  Baseline:  Goal status: IN PROGRESS  2.  Patient will be able to actively raise left shoulder to 90 degrees in sitting to improve ability to reach into cabinets in the kitchen with left hand.  Baseline: ~40 Goal status: IN PROGRESS  3.  Patient will improve her qDASH score to 25% or less to demonstrate improved functional mobility.   Baseline: 41 Goal status: IN PROGRESS  4.  Patient will increase left grip strength by 5#'s to improve ability to open jars in the kitchen.  Baseline: 25# Goal status: IN PROGRESS    PLAN: PT FREQUENCY: 2x/week  PT DURATION: 4 weeks  PLANNED INTERVENTIONS: Therapeutic exercises, Therapeutic activity, Neuromuscular re-education, Balance training, Gait training, Patient/Family education, Joint manipulation, Joint mobilization, Stair training, Orthotic/Fit training, DME instructions, Aquatic Therapy, Dry Needling, Electrical stimulation, Spinal manipulation, Spinal mobilization, Cryotherapy, Moist heat, Compression bandaging, scar mobilization, Splintting, Taping, Traction, Ultrasound, Ionotophoresis '4mg'$ /ml Dexamethasone, and Manual therapy   PLAN FOR NEXT SESSION: Review HEP and goals; pulley; left shoulder strengthening and mobility per protocol; she is 3 months s/p left rTSA.   Add wall slides.  Ihor Austin, LPTA/CLT; CBIS [phone]  6:08 PM, 11/19/21

## 2021-11-25 DIAGNOSIS — Z96612 Presence of left artificial shoulder joint: Secondary | ICD-10-CM | POA: Diagnosis not present

## 2021-11-25 DIAGNOSIS — Z4789 Encounter for other orthopedic aftercare: Secondary | ICD-10-CM | POA: Diagnosis not present

## 2021-11-27 ENCOUNTER — Ambulatory Visit (HOSPITAL_COMMUNITY): Payer: Medicare Other | Admitting: Physical Therapy

## 2021-11-27 DIAGNOSIS — M25612 Stiffness of left shoulder, not elsewhere classified: Secondary | ICD-10-CM | POA: Diagnosis not present

## 2021-11-27 DIAGNOSIS — G8929 Other chronic pain: Secondary | ICD-10-CM | POA: Diagnosis not present

## 2021-11-27 DIAGNOSIS — M25512 Pain in left shoulder: Secondary | ICD-10-CM | POA: Diagnosis not present

## 2021-11-27 DIAGNOSIS — M19112 Post-traumatic osteoarthritis, left shoulder: Secondary | ICD-10-CM | POA: Diagnosis not present

## 2021-11-27 DIAGNOSIS — R29898 Other symptoms and signs involving the musculoskeletal system: Secondary | ICD-10-CM | POA: Diagnosis not present

## 2021-11-27 NOTE — Therapy (Signed)
OUTPATIENT PHYSICAL THERAPY SHOULDER TREATMENT   Patient Name: Carol Gutierrez MRN: [id number] DOB:1935/10/08, 86 y.o., female Today's Date: 11/27/2021   PT End of Session - 11/27/21 1126     Visit Number 3    Number of Visits 8    Date for PT Re-Evaluation 12/12/21    Authorization Type Medicare Part A and AARP suppliment    Progress Note Due on Visit 10    PT Start Time 1125    PT Stop Time 1155    PT Time Calculation (min) 30 min    Activity Tolerance Patient tolerated treatment well    Behavior During Therapy WFL for tasks assessed/performed              Past Medical History:  Diagnosis Date   Arthritis    hands and back   Hypertension    Past Surgical History:  Procedure Laterality Date   COLONOSCOPY N/A 09/08/2012   Procedure: COLONOSCOPY;  Surgeon: Rogene Houston, MD;  [facility];  Service: Endoscopy;  Laterality: N/A;  730-moved to 830 Ann to notify pt   COLONOSCOPY N/A 01/05/2019   Procedure: COLONOSCOPY;  Surgeon: Rogene Houston, MD;  [facility];  Service: Endoscopy;  Laterality: N/A;  100   JOINT REPLACEMENT  2008   left hip   POLYPECTOMY  01/05/2019   Procedure: POLYPECTOMY;  Surgeon: Rogene Houston, MD;  [facility];  Service: Endoscopy;;  colon   TOTAL HIP ARTHROPLASTY Left Left Hip Replacement   Patient Active Problem List   Diagnosis Date Noted   Near syncope 02/08/2020   Pre-syncope 02/07/2020   HTN (hypertension) 02/07/2020   Hx of colonic polyps 11/11/2018   Family hx of colon cancer 11/11/2018    PCP: Asencion Noble, MD  REFERRING PROVIDER:   Honor Junes, MD    REFERRING DIAG: [phone] (ICD-10-CM) - Post-traumatic osteoarthritis, left shoulder   THERAPY DIAG:  Chronic left shoulder pain  Stiffness of left shoulder, not elsewhere classified  Rationale for Evaluation and Treatment Rehabilitation  ONSET DATE: 04/505/2023  SUBJECTIVE:                                                                                                                                                                                       SUBJECTIVE STATEMENT: 11/19/21:  Reports shoulder is feeling good today, no reports of pain.  Arrived Friday night back from [facility] trip, had a wonderful time.  Admits she hasn't done as much ROM based exercises while on trip, has been working on grip strength.    [place] last [place]; fracture  pelvis, tailbone and left shoulder; continued pain. Went to [facility] ER after her fall. When she had continued pain and difficulty he recommended rTSA; she reports some pain in her right non surgical shoulder today.    PERTINENT HISTORY: S/P reverse total shoulder arthroplasty, left 08/07/2021  Nontraumatic rupture of left long head biceps tendon   Post-traumatic osteoarthritis of left shoulder   Rotator cuff dysfunction, left   Nontraumatic rupture of left long head biceps tendon [M66.322]   Post-traumatic osteoarthritis of left shoulder [M19.112]   Rotator cuff dysfunction, left [M67.912]     Procedures   PR RECONSTR TOTAL SHOULDER IMPLANT   TOTAL SHOULDER REVERSE ARTHROPLASTY    Previous left shoulder fracture 5 years ago  osteoporosis  PAIN:  Are you having pain? No  PRECAUTIONS: Fall  WEIGHT BEARING RESTRICTIONS No  FALLS:  Has patient fallen in last 6 months? Yes. Number of falls 1  LIVING ENVIRONMENT: Lives with: lives alone Lives in: House/apartment Stairs: Yes: Internal: 13 steps; can reach both and External: 4 steps; none Has following equipment at home: Single point cane, Walker - 2 wheeled, Wheelchair (manual), shower chair, bed side commode, and Ramped entry  OCCUPATION: retired  PLOF: [place] return to plof  OBJECTIVE:   DIAGNOSTIC FINDINGS:  Done at [place]:  Quick Dash 42  COGNITION:  Overall cognitive status: Within functional limits for tasks  assessed       UPPER EXTREMITY ROM:   Active ROM Right eval Left eval Left PROM 11/19/21  Shoulder flexion Sitting 160 ~40 deg with substitution in sitting Supine 74 degrees (scaption) 89  Shoulder extension     Shoulder abduction   AROM 70, PROM 85  Shoulder adduction     Shoulder internal rotation  To approximate trunk   Shoulder external rotation  30   Elbow flexion  wfl   Elbow extension  wfl   Wrist flexion     Wrist extension     Wrist ulnar deviation     Wrist radial deviation     Wrist pronation     Wrist supination     (Blank rows = not tested)  UPPER EXTREMITY MMT:  MMT Right eval Left eval  Shoulder flexion    Shoulder extension    Shoulder abduction    Shoulder adduction    Shoulder internal rotation    Shoulder external rotation    Middle trapezius    Lower trapezius    Elbow flexion  3+  Elbow extension  3+  Wrist flexion    Wrist extension    Wrist ulnar deviation    Wrist radial deviation    Wrist pronation    Wrist supination    Grip strength (lbs) right hand dominant 43# 25#  (Blank rows = not tested)   PALPATION:  Soreness only anterior shoulder   TODAY'S TREATMENT:  11/27/21 Manual LT GHJ PROM flexion/ abduction/ ER Manual STM to LT periscapular muscles and upper trap (deltoid, teres, trapezius)  Sidelying shoulder ER x15 Sidelying shoulder abduction x15 Wall slides  UBE 2/2 lv1  11/19/21: Reviewed goals Reviewed HEP with cueing Supine: Dowel AAROM flexion and ER AAROM with Rt UE  PROM 10x 10" all directions   Seated: Scapular retraction Chin tuck 10x cueing for mechanics Pully 2' flex and abd  Table slide 5x10" for flexion and abd  Physical therapy evaluation and HEP instruction   PATIENT EDUCATION:  Education details: Patient educated on exam findings, POC, scope of PT,  HEP.  Person educated: Patient Education method: Explanation, Demonstration, and Handouts Education comprehension: verbalized understanding,  returned demonstration, verbal cues required, and tactile cues required   HOME EXERCISE PROGRAM: Access Code: [email or web address] URL: [email or web address] Date: 11/07/2021 Prepared by: [facility]  Exercises - Supine Shoulder Flexion with Dowel  - 2 x daily - 7 x weekly - 1 sets - 10 reps - Supine Shoulder Flexion AAROM with Hands Clasped  - 2 x daily - 7 x weekly - 1 sets - 10 reps - Supine Shoulder External Rotation in 45 Degrees Abduction AAROM with Dowel  - 2 x daily - 7 x weekly - 1 sets - 10 reps - Putty Squeezes  - 1 x daily - 7 x weekly - 1 sets - 1 reps - 5 min hold  ASSESSMENT:  CLINICAL IMPRESSION: Patein tolerated session well today. Added manual PROM with good result. Patein able to reach 82 degrees active shoulder abduction following manual. Patient requires verbal cues for proper arm positioning during sidelying shoulder ER, due to weakness, to avoid compensation. Patient will continue to benefit from skilled therapy services to reduce remaining deficits and improve functional ability.    OBJECTIVE IMPAIRMENTS decreased activity tolerance, decreased endurance, decreased mobility, decreased ROM, decreased strength, hypomobility, increased fascial restrictions, impaired perceived functional ability, impaired flexibility, impaired UE functional use, and pain.   ACTIVITY LIMITATIONS carrying, lifting, bathing, toileting, dressing, reach over head, and hygiene/grooming  PARTICIPATION LIMITATIONS: meal prep, cleaning, laundry, driving, shopping, and community activity  PERSONAL FACTORS Age and 1 comorbidity: osteoporosis  are also affecting patient's functional outcome.   REHAB POTENTIAL: Good  CLINICAL DECISION MAKING: Stable/uncomplicated  EVALUATION COMPLEXITY: Low   GOALS: Goals reviewed with patient? No  SHORT TERM GOALS: Target date: 11/28/2021    1.   patient will be independent with initial HEP Baseline: Goal status: IN PROGRESS  2.  Patient will  improve left shoulder AROM flexion in supine to 90 degrees to improve ability to reach in the refrigerator with her left hand to reach sandwich making ingredients. Baseline: 74 Goal status: IN PROGRESS  LONG TERM GOALS: Target date: 12/12/2021  1. Patient will be independent in self management strategies to improve quality of life and functional outcomes.  Baseline:  Goal status: IN PROGRESS  2.  Patient will be able to actively raise left shoulder to 90 degrees in sitting to improve ability to reach into cabinets in the kitchen with left hand.  Baseline: ~40 Goal status: IN PROGRESS  3.  Patient will improve her qDASH score to 25% or less to demonstrate improved functional mobility.   Baseline: 41 Goal status: IN PROGRESS  4.  Patient will increase left grip strength by 5#'s to improve ability to open jars in the kitchen.  Baseline: 25# Goal status: IN PROGRESS    PLAN: PT FREQUENCY: 2x/week  PT DURATION: 4 weeks  PLANNED INTERVENTIONS: Therapeutic exercises, Therapeutic activity, Neuromuscular re-education, Balance training, Gait training, Patient/Family education, Joint manipulation, Joint mobilization, Stair training, Orthotic/Fit training, DME instructions, Aquatic Therapy, Dry Needling, Electrical stimulation, Spinal manipulation, Spinal mobilization, Cryotherapy, Moist heat, Compression bandaging, scar mobilization, Splintting, Taping, Traction, Ultrasound, Ionotophoresis '4mg'$ /ml Dexamethasone, and Manual therapy   PLAN FOR NEXT SESSION: pulley; left shoulder strengthening and mobility per protocol; she is 3 months s/p left rTSA.    11:27 AM, 11/27/21 Josue Hector PT DPT  Physical Therapist with [facility]  [phone]

## 2021-11-28 DIAGNOSIS — Z01818 Encounter for other preprocedural examination: Secondary | ICD-10-CM | POA: Diagnosis not present

## 2021-11-28 DIAGNOSIS — B961 Klebsiella pneumoniae [K. pneumoniae] as the cause of diseases classified elsewhere: Secondary | ICD-10-CM | POA: Diagnosis not present

## 2021-11-28 DIAGNOSIS — N3946 Mixed incontinence: Secondary | ICD-10-CM | POA: Diagnosis not present

## 2021-11-28 DIAGNOSIS — N390 Urinary tract infection, site not specified: Secondary | ICD-10-CM | POA: Diagnosis not present

## 2021-11-29 ENCOUNTER — Ambulatory Visit (HOSPITAL_COMMUNITY): Payer: Medicare Other

## 2021-11-29 DIAGNOSIS — R29898 Other symptoms and signs involving the musculoskeletal system: Secondary | ICD-10-CM | POA: Diagnosis not present

## 2021-11-29 DIAGNOSIS — M19112 Post-traumatic osteoarthritis, left shoulder: Secondary | ICD-10-CM | POA: Diagnosis not present

## 2021-11-29 DIAGNOSIS — M25512 Pain in left shoulder: Secondary | ICD-10-CM | POA: Diagnosis not present

## 2021-11-29 DIAGNOSIS — M25612 Stiffness of left shoulder, not elsewhere classified: Secondary | ICD-10-CM | POA: Diagnosis not present

## 2021-11-29 DIAGNOSIS — G8929 Other chronic pain: Secondary | ICD-10-CM | POA: Diagnosis not present

## 2021-11-29 NOTE — Therapy (Signed)
OUTPATIENT PHYSICAL THERAPY SHOULDER TREATMENT   Patient Name: Carol Gutierrez MRN: [id number] DOB:1936-03-29, 86 y.o., female Today's Date: 11/29/2021   PT End of Session - 11/29/21 1200     Visit Number 4    Number of Visits 8    Date for PT Re-Evaluation 12/12/21    Authorization Type Medicare Part A and AARP suppliment    Progress Note Due on Visit 10    PT Start Time 1120    PT Stop Time 1200    PT Time Calculation (min) 40 min    Activity Tolerance Patient tolerated treatment well    Behavior During Therapy WFL for tasks assessed/performed               Past Medical History:  Diagnosis Date   Arthritis    hands and back   Hypertension    Past Surgical History:  Procedure Laterality Date   COLONOSCOPY N/A 09/08/2012   Procedure: COLONOSCOPY;  Surgeon: Rogene Houston, MD;  [facility];  Service: Endoscopy;  Laterality: N/A;  730-moved to 830 Ann to notify pt   COLONOSCOPY N/A 01/05/2019   Procedure: COLONOSCOPY;  Surgeon: Rogene Houston, MD;  [facility];  Service: Endoscopy;  Laterality: N/A;  100   JOINT REPLACEMENT  2008   left hip   POLYPECTOMY  01/05/2019   Procedure: POLYPECTOMY;  Surgeon: Rogene Houston, MD;  [facility];  Service: Endoscopy;;  colon   TOTAL HIP ARTHROPLASTY Left Left Hip Replacement   Patient Active Problem List   Diagnosis Date Noted   Near syncope 02/08/2020   Pre-syncope 02/07/2020   HTN (hypertension) 02/07/2020   Hx of colonic polyps 11/11/2018   Family hx of colon cancer 11/11/2018    PCP: Asencion Noble, MD  REFERRING PROVIDER:   Honor Junes, MD    REFERRING DIAG: [phone] (ICD-10-CM) - Post-traumatic osteoarthritis, left shoulder   THERAPY DIAG:  Chronic left shoulder pain  Stiffness of left shoulder, not elsewhere classified  Post-traumatic osteoarthritis of left shoulder  Acute pain of left shoulder  Other symptoms and signs involving the musculoskeletal  system  Rationale for Evaluation and Treatment Rehabilitation  ONSET DATE: 04/505/2023  SUBJECTIVE:                                                                                                                                                                                      SUBJECTIVE STATEMENT: My shoulder is feeling good today! Much easier getting dressed   Eval:  Fell back in October of last year; fracture pelvis, tailbone and left shoulder; continued pain. Went to [facility] ER after her  fall. When she had continued pain and difficulty he recommended rTSA; she reports some pain in her right non surgical shoulder today.    PERTINENT HISTORY: S/P reverse total shoulder arthroplasty, left 08/07/2021  Nontraumatic rupture of left long head biceps tendon   Post-traumatic osteoarthritis of left shoulder   Rotator cuff dysfunction, left   Nontraumatic rupture of left long head biceps tendon [M66.322]   Post-traumatic osteoarthritis of left shoulder [M19.112]   Rotator cuff dysfunction, left [M67.912]     Procedures   PR RECONSTR TOTAL SHOULDER IMPLANT   TOTAL SHOULDER REVERSE ARTHROPLASTY    Previous left shoulder fracture 5 years ago  osteoporosis  PAIN:  Are you having pain? No  PRECAUTIONS: Fall  WEIGHT BEARING RESTRICTIONS No  FALLS:  Has patient fallen in last 6 months? Yes. Number of falls 1  LIVING ENVIRONMENT: Lives with: lives alone Lives in: House/apartment Stairs: Yes: Internal: 13 steps; can reach both and External: 4 steps; none Has following equipment at home: Single point cane, Walker - 2 wheeled, Wheelchair (manual), shower chair, bed side commode, and Ramped entry  OCCUPATION: retired  PLOF: [place] return to plof  OBJECTIVE:   DIAGNOSTIC FINDINGS:  Done at [place]:  Quick Dash 42  COGNITION:  Overall cognitive status: Within functional limits for tasks assessed       UPPER EXTREMITY ROM:   Active  ROM Right eval Left eval Left PROM 11/19/21  Shoulder flexion Sitting 160 ~40 deg with substitution in sitting Supine 74 degrees (scaption) 89  Shoulder extension     Shoulder abduction   AROM 70, PROM 85  Shoulder adduction     Shoulder internal rotation  To approximate trunk   Shoulder external rotation  30   Elbow flexion  wfl   Elbow extension  wfl   Wrist flexion     Wrist extension     Wrist ulnar deviation     Wrist radial deviation     Wrist pronation     Wrist supination     (Blank rows = not tested)  UPPER EXTREMITY MMT:  MMT Right eval Left eval  Shoulder flexion    Shoulder extension    Shoulder abduction    Shoulder adduction    Shoulder internal rotation    Shoulder external rotation    Middle trapezius    Lower trapezius    Elbow flexion  3+  Elbow extension  3+  Wrist flexion    Wrist extension    Wrist ulnar deviation    Wrist radial deviation    Wrist pronation    Wrist supination    Grip strength (lbs) right hand dominant 43# 25#  (Blank rows = not tested)   PALPATION:  Soreness only anterior shoulder   TODAY'S TREATMENT:  UBE 2/2 level 1 Pulley 2' flexion 2' abduction  Manual PROM left shoulder for flexion/abduction/ER x 10" Mat  Supine left shoulder flexion 2 x 10 Side lying left shoulder external rotation 2 x 10 Side lying left shoulder abduction 2 x 10  Ball roll on mat table up wedge x 2'  Wall slides x 10  Seated shoulder overhead punches with assist x 5 Seated AROM left shoulder scaption 70 degrees with substition       11/27/21 Manual LT GHJ PROM flexion/ abduction/ ER Manual STM to LT periscapular muscles and upper trap (deltoid, teres, trapezius)  Sidelying shoulder ER x15 Sidelying shoulder abduction x15 Wall slides  UBE 2/2 lv1  11/19/21: Reviewed goals Reviewed HEP with cueing Supine: Dowel AAROM flexion and ER AAROM with Rt UE  PROM 10x 10" all directions   Seated: Scapular retraction Chin tuck  10x cueing for mechanics Pully 2' flex and abd  Table slide 5x10" for flexion and abd  Physical therapy evaluation and HEP instruction   PATIENT EDUCATION:  Education details: Patient educated on exam findings, POC, scope of PT, HEP.  Person educated: Patient Education method: Explanation, Demonstration, and Handouts Education comprehension: verbalized understanding, returned demonstration, verbal cues required, and tactile cues required   HOME EXERCISE PROGRAM: Access Code: [email or web address] URL: [email or web address] Date: 11/07/2021 Prepared by: [facility] - [facility]  Exercises - Supine Shoulder Flexion with Dowel  - 2 x daily - 7 x weekly - 1 sets - 10 reps - Supine Shoulder Flexion AAROM with Hands Clasped  - 2 x daily - 7 x weekly - 1 sets - 10 reps - Supine Shoulder External Rotation in 45 Degrees Abduction AAROM with Dowel  - 2 x daily - 7 x weekly - 1 sets - 10 reps - Putty Squeezes  - 1 x daily - 7 x weekly - 1 sets - 1 reps - 5 min hold  ASSESSMENT:  CLINICAL IMPRESSION: Today's session continued to focus on left arm strength and shoulder mobility. continued substitution with trying to raise left arm but noted improved arom range in sitting. Patient will continue to benefit from skilled therapy services to reduce remaining deficits and improve functional ability.    OBJECTIVE IMPAIRMENTS decreased activity tolerance, decreased endurance, decreased mobility, decreased ROM, decreased strength, hypomobility, increased fascial restrictions, impaired perceived functional ability, impaired flexibility, impaired UE functional use, and pain.   ACTIVITY LIMITATIONS carrying, lifting, bathing, toileting, dressing, reach over head, and hygiene/grooming  PARTICIPATION LIMITATIONS: meal prep, cleaning, laundry, driving, shopping, and community activity  PERSONAL FACTORS Age and 1 comorbidity: osteoporosis  are also affecting patient's functional outcome.   REHAB POTENTIAL:  Good  CLINICAL DECISION MAKING: Stable/uncomplicated  EVALUATION COMPLEXITY: Low   GOALS: Goals reviewed with patient? No  SHORT TERM GOALS: Target date: 11/28/2021    1.   patient will be independent with initial HEP Baseline: Goal status: IN PROGRESS  2.  Patient will improve left shoulder AROM flexion in supine to 90 degrees to improve ability to reach in the refrigerator with her left hand to reach sandwich making ingredients. Baseline: 74 Goal status: IN PROGRESS  LONG TERM GOALS: Target date: 12/12/2021  1. Patient will be independent in self management strategies to improve quality of life and functional outcomes.  Baseline:  Goal status: IN PROGRESS  2.  Patient will be able to actively raise left shoulder to 90 degrees in sitting to improve ability to reach into cabinets in the kitchen with left hand.  Baseline: ~40 Goal status: IN PROGRESS  3.  Patient will improve her qDASH score to 25% or less to demonstrate improved functional mobility.   Baseline: 41 Goal status: IN PROGRESS  4.  Patient will increase left grip strength by 5#'s to improve ability to open jars in the kitchen.  Baseline: 25# Goal status: IN PROGRESS    PLAN: PT FREQUENCY: 2x/week  PT DURATION: 4 weeks  PLANNED INTERVENTIONS: Therapeutic exercises, Therapeutic activity, Neuromuscular re-education, Balance training, Gait training, Patient/Family education, Joint manipulation, Joint mobilization, Stair training, Orthotic/Fit training, DME instructions, Aquatic Therapy, Dry Needling, Electrical stimulation, Spinal manipulation, Spinal mobilization, Cryotherapy, Moist heat, Compression bandaging, scar mobilization, Splintting, Taping, Traction, Ultrasound, Ionotophoresis '4mg'$ /ml Dexamethasone, and Manual  therapy   PLAN FOR NEXT SESSION: pulley; left shoulder strengthening and mobility per protocol; she is 3 months s/p left rTSA.    12:01 PM, 11/29/21 Catelynn Sparger Small Torrin Crihfield MPT [facility] [place] [phone]

## 2021-12-02 DIAGNOSIS — Z961 Presence of intraocular lens: Secondary | ICD-10-CM | POA: Diagnosis not present

## 2021-12-02 DIAGNOSIS — H26492 Other secondary cataract, left eye: Secondary | ICD-10-CM | POA: Diagnosis not present

## 2021-12-02 DIAGNOSIS — H04123 Dry eye syndrome of bilateral lacrimal glands: Secondary | ICD-10-CM | POA: Diagnosis not present

## 2021-12-02 DIAGNOSIS — H40013 Open angle with borderline findings, low risk, bilateral: Secondary | ICD-10-CM | POA: Diagnosis not present

## 2021-12-03 ENCOUNTER — Ambulatory Visit (HOSPITAL_COMMUNITY): Payer: Medicare Other | Attending: Sports Medicine

## 2021-12-03 DIAGNOSIS — M25512 Pain in left shoulder: Secondary | ICD-10-CM | POA: Diagnosis not present

## 2021-12-03 DIAGNOSIS — R29898 Other symptoms and signs involving the musculoskeletal system: Secondary | ICD-10-CM | POA: Diagnosis not present

## 2021-12-03 DIAGNOSIS — M19112 Post-traumatic osteoarthritis, left shoulder: Secondary | ICD-10-CM | POA: Diagnosis not present

## 2021-12-03 DIAGNOSIS — M25612 Stiffness of left shoulder, not elsewhere classified: Secondary | ICD-10-CM | POA: Diagnosis not present

## 2021-12-03 DIAGNOSIS — G8929 Other chronic pain: Secondary | ICD-10-CM | POA: Insufficient documentation

## 2021-12-03 NOTE — Therapy (Signed)
OUTPATIENT PHYSICAL THERAPY SHOULDER TREATMENT   Patient Name: Carol Gutierrez MRN: [id number] DOB:06-28-1935, 86 y.o., female Today's Date: 12/03/2021   PT End of Session - 12/03/21 1528     Visit Number 5    Number of Visits 8    Date for PT Re-Evaluation 12/12/21    Authorization Type Medicare Part A and AARP suppliment    Progress Note Due on Visit 10    PT Start Time 1526   late   PT Stop Time 1559    PT Time Calculation (min) 33 min    Activity Tolerance Patient tolerated treatment well    Behavior During Therapy WFL for tasks assessed/performed                Past Medical History:  Diagnosis Date   Arthritis    hands and back   Hypertension    Past Surgical History:  Procedure Laterality Date   COLONOSCOPY N/A 09/08/2012   Procedure: COLONOSCOPY;  Surgeon: Rogene Houston, MD;  [facility];  Service: Endoscopy;  Laterality: N/A;  730-moved to 830 Ann to notify pt   COLONOSCOPY N/A 01/05/2019   Procedure: COLONOSCOPY;  Surgeon: Rogene Houston, MD;  [facility];  Service: Endoscopy;  Laterality: N/A;  100   JOINT REPLACEMENT  2008   left hip   POLYPECTOMY  01/05/2019   Procedure: POLYPECTOMY;  Surgeon: Rogene Houston, MD;  [facility];  Service: Endoscopy;;  colon   TOTAL HIP ARTHROPLASTY Left Left Hip Replacement   Patient Active Problem List   Diagnosis Date Noted   Near syncope 02/08/2020   Pre-syncope 02/07/2020   HTN (hypertension) 02/07/2020   Hx of colonic polyps 11/11/2018   Family hx of colon cancer 11/11/2018    PCP: Asencion Noble, MD  REFERRING PROVIDER:   Honor Junes, MD    REFERRING DIAG: [phone] (ICD-10-CM) - Post-traumatic osteoarthritis, left shoulder   THERAPY DIAG:  Chronic left shoulder pain  Stiffness of left shoulder, not elsewhere classified  Post-traumatic osteoarthritis of left shoulder  Acute pain of left shoulder  Rationale for Evaluation and Treatment  Rehabilitation  ONSET DATE: 04/505/2023  SUBJECTIVE:                                                                                                                                                                                      SUBJECTIVE STATEMENT: No pain today; no new issues   Eval:  Fell back in October of last year; fracture pelvis, tailbone and left shoulder; continued pain. Went to [facility] ER after her fall. When she had continued pain and difficulty he recommended  rTSA; she reports some pain in her right non surgical shoulder today.    PERTINENT HISTORY: S/P reverse total shoulder arthroplasty, left 08/07/2021  Nontraumatic rupture of left long head biceps tendon   Post-traumatic osteoarthritis of left shoulder   Rotator cuff dysfunction, left   Nontraumatic rupture of left long head biceps tendon [M66.322]   Post-traumatic osteoarthritis of left shoulder [M19.112]   Rotator cuff dysfunction, left [M67.912]     Procedures   PR RECONSTR TOTAL SHOULDER IMPLANT   TOTAL SHOULDER REVERSE ARTHROPLASTY    Previous left shoulder fracture 5 years ago  osteoporosis  PAIN:  Are you having pain? No  PRECAUTIONS: Fall  WEIGHT BEARING RESTRICTIONS No  FALLS:  Has patient fallen in last 6 months? Yes. Number of falls 1  LIVING ENVIRONMENT: Lives with: lives alone Lives in: House/apartment Stairs: Yes: Internal: 13 steps; can reach both and External: 4 steps; none Has following equipment at home: Single point cane, Walker - 2 wheeled, Wheelchair (manual), shower chair, bed side commode, and Ramped entry  OCCUPATION: retired  PLOF: [place] return to plof  OBJECTIVE:   DIAGNOSTIC FINDINGS:  Done at [place]:  Quick Dash 42  COGNITION:  Overall cognitive status: Within functional limits for tasks assessed       UPPER EXTREMITY ROM:   Active ROM Right eval Left eval Left PROM 11/19/21  Shoulder flexion Sitting 160 ~40  deg with substitution in sitting Supine 74 degrees (scaption) 89  Shoulder extension     Shoulder abduction   AROM 70, PROM 85  Shoulder adduction     Shoulder internal rotation  To approximate trunk   Shoulder external rotation  30   Elbow flexion  wfl   Elbow extension  wfl   Wrist flexion     Wrist extension     Wrist ulnar deviation     Wrist radial deviation     Wrist pronation     Wrist supination     (Blank rows = not tested)  UPPER EXTREMITY MMT:  MMT Right eval Left eval  Shoulder flexion    Shoulder extension    Shoulder abduction    Shoulder adduction    Shoulder internal rotation    Shoulder external rotation    Middle trapezius    Lower trapezius    Elbow flexion  3+  Elbow extension  3+  Wrist flexion    Wrist extension    Wrist ulnar deviation    Wrist radial deviation    Wrist pronation    Wrist supination    Grip strength (lbs) right hand dominant 43# 25#  (Blank rows = not tested)   PALPATION:  Soreness only anterior shoulder   TODAY'S TREATMENT:  12/03/21 Pulley 2' flexion/ 2' abduction UBE 2' Forward/ 2' back  Standing dowel shoulder extension x 10 Standing dowel shoulder internal rotation x 10  Wall slides x 10  Seated shoulder overhead punches with assist 2 x 10  Mat flexion, abduction and external rotation 1 x 10        11/29/21 UBE 2/2 level 1 Pulley 2' flexion 2' abduction  Manual PROM left shoulder for flexion/abduction/ER x 10" Mat  Supine left shoulder flexion 2 x 10 Side lying left shoulder external rotation 2 x 10 Side lying left shoulder abduction 2 x 10  Ball roll on mat table up wedge x 2'  Wall slides x 10  Seated shoulder overhead punches with assist x 5 Seated AROM left  shoulder scaption 70 degrees with substition       11/27/21 Manual LT GHJ PROM flexion/ abduction/ ER Manual STM to LT periscapular muscles and upper trap (deltoid, teres, trapezius)  Sidelying shoulder ER x15 Sidelying shoulder  abduction x15 Wall slides  UBE 2/2 lv1  11/19/21: Reviewed goals Reviewed HEP with cueing Supine: Dowel AAROM flexion and ER AAROM with Rt UE  PROM 10x 10" all directions   Seated: Scapular retraction Chin tuck 10x cueing for mechanics Pully 2' flex and abd  Table slide 5x10" for flexion and abd  Physical therapy evaluation and HEP instruction   PATIENT EDUCATION:  Education details: Patient educated on exam findings, POC, scope of PT, HEP.  Person educated: Patient Education method: Explanation, Demonstration, and Handouts Education comprehension: verbalized understanding, returned demonstration, verbal cues required, and tactile cues required   HOME EXERCISE PROGRAM:  12/03/21 Dowel shoulder extension and external rotation Access Code: [email or web address] URL: [email or web address] Date: 11/07/2021 Prepared by: [facility]  Exercises - Supine Shoulder Flexion with Dowel  - 2 x daily - 7 x weekly - 1 sets - 10 reps - Supine Shoulder Flexion AAROM with Hands Clasped  - 2 x daily - 7 x weekly - 1 sets - 10 reps - Supine Shoulder External Rotation in 45 Degrees Abduction AAROM with Dowel  - 2 x daily - 7 x weekly - 1 sets - 10 reps - Putty Squeezes  - 1 x daily - 7 x weekly - 1 sets - 1 reps - 5 min hold  ASSESSMENT:  CLINICAL IMPRESSION: Continued to work on left arm functional mobility and strengthening.  Late arrival today shortened treatment.  Added dowel shoulder extension and IR to HEP. Patient will continue to benefit from skilled therapy services to reduce remaining deficits and improve functional ability.    OBJECTIVE IMPAIRMENTS decreased activity tolerance, decreased endurance, decreased mobility, decreased ROM, decreased strength, hypomobility, increased fascial restrictions, impaired perceived functional ability, impaired flexibility, impaired UE functional use, and pain.   ACTIVITY LIMITATIONS carrying, lifting, bathing, toileting, dressing, reach over  head, and hygiene/grooming  PARTICIPATION LIMITATIONS: meal prep, cleaning, laundry, driving, shopping, and community activity  PERSONAL FACTORS Age and 1 comorbidity: osteoporosis  are also affecting patient's functional outcome.   REHAB POTENTIAL: Good  CLINICAL DECISION MAKING: Stable/uncomplicated  EVALUATION COMPLEXITY: Low   GOALS: Goals reviewed with patient? No  SHORT TERM GOALS: Target date: 11/28/2021    1.   patient will be independent with initial HEP Baseline: Goal status: IN PROGRESS  2.  Patient will improve left shoulder AROM flexion in supine to 90 degrees to improve ability to reach in the refrigerator with her left hand to reach sandwich making ingredients. Baseline: 74 Goal status: IN PROGRESS  LONG TERM GOALS: Target date: 12/12/2021  1. Patient will be independent in self management strategies to improve quality of life and functional outcomes.  Baseline:  Goal status: IN PROGRESS  2.  Patient will be able to actively raise left shoulder to 90 degrees in sitting to improve ability to reach into cabinets in the kitchen with left hand.  Baseline: ~40 Goal status: IN PROGRESS  3.  Patient will improve her qDASH score to 25% or less to demonstrate improved functional mobility.   Baseline: 41 Goal status: IN PROGRESS  4.  Patient will increase left grip strength by 5#'s to improve ability to open jars in the kitchen.  Baseline: 25# Goal status: IN PROGRESS    PLAN: PT FREQUENCY: 2x/week  PT  DURATION: 4 weeks  PLANNED INTERVENTIONS: Therapeutic exercises, Therapeutic activity, Neuromuscular re-education, Balance training, Gait training, Patient/Family education, Joint manipulation, Joint mobilization, Stair training, Orthotic/Fit training, DME instructions, Aquatic Therapy, Dry Needling, Electrical stimulation, Spinal manipulation, Spinal mobilization, Cryotherapy, Moist heat, Compression bandaging, scar mobilization, Splintting, Taping, Traction,  Ultrasound, Ionotophoresis '4mg'$ /ml Dexamethasone, and Manual therapy   PLAN FOR NEXT SESSION: pulley; left shoulder strengthening and mobility per protocol; she is 3 months s/p left rTSA.    3:58 PM, 12/03/21 Ymani Porcher Small Maxine Fredman MPT [facility] [place] [phone]

## 2021-12-05 ENCOUNTER — Ambulatory Visit (HOSPITAL_COMMUNITY): Payer: Medicare Other

## 2021-12-05 DIAGNOSIS — M19112 Post-traumatic osteoarthritis, left shoulder: Secondary | ICD-10-CM

## 2021-12-05 DIAGNOSIS — M25512 Pain in left shoulder: Secondary | ICD-10-CM | POA: Diagnosis not present

## 2021-12-05 DIAGNOSIS — M25612 Stiffness of left shoulder, not elsewhere classified: Secondary | ICD-10-CM

## 2021-12-05 DIAGNOSIS — G8929 Other chronic pain: Secondary | ICD-10-CM

## 2021-12-05 DIAGNOSIS — R29898 Other symptoms and signs involving the musculoskeletal system: Secondary | ICD-10-CM | POA: Diagnosis not present

## 2021-12-05 NOTE — Therapy (Signed)
OUTPATIENT PHYSICAL THERAPY SHOULDER TREATMENT   Patient Name: Carol Gutierrez MRN: [id number] DOB:[birth year], 86 y.o., female Today's Date: 12/05/2021   PT End of Session - 12/05/21 1438     Visit Number 6    Number of Visits 8    Date for PT Re-Evaluation 12/12/21    Authorization Type Medicare Part A and AARP suppliment    Progress Note Due on Visit 10    PT Start Time 1432    PT Stop Time 1514    PT Time Calculation (min) 42 min    Activity Tolerance Patient tolerated treatment well    Behavior During Therapy WFL for tasks assessed/performed                 Past Medical History:  Diagnosis Date   Arthritis    hands and back   Hypertension    Past Surgical History:  Procedure Laterality Date   COLONOSCOPY N/A 09/08/2012   Procedure: COLONOSCOPY;  Surgeon: Rogene Houston, MD;  [facility];  Service: Endoscopy;  Laterality: N/A;  730-moved to 830 Ann to notify pt   COLONOSCOPY N/A 01/05/2019   Procedure: COLONOSCOPY;  Surgeon: Rogene Houston, MD;  [facility];  Service: Endoscopy;  Laterality: N/A;  100   JOINT REPLACEMENT  2008   left hip   POLYPECTOMY  01/05/2019   Procedure: POLYPECTOMY;  Surgeon: Rogene Houston, MD;  [facility];  Service: Endoscopy;;  colon   TOTAL HIP ARTHROPLASTY Left Left Hip Replacement   Patient Active Problem List   Diagnosis Date Noted   Near syncope 02/08/2020   Pre-syncope 02/07/2020   HTN (hypertension) 02/07/2020   Hx of colonic polyps 11/11/2018   Family hx of colon cancer 11/11/2018    PCP: Asencion Noble, MD  REFERRING PROVIDER:   Honor Junes, MD    REFERRING DIAG: [phone] (ICD-10-CM) - Post-traumatic osteoarthritis, left shoulder   THERAPY DIAG:  Chronic left shoulder pain  Stiffness of left shoulder, not elsewhere classified  Post-traumatic osteoarthritis of left shoulder  Acute pain of left shoulder  Other symptoms and signs involving the musculoskeletal  system  Rationale for Evaluation and Treatment Rehabilitation  ONSET DATE: 04/505/2023  SUBJECTIVE:                                                                                                                                                                                      SUBJECTIVE STATEMENT: Not having any pain today.  Overall doing well. Cooking, dressing and showering is all easier.   [place]; fracture pelvis, tailbone and left shoulder;  continued pain. Went to [facility] ER after her fall. When she had continued pain and difficulty he recommended rTSA; she reports some pain in her right non surgical shoulder today.    PERTINENT HISTORY: S/P reverse total shoulder arthroplasty, left 08/07/2021  Nontraumatic rupture of left long head biceps tendon   Post-traumatic osteoarthritis of left shoulder   Rotator cuff dysfunction, left   Nontraumatic rupture of left long head biceps tendon [M66.322]   Post-traumatic osteoarthritis of left shoulder [M19.112]   Rotator cuff dysfunction, left [M67.912]     Procedures   PR RECONSTR TOTAL SHOULDER IMPLANT   TOTAL SHOULDER REVERSE ARTHROPLASTY    Previous left shoulder fracture 5 years ago  osteoporosis  PAIN:  Are you having pain? No  PRECAUTIONS: Fall  WEIGHT BEARING RESTRICTIONS No  FALLS:  Has patient fallen in last 6 months? Yes. Number of falls 1  LIVING ENVIRONMENT: Lives with: lives alone Lives in: House/apartment Stairs: Yes: Internal: 13 steps; can reach both and External: 4 steps; none Has following equipment at home: Single point cane, Walker - 2 wheeled, Wheelchair (manual), shower chair, bed side commode, and Ramped entry  OCCUPATION: retired  PLOF: [place] return to plof  OBJECTIVE:   DIAGNOSTIC FINDINGS:  Done at [place]:  Quick Dash 42  COGNITION:  Overall cognitive status: Within functional limits for tasks  assessed       UPPER EXTREMITY ROM:   Active ROM Right eval Left eval Left PROM 11/19/21 Left AROM 12/05/21  Shoulder flexion Sitting 160 ~40 deg with substitution in sitting Supine 74 degrees (scaption) 89 Supine 102 Sitting 88  Shoulder extension      Shoulder abduction   AROM 70, PROM 85   Shoulder adduction      Shoulder internal rotation  To approximate trunk    Shoulder external rotation  30    Elbow flexion  wfl    Elbow extension  wfl    Wrist flexion      Wrist extension      Wrist ulnar deviation      Wrist radial deviation      Wrist pronation      Wrist supination      (Blank rows = not tested)  UPPER EXTREMITY MMT:  MMT Right eval Left eval  Shoulder flexion    Shoulder extension    Shoulder abduction    Shoulder adduction    Shoulder internal rotation    Shoulder external rotation    Middle trapezius    Lower trapezius    Elbow flexion  3+  Elbow extension  3+  Wrist flexion    Wrist extension    Wrist ulnar deviation    Wrist radial deviation    Wrist pronation    Wrist supination    Grip strength (lbs) right hand dominant 43# 25#  (Blank rows = not tested)   PALPATION:  Soreness only anterior shoulder   TODAY'S TREATMENT:  12/05/21 UBE level 2 forward 3' back 3' Pulley 2'/2' flexion and abduction  Seated: Left Shoulder punches x 10 Left Wall slides x 10  Supine: Left Shoulder flexion 2 x 10 3# chest press with bar x 10  Sidelying: Left Shoulder abduction 2 x 10 Left Shoulder external rotation 2 x 10  Standing: Bent over shoulder extension and rows 2# 2 x 10  Supine left shoulder flexion AROM 102 today, sitting 88 degrees   12/03/21 Pulley 2' flexion/ 2' abduction UBE 2' Forward/ 2' back  Standing dowel shoulder extension x 10 Standing dowel shoulder internal rotation x 10  Wall slides x 10  Seated shoulder overhead punches with assist 2 x 10  Mat flexion, abduction and external rotation 1 x  10        11/29/21 UBE 2/2 level 1 Pulley 2' flexion 2' abduction  Manual PROM left shoulder for flexion/abduction/ER x 10" Mat  Supine left shoulder flexion 2 x 10 Side lying left shoulder external rotation 2 x 10 Side lying left shoulder abduction 2 x 10  Ball roll on mat table up wedge x 2'  Wall slides x 10  Seated shoulder overhead punches with assist x 5 Seated AROM left shoulder scaption 70 degrees with substition       11/27/21 Manual LT GHJ PROM flexion/ abduction/ ER Manual STM to LT periscapular muscles and upper trap (deltoid, teres, trapezius)  Sidelying shoulder ER x15 Sidelying shoulder abduction x15 Wall slides  UBE 2/2 lv1  11/19/21: Reviewed goals Reviewed HEP with cueing Supine: Dowel AAROM flexion and ER AAROM with Rt UE  PROM 10x 10" all directions   Seated: Scapular retraction Chin tuck 10x cueing for mechanics Pully 2' flex and abd  Table slide 5x10" for flexion and abd  Physical therapy evaluation and HEP instruction   PATIENT EDUCATION:  Education details: Patient educated on exam findings, POC, scope of PT, HEP.  Person educated: Patient Education method: Explanation, Demonstration, and Handouts Education comprehension: verbalized understanding, returned demonstration, verbal cues required, and tactile cues required   HOME EXERCISE PROGRAM:  12/03/21 Dowel shoulder extension and external rotation Access Code: [email or web address] URL: [email or web address] Date: 11/07/2021 Prepared by: [facility]  Exercises - Supine Shoulder Flexion with Dowel  - 2 x daily - 7 x weekly - 1 sets - 10 reps - Supine Shoulder Flexion AAROM with Hands Clasped  - 2 x daily - 7 x weekly - 1 sets - 10 reps - Supine Shoulder External Rotation in 45 Degrees Abduction AAROM with Dowel  - 2 x daily - 7 x weekly - 1 sets - 10 reps - Putty Squeezes  - 1 x daily - 7 x weekly - 1 sets - 1 reps - 5 min hold  ASSESSMENT:  CLINICAL  IMPRESSION: Continued to work on left arm functional mobility and strengthening. She met goals today for supine AROM shoulder flexion; added chest press and bent over rows and extensions for strengthening today.  Continues to substitute with sitting shoulder elevation. Patient will continue to benefit from skilled therapy services to reduce remaining deficits and improve functional ability.    OBJECTIVE IMPAIRMENTS decreased activity tolerance, decreased endurance, decreased mobility, decreased ROM, decreased strength, hypomobility, increased fascial restrictions, impaired perceived functional ability, impaired flexibility, impaired UE functional use, and pain.   ACTIVITY LIMITATIONS carrying, lifting, bathing, toileting, dressing, reach over head, and hygiene/grooming  PARTICIPATION LIMITATIONS: meal prep, cleaning, laundry, driving, shopping, and community activity  PERSONAL FACTORS Age and 1 comorbidity: osteoporosis  are also affecting patient's functional outcome.   REHAB POTENTIAL: Good  CLINICAL DECISION MAKING: Stable/uncomplicated  EVALUATION COMPLEXITY: Low   GOALS: Goals reviewed with patient? No  SHORT TERM GOALS: Target date: 11/28/2021    1.   patient will be independent with initial HEP Baseline: Goal status: met  2.  Patient will improve left shoulder AROM flexion in supine to 90 degrees to improve ability to reach in the refrigerator with her left hand to reach sandwich making ingredients. Baseline: 74, 12/04/21 102 Goal status: met  LONG TERM GOALS: Target date: 12/12/2021  1. Patient will be independent in self management strategies to improve quality of life and functional outcomes.  Baseline:  Goal status: IN PROGRESS  2.  Patient will be able to actively raise left shoulder to 90 degrees in sitting to improve ability to reach into cabinets in the kitchen with left hand.  Baseline: ~40; 12/04/21 88 degrees Goal status: IN PROGRESS  3.  Patient will improve her  qDASH score to 25% or less to demonstrate improved functional mobility.   Baseline: 41 Goal status: IN PROGRESS  4.  Patient will increase left grip strength by 5#'s to improve ability to open jars in the kitchen.  Baseline: 25# Goal status: IN PROGRESS    PLAN: PT FREQUENCY: 2x/week  PT DURATION: 4 weeks  PLANNED INTERVENTIONS: Therapeutic exercises, Therapeutic activity, Neuromuscular re-education, Balance training, Gait training, Patient/Family education, Joint manipulation, Joint mobilization, Stair training, Orthotic/Fit training, DME instructions, Aquatic Therapy, Dry Needling, Electrical stimulation, Spinal manipulation, Spinal mobilization, Cryotherapy, Moist heat, Compression bandaging, scar mobilization, Splintting, Taping, Traction, Ultrasound, Ionotophoresis 74m/ml Dexamethasone, and Manual therapy   PLAN FOR NEXT SESSION: QDASH, grip strength, reassess next visit; 2 visits remain on current orders than likely discharge.   2:43 PM, 12/05/21 Price Lachapelle Small Demiah Gullickson MPT [facility] [place] #[phone]

## 2021-12-10 ENCOUNTER — Encounter (HOSPITAL_COMMUNITY): Payer: Medicare Other | Admitting: Physical Therapy

## 2021-12-11 ENCOUNTER — Ambulatory Visit (HOSPITAL_COMMUNITY): Payer: Medicare Other

## 2021-12-11 DIAGNOSIS — M19112 Post-traumatic osteoarthritis, left shoulder: Secondary | ICD-10-CM

## 2021-12-11 DIAGNOSIS — G8929 Other chronic pain: Secondary | ICD-10-CM | POA: Diagnosis not present

## 2021-12-11 DIAGNOSIS — R29898 Other symptoms and signs involving the musculoskeletal system: Secondary | ICD-10-CM

## 2021-12-11 DIAGNOSIS — M25612 Stiffness of left shoulder, not elsewhere classified: Secondary | ICD-10-CM

## 2021-12-11 DIAGNOSIS — M25512 Pain in left shoulder: Secondary | ICD-10-CM | POA: Diagnosis not present

## 2021-12-11 NOTE — Therapy (Signed)
OUTPATIENT PHYSICAL THERAPY SHOULDER TREATMENT PHYSICAL THERAPY DISCHARGE SUMMARY  Visits from Start of Care: 7  Current functional level related to goals / functional outcomes: See below   Remaining deficits: See below   Education / Equipment: See below   Patient agrees to discharge. Patient goals were met. Patient is being discharged due to meeting the stated rehab goals.    Patient Name: Carol Gutierrez MRN: [id number] DOB:04/26/1936, 86 y.o., female Today's Date: 12/11/2021   PT End of Session - 12/11/21 1113     Visit Number 7    Number of Visits 8    Date for PT Re-Evaluation 12/12/21    Authorization Type Medicare Part A and [employer] suppliment    Progress Note Due on Visit 10    PT Start Time 1113    PT Stop Time 1153    PT Time Calculation (min) 40 min    Activity Tolerance Patient tolerated treatment well    Behavior During Therapy WFL for tasks assessed/performed                  Past Medical History:  Diagnosis Date   Arthritis    hands and back   Hypertension    Past Surgical History:  Procedure Laterality Date   COLONOSCOPY N/A 09/08/2012   Procedure: COLONOSCOPY;  Surgeon: Rogene Houston, MD;  [facility]: [facility];  Service: Endoscopy;  Laterality: N/A;  730-moved to 830 Ann to notify pt   COLONOSCOPY N/A 01/05/2019   Procedure: COLONOSCOPY;  Surgeon: Rogene Houston, MD;  Location: [facility];  Service: Endoscopy;  Laterality: N/A;  100   JOINT REPLACEMENT  2008   left hip   POLYPECTOMY  01/05/2019   Procedure: POLYPECTOMY;  Surgeon: Rogene Houston, MD;  Location: [facility];  Service: Endoscopy;;  colon   TOTAL HIP ARTHROPLASTY Left Left Hip Replacement   Patient Active Problem List   Diagnosis Date Noted   Near syncope 02/08/2020   Pre-syncope 02/07/2020   HTN (hypertension) 02/07/2020   Hx of colonic polyps 11/11/2018   Family hx of colon cancer 11/11/2018    PCP: Asencion Noble, MD  REFERRING PROVIDER:   Honor Junes, MD    REFERRING DIAG: [phone] (ICD-10-CM) - Post-traumatic osteoarthritis, left shoulder   THERAPY DIAG:  Chronic left shoulder pain  Stiffness of left shoulder, not elsewhere classified  Post-traumatic osteoarthritis of left shoulder  Acute pain of left shoulder  Other symptoms and signs involving the musculoskeletal system  Rationale for Evaluation and Treatment Rehabilitation  ONSET DATE: 04/505/2023  SUBJECTIVE:  SUBJECTIVE STATEMENT: No pain left shoulder; "100% better"  Eval:  Fell back in October of last year; fracture pelvis, tailbone and left shoulder; continued pain. Went to [facility] ER after her fall. When she had continued pain and difficulty he recommended rTSA; she reports some pain in her right non surgical shoulder today.    PERTINENT HISTORY: S/P reverse total shoulder arthroplasty, left 08/07/2021  Nontraumatic rupture of left long head biceps tendon   Post-traumatic osteoarthritis of left shoulder   Rotator cuff dysfunction, left   Nontraumatic rupture of left long head biceps tendon [M66.322]   Post-traumatic osteoarthritis of left shoulder [M19.112]   Rotator cuff dysfunction, left [M67.912]     Procedures   PR RECONSTR TOTAL SHOULDER IMPLANT   TOTAL SHOULDER REVERSE ARTHROPLASTY    Previous left shoulder fracture 5 years ago  osteoporosis  PAIN:  Are you having pain? No  PRECAUTIONS: Fall  WEIGHT BEARING RESTRICTIONS No  FALLS:  Has patient fallen in last 6 months? Yes. Number of falls 1  LIVING ENVIRONMENT: Lives with: lives alone Lives in: House/apartment Stairs: Yes: Internal: 13 steps; can reach both and External: 4 steps; none Has following equipment at home: Single point cane, Walker - 2 wheeled, Wheelchair (manual), shower chair, bed side  commode, and Ramped entry  OCCUPATION: retired  PLOF: [place] return to plof  OBJECTIVE:   DIAGNOSTIC FINDINGS:  Done at [place]:  Quick Dash 42  COGNITION:  Overall cognitive status: Within functional limits for tasks assessed       UPPER EXTREMITY ROM:   Active ROM Right eval Left eval Left PROM 11/19/21 Left AROM 12/05/21 Left AROM 12/11/21  Shoulder flexion Sitting 160 ~40 deg with substitution in sitting Supine 74 degrees (scaption) 89 Supine 102 Sitting 88 Sitting 92  Shoulder extension       Shoulder abduction   AROM 70, PROM 85    Shoulder adduction       Shoulder internal rotation  To approximate trunk     Shoulder external rotation  30     Elbow flexion  wfl     Elbow extension  wfl     Wrist flexion       Wrist extension       Wrist ulnar deviation       Wrist radial deviation       Wrist pronation       Wrist supination       (Blank rows = not tested)  UPPER EXTREMITY MMT:  MMT Right eval Left eval Left 12/11/21  Shoulder flexion     Shoulder extension     Shoulder abduction     Shoulder adduction     Shoulder internal rotation     Shoulder external rotation     Middle trapezius     Lower trapezius     Elbow flexion  3+ 4  Elbow extension  3+ 5  Wrist flexion     Wrist extension     Wrist ulnar deviation     Wrist radial deviation     Wrist pronation     Wrist supination     Grip strength (lbs) right hand dominant 43# 25# 35#  (Blank rows = not tested)   PALPATION:  Soreness only anterior shoulder   TODAY'S TREATMENT:   12/11/21  UBE 3' forward 3' back  QDASH 9.1%  Wall slides 2 x 5   Mat exercise  shoulder flexion 2 x 10 Chest press 3#  2 x 10 Side lying abduction 2 x 10 Side lying external rotation 2 x 10  Bent over shoulder extension and rows 2# 2 x 10    12/05/21 UBE level 2 forward 3' back 3' Pulley 2'/2' flexion and abduction  Seated: Left Shoulder punches x 10 Left Wall slides  x 10  Supine: Left Shoulder flexion 2 x 10 3# chest press with bar x 10  Sidelying: Left Shoulder abduction 2 x 10 Left Shoulder external rotation 2 x 10  Standing: Bent over shoulder extension and rows 2# 2 x 10  Supine left shoulder flexion AROM 102 today, sitting 88 degrees   12/03/21 Pulley 2' flexion/ 2' abduction UBE 2' Forward/ 2' back  Standing dowel shoulder extension x 10 Standing dowel shoulder internal rotation x 10  Wall slides x 10  Seated shoulder overhead punches with assist 2 x 10  Mat flexion, abduction and external rotation 1 x 10        11/29/21 UBE 2/2 level 1 Pulley 2' flexion 2' abduction  Manual PROM left shoulder for flexion/abduction/ER x 10" Mat  Supine left shoulder flexion 2 x 10 Side lying left shoulder external rotation 2 x 10 Side lying left shoulder abduction 2 x 10  Ball roll on mat table up wedge x 2'  Wall slides x 10  Seated shoulder overhead punches with assist x 5 Seated AROM left shoulder scaption 70 degrees with substition       11/27/21 Manual LT GHJ PROM flexion/ abduction/ ER Manual STM to LT periscapular muscles and upper trap (deltoid, teres, trapezius)  Sidelying shoulder ER x15 Sidelying shoulder abduction x15 Wall slides  UBE 2/2 lv1  11/19/21: Reviewed goals Reviewed HEP with cueing Supine: Dowel AAROM flexion and ER AAROM with Rt UE  PROM 10x 10" all directions   Seated: Scapular retraction Chin tuck 10x cueing for mechanics Pully 2' flex and abd  Table slide 5x10" for flexion and abd  Physical therapy evaluation and HEP instruction   PATIENT EDUCATION:  Education details: Patient educated on exam findings, POC, scope of PT, HEP.  Person educated: Patient Education method: Explanation, Demonstration, and Handouts Education comprehension: verbalized understanding, returned demonstration, verbal cues required, and tactile cues required   HOME EXERCISE PROGRAM: Access Code:  [email or web address] URL: [email or web address] Date: 12/11/2021 Prepared by: [facility]  Exercises - Standing Bent Over Single Arm Scapular Row with Table Support  - 1 x daily - 7 x weekly - 2 sets - 10 reps - Single Arm Bent Over Shoulder Extension with Dumbbell  - 1 x daily - 7 x weekly - 2 sets - 10 reps  12/03/21 Dowel shoulder extension and external rotation Access Code: [email or web address] URL: [email or web address] Date: 11/07/2021 Prepared by: [facility]  Exercises - Supine Shoulder Flexion with Dowel  - 2 x daily - 7 x weekly - 1 sets - 10 reps - Supine Shoulder Flexion AAROM with Hands Clasped  - 2 x daily - 7 x weekly - 1 sets - 10 reps - Supine Shoulder External Rotation in 45 Degrees Abduction AAROM with Dowel  - 2 x daily - 7 x weekly - 1 sets - 10 reps - Putty Squeezes  - 1 x daily - 7 x weekly - 1 sets - 1 reps - 5 min hold  ASSESSMENT:  CLINICAL IMPRESSION: Progress note today. Patient has met all set rehab goals as of today and is agreeable to discharge.   OBJECTIVE IMPAIRMENTS decreased activity tolerance, decreased endurance, decreased  mobility, decreased ROM, decreased strength, hypomobility, increased fascial restrictions, impaired perceived functional ability, impaired flexibility, impaired UE functional use, and pain.   ACTIVITY LIMITATIONS carrying, lifting, bathing, toileting, dressing, reach over head, and hygiene/grooming  PARTICIPATION LIMITATIONS: meal prep, cleaning, laundry, driving, shopping, and community activity  PERSONAL FACTORS Age and 1 comorbidity: osteoporosis  are also affecting patient's functional outcome.   REHAB POTENTIAL: Good  CLINICAL DECISION MAKING: Stable/uncomplicated  EVALUATION COMPLEXITY: Low   GOALS: Goals reviewed with patient? No  SHORT TERM GOALS: Target date: 11/28/2021    1.   patient will be independent with initial HEP Baseline: Goal status: met  2.  Patient will improve left shoulder AROM flexion in  supine to 90 degrees to improve ability to reach in the refrigerator with her left hand to reach sandwich making ingredients. Baseline: 74, 12/04/21 102 Goal status: met  LONG TERM GOALS: Target date: 12/12/2021  1. Patient will be independent in self management strategies to improve quality of life and functional outcomes.  Baseline:  Goal status: met  2.  Patient will be able to actively raise left shoulder to 90 degrees in sitting to improve ability to reach into cabinets in the kitchen with left hand.  Baseline: ~40; 12/04/21 88 degrees, 12/11/21 92 Goal status: met  3.  Patient will improve her qDASH score to 25% or less to demonstrate improved functional mobility.   Baseline: 41, 12/11/21 9.1% Goal status: met  4.  Patient will increase left grip strength by 5#'s to improve ability to open jars in the kitchen.  Baseline: 25#, 12/11/21 35# Goal status: met    PLAN: PT FREQUENCY: 2x/week  PT DURATION: 4 weeks  PLANNED INTERVENTIONS: Therapeutic exercises, Therapeutic activity, Neuromuscular re-education, Balance training, Gait training, Patient/Family education, Joint manipulation, Joint mobilization, Stair training, Orthotic/Fit training, DME instructions, Aquatic Therapy, Dry Needling, Electrical stimulation, Spinal manipulation, Spinal mobilization, Cryotherapy, Moist heat, Compression bandaging, scar mobilization, Splintting, Taping, Traction, Ultrasound, Ionotophoresis 79m/ml Dexamethasone, and Manual therapy   PLAN FOR NEXT SESSION:  discharge.   11:54 AM, 12/11/21 Mayah Urquidi Small Yocelin Vanlue MPT [facility] [place] #[phone]

## 2021-12-12 ENCOUNTER — Ambulatory Visit (HOSPITAL_COMMUNITY): Payer: Medicare Other

## 2022-01-13 DIAGNOSIS — N3946 Mixed incontinence: Secondary | ICD-10-CM | POA: Diagnosis not present

## 2022-01-13 DIAGNOSIS — N3281 Overactive bladder: Secondary | ICD-10-CM | POA: Diagnosis not present

## 2022-01-27 DIAGNOSIS — N3946 Mixed incontinence: Secondary | ICD-10-CM | POA: Diagnosis not present

## 2022-02-26 DIAGNOSIS — Z23 Encounter for immunization: Secondary | ICD-10-CM | POA: Diagnosis not present

## 2022-03-03 DIAGNOSIS — M25511 Pain in right shoulder: Secondary | ICD-10-CM | POA: Diagnosis not present

## 2022-03-03 DIAGNOSIS — Z471 Aftercare following joint replacement surgery: Secondary | ICD-10-CM | POA: Diagnosis not present

## 2022-03-03 DIAGNOSIS — Z96612 Presence of left artificial shoulder joint: Secondary | ICD-10-CM | POA: Diagnosis not present

## 2022-04-01 DIAGNOSIS — M810 Age-related osteoporosis without current pathological fracture: Secondary | ICD-10-CM | POA: Diagnosis not present

## 2022-04-01 DIAGNOSIS — Z5181 Encounter for therapeutic drug level monitoring: Secondary | ICD-10-CM | POA: Diagnosis not present

## 2022-05-22 DIAGNOSIS — Z23 Encounter for immunization: Secondary | ICD-10-CM | POA: Diagnosis not present

## 2022-06-13 DIAGNOSIS — Z96612 Presence of left artificial shoulder joint: Secondary | ICD-10-CM | POA: Diagnosis not present

## 2022-06-13 DIAGNOSIS — Z471 Aftercare following joint replacement surgery: Secondary | ICD-10-CM | POA: Diagnosis not present

## 2022-07-02 DIAGNOSIS — N3000 Acute cystitis without hematuria: Secondary | ICD-10-CM | POA: Diagnosis not present

## 2022-07-22 DIAGNOSIS — N3946 Mixed incontinence: Secondary | ICD-10-CM | POA: Diagnosis not present

## 2022-07-22 DIAGNOSIS — N958 Other specified menopausal and perimenopausal disorders: Secondary | ICD-10-CM | POA: Diagnosis not present

## 2022-09-02 DIAGNOSIS — N390 Urinary tract infection, site not specified: Secondary | ICD-10-CM | POA: Diagnosis not present

## 2022-09-16 DIAGNOSIS — N3281 Overactive bladder: Secondary | ICD-10-CM | POA: Diagnosis not present

## 2022-09-30 DIAGNOSIS — Z01818 Encounter for other preprocedural examination: Secondary | ICD-10-CM | POA: Diagnosis not present

## 2022-09-30 DIAGNOSIS — N3946 Mixed incontinence: Secondary | ICD-10-CM | POA: Diagnosis not present

## 2022-09-30 DIAGNOSIS — R351 Nocturia: Secondary | ICD-10-CM | POA: Diagnosis not present

## 2022-09-30 DIAGNOSIS — M810 Age-related osteoporosis without current pathological fracture: Secondary | ICD-10-CM | POA: Diagnosis not present

## 2022-09-30 DIAGNOSIS — Z9181 History of falling: Secondary | ICD-10-CM | POA: Diagnosis not present

## 2022-09-30 DIAGNOSIS — Z87310 Personal history of (healed) osteoporosis fracture: Secondary | ICD-10-CM | POA: Diagnosis not present

## 2022-09-30 DIAGNOSIS — I63511 Cerebral infarction due to unspecified occlusion or stenosis of right middle cerebral artery: Secondary | ICD-10-CM | POA: Diagnosis not present

## 2022-09-30 DIAGNOSIS — Z96641 Presence of right artificial hip joint: Secondary | ICD-10-CM | POA: Diagnosis not present

## 2022-09-30 DIAGNOSIS — N3944 Nocturnal enuresis: Secondary | ICD-10-CM | POA: Diagnosis not present

## 2022-09-30 DIAGNOSIS — Z5181 Encounter for therapeutic drug level monitoring: Secondary | ICD-10-CM | POA: Diagnosis not present

## 2022-10-15 DIAGNOSIS — I10 Essential (primary) hypertension: Secondary | ICD-10-CM | POA: Diagnosis not present

## 2022-10-15 DIAGNOSIS — Z79899 Other long term (current) drug therapy: Secondary | ICD-10-CM | POA: Diagnosis not present

## 2022-10-15 DIAGNOSIS — E785 Hyperlipidemia, unspecified: Secondary | ICD-10-CM | POA: Diagnosis not present

## 2022-10-15 DIAGNOSIS — M810 Age-related osteoporosis without current pathological fracture: Secondary | ICD-10-CM | POA: Diagnosis not present

## 2022-10-15 DIAGNOSIS — I6300 Cerebral infarction due to thrombosis of unspecified precerebral artery: Secondary | ICD-10-CM | POA: Diagnosis not present

## 2022-10-15 DIAGNOSIS — D509 Iron deficiency anemia, unspecified: Secondary | ICD-10-CM | POA: Diagnosis not present

## 2022-10-20 DIAGNOSIS — R112 Nausea with vomiting, unspecified: Secondary | ICD-10-CM | POA: Diagnosis not present

## 2022-10-21 DIAGNOSIS — Z823 Family history of stroke: Secondary | ICD-10-CM | POA: Diagnosis not present

## 2022-10-21 DIAGNOSIS — M810 Age-related osteoporosis without current pathological fracture: Secondary | ICD-10-CM | POA: Diagnosis not present

## 2022-10-21 DIAGNOSIS — I10 Essential (primary) hypertension: Secondary | ICD-10-CM | POA: Diagnosis not present

## 2022-10-21 DIAGNOSIS — I471 Supraventricular tachycardia, unspecified: Secondary | ICD-10-CM | POA: Diagnosis not present

## 2022-10-21 DIAGNOSIS — E785 Hyperlipidemia, unspecified: Secondary | ICD-10-CM | POA: Diagnosis not present

## 2022-10-24 DIAGNOSIS — R112 Nausea with vomiting, unspecified: Secondary | ICD-10-CM | POA: Diagnosis not present

## 2022-11-20 DIAGNOSIS — Z79899 Other long term (current) drug therapy: Secondary | ICD-10-CM | POA: Diagnosis not present

## 2022-11-20 DIAGNOSIS — I10 Essential (primary) hypertension: Secondary | ICD-10-CM | POA: Diagnosis not present

## 2022-12-09 DIAGNOSIS — H26492 Other secondary cataract, left eye: Secondary | ICD-10-CM | POA: Diagnosis not present

## 2022-12-09 DIAGNOSIS — H04123 Dry eye syndrome of bilateral lacrimal glands: Secondary | ICD-10-CM | POA: Diagnosis not present

## 2022-12-09 DIAGNOSIS — H40013 Open angle with borderline findings, low risk, bilateral: Secondary | ICD-10-CM | POA: Diagnosis not present

## 2022-12-09 DIAGNOSIS — H35033 Hypertensive retinopathy, bilateral: Secondary | ICD-10-CM | POA: Diagnosis not present

## 2022-12-24 DIAGNOSIS — R112 Nausea with vomiting, unspecified: Secondary | ICD-10-CM | POA: Diagnosis not present

## 2022-12-24 DIAGNOSIS — R194 Change in bowel habit: Secondary | ICD-10-CM | POA: Diagnosis not present

## 2022-12-30 DIAGNOSIS — N3281 Overactive bladder: Secondary | ICD-10-CM | POA: Diagnosis not present

## 2022-12-30 DIAGNOSIS — N3941 Urge incontinence: Secondary | ICD-10-CM | POA: Diagnosis not present

## 2023-01-08 DIAGNOSIS — A499 Bacterial infection, unspecified: Secondary | ICD-10-CM | POA: Diagnosis not present

## 2023-01-08 DIAGNOSIS — N390 Urinary tract infection, site not specified: Secondary | ICD-10-CM | POA: Diagnosis not present

## 2023-01-12 DIAGNOSIS — N3281 Overactive bladder: Secondary | ICD-10-CM | POA: Diagnosis not present

## 2023-01-12 DIAGNOSIS — A499 Bacterial infection, unspecified: Secondary | ICD-10-CM | POA: Diagnosis not present

## 2023-01-12 DIAGNOSIS — N390 Urinary tract infection, site not specified: Secondary | ICD-10-CM | POA: Diagnosis not present

## 2023-01-19 DIAGNOSIS — A499 Bacterial infection, unspecified: Secondary | ICD-10-CM | POA: Diagnosis not present

## 2023-01-19 DIAGNOSIS — N3946 Mixed incontinence: Secondary | ICD-10-CM | POA: Diagnosis not present

## 2023-01-19 DIAGNOSIS — N3281 Overactive bladder: Secondary | ICD-10-CM | POA: Diagnosis not present

## 2023-01-19 DIAGNOSIS — N390 Urinary tract infection, site not specified: Secondary | ICD-10-CM | POA: Diagnosis not present

## 2023-02-23 DIAGNOSIS — Z23 Encounter for immunization: Secondary | ICD-10-CM | POA: Diagnosis not present

## 2023-04-06 DIAGNOSIS — Z5181 Encounter for therapeutic drug level monitoring: Secondary | ICD-10-CM | POA: Diagnosis not present

## 2023-04-06 DIAGNOSIS — M810 Age-related osteoporosis without current pathological fracture: Secondary | ICD-10-CM | POA: Diagnosis not present

## 2023-04-23 DIAGNOSIS — D510 Vitamin B12 deficiency anemia due to intrinsic factor deficiency: Secondary | ICD-10-CM | POA: Diagnosis not present

## 2023-04-23 DIAGNOSIS — I10 Essential (primary) hypertension: Secondary | ICD-10-CM | POA: Diagnosis not present

## 2023-04-23 DIAGNOSIS — Z8673 Personal history of transient ischemic attack (TIA), and cerebral infarction without residual deficits: Secondary | ICD-10-CM | POA: Diagnosis not present
